# Patient Record
Sex: Female | Born: 1995
Health system: Southern US, Community
[De-identification: ages and names within clinical notes are randomized; demographics above are authoritative.]

## PROBLEM LIST (undated history)

## (undated) ENCOUNTER — Inpatient Hospital Stay (HOSPITAL_COMMUNITY): Payer: Self-pay

## (undated) DIAGNOSIS — O039 Complete or unspecified spontaneous abortion without complication: Secondary | ICD-10-CM

## (undated) DIAGNOSIS — S83106A Unspecified dislocation of unspecified knee, initial encounter: Secondary | ICD-10-CM

## (undated) DIAGNOSIS — F419 Anxiety disorder, unspecified: Secondary | ICD-10-CM

## (undated) HISTORY — PX: NO PAST SURGERIES: SHX2092

---
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
## 2016-05-03 ENCOUNTER — Encounter: Payer: Self-pay | Admitting: *Deleted

## 2016-05-03 ENCOUNTER — Ambulatory Visit (INDEPENDENT_AMBULATORY_CARE_PROVIDER_SITE_OTHER): Payer: Medicaid Other | Admitting: *Deleted

## 2016-05-03 DIAGNOSIS — Z3201 Encounter for pregnancy test, result positive: Secondary | ICD-10-CM | POA: Diagnosis not present

## 2016-05-03 DIAGNOSIS — Z3490 Encounter for supervision of normal pregnancy, unspecified, unspecified trimester: Secondary | ICD-10-CM

## 2016-05-03 LAB — POCT PREGNANCY, URINE: Preg Test, Ur: POSITIVE — AB

## 2016-05-03 NOTE — Progress Notes (Signed)
Patient presenting for pregnancy test which is positive. Reviewed allergies and medications, recommended starting prenatal vitamins. Proof of pregnancy letter given.

## 2016-05-10 ENCOUNTER — Inpatient Hospital Stay (HOSPITAL_COMMUNITY)
Admission: AD | Admit: 2016-05-10 | Discharge: 2016-05-10 | Disposition: A | Discharge status: Home / Self Care | Payer: Medicaid Other | Source: Ambulatory Visit | Attending: Family Medicine | Admitting: Family Medicine

## 2016-05-10 ENCOUNTER — Inpatient Hospital Stay (HOSPITAL_COMMUNITY): Payer: Medicaid Other

## 2016-05-10 ENCOUNTER — Encounter: Payer: Self-pay | Admitting: Student

## 2016-05-10 DIAGNOSIS — Z3A01 Less than 8 weeks gestation of pregnancy: Secondary | ICD-10-CM | POA: Insufficient documentation

## 2016-05-10 DIAGNOSIS — Z8349 Family history of other endocrine, nutritional and metabolic diseases: Secondary | ICD-10-CM | POA: Insufficient documentation

## 2016-05-10 DIAGNOSIS — Z833 Family history of diabetes mellitus: Secondary | ICD-10-CM | POA: Diagnosis not present

## 2016-05-10 DIAGNOSIS — O4691 Antepartum hemorrhage, unspecified, first trimester: Secondary | ICD-10-CM

## 2016-05-10 DIAGNOSIS — O3680X0 Pregnancy with inconclusive fetal viability, not applicable or unspecified: Secondary | ICD-10-CM

## 2016-05-10 DIAGNOSIS — N939 Abnormal uterine and vaginal bleeding, unspecified: Secondary | ICD-10-CM | POA: Diagnosis present

## 2016-05-10 DIAGNOSIS — O200 Threatened abortion: Secondary | ICD-10-CM | POA: Diagnosis not present

## 2016-05-10 DIAGNOSIS — O209 Hemorrhage in early pregnancy, unspecified: Secondary | ICD-10-CM

## 2016-05-10 HISTORY — DX: Anxiety disorder, unspecified: F41.9

## 2016-05-10 LAB — CBC
HCT: 33.8 % — ABNORMAL LOW (ref 36.0–46.0)
HEMOGLOBIN: 11.2 g/dL — AB (ref 12.0–15.0)
MCH: 26.9 pg (ref 26.0–34.0)
MCHC: 33.1 g/dL (ref 30.0–36.0)
MCV: 81.3 fL (ref 78.0–100.0)
PLATELETS: 286 10*3/uL (ref 150–400)
RBC: 4.16 MIL/uL (ref 3.87–5.11)
RDW: 15.1 % (ref 11.5–15.5)
WBC: 4.9 10*3/uL (ref 4.0–10.5)

## 2016-05-10 LAB — URINE MICROSCOPIC-ADD ON

## 2016-05-10 LAB — WET PREP, GENITAL
Sperm: NONE SEEN
Trich, Wet Prep: NONE SEEN
WBC, Wet Prep HPF POC: NONE SEEN
Yeast Wet Prep HPF POC: NONE SEEN

## 2016-05-10 LAB — URINALYSIS, ROUTINE W REFLEX MICROSCOPIC
Glucose, UA: NEGATIVE mg/dL
Ketones, ur: NEGATIVE mg/dL
Leukocytes, UA: NEGATIVE
NITRITE: NEGATIVE
PH: 7 (ref 5.0–8.0)
PROTEIN: 100 mg/dL — AB
SPECIFIC GRAVITY, URINE: 1.025 (ref 1.005–1.030)

## 2016-05-10 LAB — HCG, QUANTITATIVE, PREGNANCY: HCG, BETA CHAIN, QUANT, S: 3529 m[IU]/mL — AB (ref <5)

## 2016-05-10 LAB — ABO/RH: ABO/RH(D): B POS

## 2016-05-10 NOTE — MAU Note (Signed)
Pt states she used the restroom earlier today & noted pink/red bleeding on tissue.  Denies pain.

## 2016-05-10 NOTE — Progress Notes (Signed)
Erin Lawrence NP in earlier to discuss test results and d/c plan. Written and verbal d/c instructions given and understanding voiced 

## 2016-05-10 NOTE — Discharge Instructions (Signed)
Vaginal Bleeding During Pregnancy, First Trimester °A small amount of bleeding (spotting) from the vagina is relatively common in early pregnancy. It usually stops on its own. Various things may cause bleeding or spotting in early pregnancy. Some bleeding may be related to the pregnancy, and some may not. In most cases, the bleeding is normal and is not a problem. However, bleeding can also be a sign of something serious. Be sure to tell your health care provider about any vaginal bleeding right away. °Some possible causes of vaginal bleeding during the first trimester include: °· Infection or inflammation of the cervix. °· Growths (polyps) on the cervix. °· Miscarriage or threatened miscarriage. °· Pregnancy tissue has developed outside of the uterus and in a fallopian tube (tubal pregnancy). °· Tiny cysts have developed in the uterus instead of pregnancy tissue (molar pregnancy). °HOME CARE INSTRUCTIONS  °Watch your condition for any changes. The following actions may help to lessen any discomfort you are feeling: °· Follow your health care provider's instructions for limiting your activity. If your health care provider orders bed rest, you may need to stay in bed and only get up to use the bathroom. However, your health care provider may allow you to continue light activity. °· If needed, make plans for someone to help with your regular activities and responsibilities while you are on bed rest. °· Keep track of the number of pads you use each day, how often you change pads, and how soaked (saturated) they are. Write this down. °· Do not use tampons. Do not douche. °· Do not have sexual intercourse or orgasms until approved by your health care provider. °· If you pass any tissue from your vagina, save the tissue so you can show it to your health care provider. °· Only take over-the-counter or prescription medicines as directed by your health care provider. °· Do not take aspirin because it can make you  bleed. °· Keep all follow-up appointments as directed by your health care provider. °SEEK MEDICAL CARE IF: °· You have any vaginal bleeding during any part of your pregnancy. °· You have cramps or labor pains. °· You have a fever, not controlled by medicine. °SEEK IMMEDIATE MEDICAL CARE IF:  °· You have severe cramps in your back or belly (abdomen). °· You pass large clots or tissue from your vagina. °· Your bleeding increases. °· You feel light-headed or weak, or you have fainting episodes. °· You have chills. °· You are leaking fluid or have a gush of fluid from your vagina. °· You pass out while having a bowel movement. °MAKE SURE YOU: °· Understand these instructions. °· Will watch your condition. °· Will get help right away if you are not doing well or get worse. °  °This information is not intended to replace advice given to you by your health care provider. Make sure you discuss any questions you have with your health care provider. °  °Document Released: 06/01/2005 Document Revised: 08/27/2013 Document Reviewed: 04/29/2013 °Elsevier Interactive Patient Education ©2016 [employer] ° °Pelvic Rest °Pelvic rest is sometimes recommended for women when:  °· The placenta is partially or completely covering the opening of the cervix (placenta previa). °· There is bleeding between the uterine wall and the amniotic sac in the first trimester (subchorionic hemorrhage). °· The cervix begins to open without labor starting (incompetent cervix, cervical insufficiency). °· The labor is too early (preterm labor). °HOME CARE INSTRUCTIONS °· Do not have sexual intercourse, stimulation, or an orgasm. °· Do   not use tampons, douche, or put anything in the vagina. °· Do not lift anything over 10 pounds (4.5 kg). °· Avoid strenuous activity or straining your pelvic muscles. °SEEK MEDICAL CARE IF:  °· You have any vaginal bleeding during pregnancy. Treat this as a potential emergency. °· You have cramping pain felt low in the  stomach (stronger than menstrual cramps). °· You notice vaginal discharge (watery, mucus, or bloody). °· You have a low, dull backache. °· There are regular contractions or uterine tightening. °SEEK IMMEDIATE MEDICAL CARE IF: °You have vaginal bleeding and have placenta previa.  °  °This information is not intended to replace advice given to you by your health care provider. Make sure you discuss any questions you have with your health care provider. °  °Document Released: 12/17/2010 Document Revised: 11/14/2011 Document Reviewed: 02/23/2015 °Elsevier Interactive Patient Education ©2016 Elsevier Inc. ° °

## 2016-05-10 NOTE — MAU Provider Note (Signed)
History     CSN: [id number]  Arrival date and time: 05/10/16 1158   First Provider Initiated Contact with Patient 05/10/16 1358      Chief Complaint  Patient presents with  . Vaginal Bleeding   HPI  Phillipa Morden is a 20 y.o. G1P0 at [redacted]w[redacted]d by LMP who presents with vaginal bleeding & abdominal cramping. Symptoms began this morning. Vaginal bleeding was initially pink spotting on toilet paper but she is now having to wear a pad. Describes as bright red bleeding; no clots. Lower abdominal cramping this morning. Describes as period like cramps. Rates pain 3/10. Has not treated.  Some nausea, no vomiting.  Denies diarrhea, constipation, vaginal discharge, or dysuria. Last intercourse yesterday.    OB History    Gravida Para Term Preterm AB Living   1             SAB TAB Ectopic Multiple Live Births                  Past Medical History:  Diagnosis Date  . Anxiety   . Dislocated knee 2008   R knee    Past Surgical History:  Procedure Laterality Date  . NO PAST SURGERIES      Family History  Problem Relation Age of Onset  . Diabetes Mother   . Thyroid disease Mother     Social History  Substance Use Topics  . Smoking status: Never Smoker  . Smokeless tobacco: Never Used  . Alcohol use No    Allergies: No Known Allergies  Prescriptions Prior to Admission  Medication Sig Dispense Refill Last Dose  . Prenatal Vit-Fe Fumarate-FA (PRENATAL MULTIVITAMIN) TABS tablet Take 1 tablet by mouth daily at 12 noon.   05/09/2016 at Unknown time  . ciprofloxacin (CIPRO) 500 MG tablet Take 1 tablet (500 mg total) by mouth 2 (two) times daily. (Patient not taking: Reported on 05/03/2016) 6 tablet 0 Not Taking  . cyclobenzaprine (FLEXERIL) 5 MG tablet Take 1 tablet (5 mg total) by mouth 3 (three) times daily as needed for muscle spasms. (Patient not taking: Reported on 05/03/2016) 30 tablet 0 Not Taking  . ibuprofen (ADVIL,MOTRIN) 600 MG tablet Take 1 tablet (600 mg total) by mouth every 6  (six) hours as needed. (Patient not taking: Reported on 05/03/2016) 30 tablet 0 Not Taking  . phenazopyridine (PYRIDIUM) 100 MG tablet Take 1 tablet (100 mg total) by mouth 3 (three) times daily as needed for pain. (Patient not taking: Reported on 05/03/2016) 10 tablet 0 Not Taking    Review of Systems  Constitutional: Negative.   Gastrointestinal: Positive for abdominal pain and nausea. Negative for constipation, diarrhea and vomiting.  Genitourinary: Negative for dysuria.       + vaginal bleeding   Physical Exam   Blood pressure 116/77, pulse 84, temperature 98.1 F (36.7 C), temperature source Oral, resp. rate 16, height 5\' 2"  (1.575 m), weight 131 lb (59.4 kg), last menstrual period 04/04/2016.  Physical Exam  Nursing note and vitals reviewed. Constitutional: She is oriented to person, place, and time. She appears well-developed and well-nourished. No distress.  HENT:  Head: Normocephalic and atraumatic.  Eyes: Conjunctivae are normal. Right eye exhibits no discharge. Left eye exhibits no discharge. No scleral icterus.  Neck: Normal range of motion.  Cardiovascular: Normal rate.   Respiratory: Effort normal. No respiratory distress.  GI: Soft. She exhibits no distension. There is no tenderness. There is no rebound.  Genitourinary: Uterus normal. Cervix exhibits no motion tenderness.  Right adnexum displays no mass and no tenderness. Left adnexum displays no mass and no tenderness. There is bleeding (small amount of dark red blood) in the vagina. No vaginal discharge found.  Genitourinary Comments: Cervix closed  Neurological: She is alert and oriented to person, place, and time.  Skin: Skin is warm and dry. She is not diaphoretic.  Psychiatric: She has a normal mood and affect. Her behavior is normal. Judgment and thought content normal.    MAU Course  Procedures Results for orders placed or performed during the hospital encounter of 05/10/16 (from the past 24 hour(s))   Urinalysis, Routine w reflex microscopic (not at [facility])     Status: Abnormal   Collection Time: 05/10/16 12:11 PM  Result Value Ref Range   Color, Urine AMBER (A) YELLOW   APPearance HAZY (A) CLEAR   Specific Gravity, Urine 1.025 1.005 - 1.030   pH 7.0 5.0 - 8.0   Glucose, UA NEGATIVE NEGATIVE mg/dL   Hgb urine dipstick LARGE (A) NEGATIVE   Bilirubin Urine SMALL (A) NEGATIVE   Ketones, ur NEGATIVE NEGATIVE mg/dL   Protein, ur 161 (A) NEGATIVE mg/dL   Nitrite NEGATIVE NEGATIVE   Leukocytes, UA NEGATIVE NEGATIVE  Urine microscopic-add on     Status: Abnormal   Collection Time: 05/10/16 12:11 PM  Result Value Ref Range   Squamous Epithelial / LPF 6-30 (A) NONE SEEN   WBC, UA 0-5 0 - 5 WBC/hpf   RBC / HPF TOO NUMEROUS TO COUNT 0 - 5 RBC/hpf   Bacteria, UA MANY (A) NONE SEEN  CBC     Status: Abnormal   Collection Time: 05/10/16  1:00 PM  Result Value Ref Range   WBC 4.9 4.0 - 10.5 K/uL   RBC 4.16 3.87 - 5.11 MIL/uL   Hemoglobin 11.2 (L) 12.0 - 15.0 g/dL   HCT 09.6 (L) 04.5 - 40.9 %   MCV 81.3 78.0 - 100.0 fL   MCH 26.9 26.0 - 34.0 pg   MCHC 33.1 30.0 - 36.0 g/dL   RDW 81.1 91.4 - 78.2 %   Platelets 286 150 - 400 K/uL  ABO/Rh     Status: None   Collection Time: 05/10/16  1:00 PM  Result Value Ref Range   ABO/RH(D) B POS   hCG, quantitative, pregnancy     Status: Abnormal   Collection Time: 05/10/16  1:00 PM  Result Value Ref Range   hCG, Beta Chain, Quant, S 3,529 (H) <5 mIU/mL  Wet prep, genital     Status: Abnormal   Collection Time: 05/10/16  2:00 PM  Result Value Ref Range   Yeast Wet Prep HPF POC NONE SEEN NONE SEEN   Trich, Wet Prep NONE SEEN NONE SEEN   Clue Cells Wet Prep HPF POC PRESENT (A) NONE SEEN   WBC, Wet Prep HPF POC NONE SEEN NONE SEEN   Sperm NONE SEEN    US Ob Comp Less 14 Wks  Result Date: 05/10/2016 CLINICAL DATA:  Spotting for 1 day. EXAM: OBSTETRIC <14 WK ULTRASOUND TECHNIQUE: Transabdominal ultrasound was performed for evaluation of the gestation  as well as the maternal uterus and adnexal regions. COMPARISON:  None. FINDINGS: Intrauterine gestational sac: Single Yolk sac:  Not visualized Embryo:  Not visualized Cardiac Activity: Not visualized Heart Rate:  bpm MSD: 5.9  mm   5 w   2  d CRL:     mm    w  d  US EDC: Subchorionic hemorrhage:  None visualized. Maternal uterus/adnexae: Corpus luteal cyst on the right. No adnexal masses. Trace free fluid. IMPRESSION: Early intrauterine gestational sac. No yolk sac or fetal pole currently. This could be followed with repeat ultrasound in 10-14 days to ensure expected progression. Electronically Signed   By: Charlett NoseKevin  Dover M.D.   On: 05/10/2016 14:58   [place] Ob Transvaginal  Result Date: 05/10/2016 CLINICAL DATA:  Spotting for 1 day. EXAM: OBSTETRIC <14 WK ULTRASOUND TECHNIQUE: Transabdominal ultrasound was performed for evaluation of the gestation as well as the maternal uterus and adnexal regions. COMPARISON:  None. FINDINGS: Intrauterine gestational sac: Single Yolk sac:  Not visualized Embryo:  Not visualized Cardiac Activity: Not visualized Heart Rate:  bpm MSD: 5.9  mm   5 w   2  d CRL:     mm    w  d                  US EDC: Subchorionic hemorrhage:  None visualized. Maternal uterus/adnexae: Corpus luteal cyst on the right. No adnexal masses. Trace free fluid. IMPRESSION: Early intrauterine gestational sac. No yolk sac or fetal pole currently. This could be followed with repeat ultrasound in 10-14 days to ensure expected progression. Electronically Signed   By: Charlett NoseKevin  Dover M.D.   On: 05/10/2016 14:58    MDM +UPT UA, wet prep, GC/chlamydia, CBC, ABO/Rh, quant hCG, HIV, and US today to rule out ectopic pregnancy B positive Ultrasound shows IUGS, no yolk sac. Small SCH.   Assessment and Plan  A: 1. Pregnancy of unknown anatomic location   2. Vaginal bleeding in pregnancy, first trimester   3. Threatened miscarriage    P; [facility] home [facility]  BHCG Pelvic rest Discussed reasons to return to MAU GC/CT pending  Judeth Hornrin Brookelynne Dimperio 05/10/2016, 1:58 PM

## 2016-05-10 NOTE — Progress Notes (Signed)
Has been depressed recently but does not want to harm self or others.

## 2016-05-10 NOTE — Progress Notes (Signed)
Pt now states is not depressed but had anxiety attack this summer

## 2016-05-11 LAB — GC/CHLAMYDIA PROBE AMP (~~LOC~~) NOT AT ARMC
CHLAMYDIA, DNA PROBE: NEGATIVE
Neisseria Gonorrhea: NEGATIVE

## 2016-05-11 LAB — HIV ANTIBODY (ROUTINE TESTING W REFLEX): HIV Screen 4th Generation wRfx: NONREACTIVE

## 2016-05-12 ENCOUNTER — Other Ambulatory Visit: Payer: Medicaid Other

## 2016-05-12 DIAGNOSIS — O3680X0 Pregnancy with inconclusive fetal viability, not applicable or unspecified: Secondary | ICD-10-CM

## 2016-05-12 LAB — HCG, QUANTITATIVE, PREGNANCY: hCG, Beta Chain, Quant, S: 5630 m[IU]/mL — ABNORMAL HIGH (ref <5)

## 2016-05-12 NOTE — Progress Notes (Unsigned)
Pt had stat hcg level today. Reviewed with Dr. Adrian BlackwaterStinson who recommended that patient have a repeat ultrasound in 10-14 days. Ultrasound scheduled and patient informed of results. She had no further questions at this time.

## 2016-05-19 ENCOUNTER — Inpatient Hospital Stay (HOSPITAL_COMMUNITY): Payer: Medicaid Other

## 2016-05-19 ENCOUNTER — Inpatient Hospital Stay (HOSPITAL_COMMUNITY)
Admission: AD | Admit: 2016-05-19 | Discharge: 2016-05-19 | Disposition: A | Discharge status: Home / Self Care | Payer: Medicaid Other | Source: Ambulatory Visit | Attending: Obstetrics & Gynecology | Admitting: Obstetrics & Gynecology

## 2016-05-19 DIAGNOSIS — O209 Hemorrhage in early pregnancy, unspecified: Secondary | ICD-10-CM

## 2016-05-19 DIAGNOSIS — O200 Threatened abortion: Secondary | ICD-10-CM | POA: Diagnosis not present

## 2016-05-19 DIAGNOSIS — O26899 Other specified pregnancy related conditions, unspecified trimester: Secondary | ICD-10-CM

## 2016-05-19 DIAGNOSIS — R109 Unspecified abdominal pain: Secondary | ICD-10-CM

## 2016-05-19 DIAGNOSIS — Z3A01 Less than 8 weeks gestation of pregnancy: Secondary | ICD-10-CM | POA: Insufficient documentation

## 2016-05-19 LAB — URINE MICROSCOPIC-ADD ON: WBC UA: NONE SEEN WBC/hpf (ref 0–5)

## 2016-05-19 LAB — URINALYSIS, ROUTINE W REFLEX MICROSCOPIC
Bilirubin Urine: NEGATIVE
Glucose, UA: NEGATIVE mg/dL
Ketones, ur: NEGATIVE mg/dL
LEUKOCYTES UA: NEGATIVE
Nitrite: NEGATIVE
PROTEIN: NEGATIVE mg/dL
Specific Gravity, Urine: 1.02 (ref 1.005–1.030)
pH: 7.5 (ref 5.0–8.0)

## 2016-05-19 LAB — CBC
HEMATOCRIT: 33 % — AB (ref 36.0–46.0)
HEMOGLOBIN: 10.9 g/dL — AB (ref 12.0–15.0)
MCH: 26.8 pg (ref 26.0–34.0)
MCHC: 33 g/dL (ref 30.0–36.0)
MCV: 81.3 fL (ref 78.0–100.0)
Platelets: 290 10*3/uL (ref 150–400)
RBC: 4.06 MIL/uL (ref 3.87–5.11)
RDW: 15.5 % (ref 11.5–15.5)
WBC: 5.9 10*3/uL (ref 4.0–10.5)

## 2016-05-19 LAB — HCG, QUANTITATIVE, PREGNANCY: hCG, Beta Chain, Quant, S: 15326 m[IU]/mL — ABNORMAL HIGH (ref <5)

## 2016-05-19 NOTE — MAU Note (Signed)
Pt has been bleeding for a week and two days now, light bleeding now.  Has some abdominal cramping that started yesterday afternoon, rating it a 4/10 today.  She said she passed some tissue this morning when she went to the bathroom.

## 2016-05-19 NOTE — MAU Provider Note (Signed)
History     CSN: [id number]  Arrival date and time: 05/19/16 1119   First Provider Initiated Contact with Patient 05/19/16 1145      Chief Complaint  Patient presents with  . Vaginal Bleeding  . Abdominal Cramping   HPI Erin Ponce is a 20 y.o. G1P0 at [redacted]w[redacted]d by LMP who presents with vaginal bleeding & abdominal cramping. Patient has been followed for vaginal bleeding & pregnancy of unknown anatomy location since 9/5. Reports that the vaginal bleeding was heavier for a day and that she passed a few small clots that she thought may be tissue. VB returned to red spotting since then. Abdominal cramping has since resolved as well.   OB History    Gravida Para Term Preterm AB Living   1             SAB TAB Ectopic Multiple Live Births                  Past Medical History:  Diagnosis Date  . Anxiety   . Dislocated knee 2008   R knee    Past Surgical History:  Procedure Laterality Date  . NO PAST SURGERIES      Family History  Problem Relation Age of Onset  . Diabetes Mother   . Thyroid disease Mother     Social History  Substance Use Topics  . Smoking status: Never Smoker  . Smokeless tobacco: Never Used  . Alcohol use No    Allergies: No Known Allergies  Prescriptions Prior to Admission  Medication Sig Dispense Refill Last Dose  . Prenatal Vit-Fe Fumarate-FA (PRENATAL MULTIVITAMIN) TABS tablet Take 1 tablet by mouth daily at 12 noon.    05/09/2016 at Unknown time    Review of Systems  Constitutional: Negative.   Gastrointestinal: Positive for abdominal pain. Negative for constipation, diarrhea, nausea and vomiting.  Genitourinary: Negative for dysuria.       + vaginal bleeding   Physical Exam   Blood pressure 136/82, pulse 94, temperature 99.2 F (37.3 C), temperature source Oral, resp. rate 17, last menstrual period 04/04/2016.  Physical Exam  Nursing note and vitals reviewed. Constitutional: She is oriented to person, place, and time. She appears  well-developed and well-nourished. No distress.  HENT:  Head: Normocephalic and atraumatic.  Eyes: Conjunctivae are normal. Right eye exhibits no discharge. Left eye exhibits no discharge. No scleral icterus.  Neck: Normal range of motion.  Cardiovascular: Normal rate, regular rhythm and normal heart sounds.   No murmur heard. Respiratory: Effort normal and breath sounds normal. No respiratory distress. She has no wheezes.  GI: Soft. She exhibits no distension. There is no tenderness.  Genitourinary: Right adnexum displays no mass and no tenderness. Left adnexum displays no mass and no tenderness. There is bleeding (small amount of dark red blood, no active bleeding) in the vagina.  Genitourinary Comments: Cervix closed  Neurological: She is alert and oriented to person, place, and time.  Skin: Skin is warm and dry. She is not diaphoretic.  Psychiatric: She has a normal mood and affect. Her behavior is normal. Judgment and thought content normal.    MAU Course  Procedures Results for orders placed or performed during the hospital encounter of 05/19/16 (from the past 24 hour(s))  Urinalysis, Routine w reflex microscopic (not at [facility])     Status: Abnormal   Collection Time: 05/19/16 11:25 AM  Result Value Ref Range   Color, Urine YELLOW YELLOW   APPearance CLEAR CLEAR  Specific Gravity, Urine 1.020 1.005 - 1.030   pH 7.5 5.0 - 8.0   Glucose, UA NEGATIVE NEGATIVE mg/dL   Hgb urine dipstick LARGE (A) NEGATIVE   Bilirubin Urine NEGATIVE NEGATIVE   Ketones, ur NEGATIVE NEGATIVE mg/dL   Protein, ur NEGATIVE NEGATIVE mg/dL   Nitrite NEGATIVE NEGATIVE   Leukocytes, UA NEGATIVE NEGATIVE  Urine microscopic-add on     Status: Abnormal   Collection Time: 05/19/16 11:25 AM  Result Value Ref Range   Squamous Epithelial / LPF 6-30 (A) NONE SEEN   WBC, UA NONE SEEN 0 - 5 WBC/hpf   RBC / HPF 0-5 0 - 5 RBC/hpf   Bacteria, UA MANY (A) NONE SEEN   Urine-Other MUCOUS PRESENT   CBC     Status:  Abnormal   Collection Time: 05/19/16 12:04 PM  Result Value Ref Range   WBC 5.9 4.0 - 10.5 K/uL   RBC 4.06 3.87 - 5.11 MIL/uL   Hemoglobin 10.9 (L) 12.0 - 15.0 g/dL   HCT 30.833.0 (L) 65.736.0 - 84.646.0 %   MCV 81.3 78.0 - 100.0 fL   MCH 26.8 26.0 - 34.0 pg   MCHC 33.0 30.0 - 36.0 g/dL   RDW 96.215.5 95.211.5 - 84.115.5 %   Platelets 290 150 - 400 K/uL  hCG, quantitative, pregnancy     Status: Abnormal   Collection Time: 05/19/16 12:04 PM  Result Value Ref Range   hCG, Beta Chain, Quant, S 15,326 (H) <5 mIU/mL   [place] Ob Transvaginal  Result Date: 05/19/2016 CLINICAL DATA:  Vaginal bleeding for multiple weeks, getting worse. Six weeks and 3 days pregnant by last menstrual period. Quantitative beta HCG 15,326. EXAM: TRANSVAGINAL OB ULTRASOUND TECHNIQUE: Transvaginal ultrasound was performed for complete evaluation of the gestation as well as the maternal uterus, adnexal regions, and pelvic cul-de-sac. COMPARISON:  05/10/2016. FINDINGS: Intrauterine gestational sac: Visualized. Yolk sac:  Not visualized Embryo:  Not visualized MSD: 10.1  mm   5 w   5  d           US EDC: 01/08/2017 Subchorionic hemorrhage: Small amount of fluid containing internal membranes within the endometrium on the left extending adjacent to the gestational sac on the right. Maternal uterus/adnexae: Right ovarian corpus luteum. Normal appearing left ovary. Trace amount of free peritoneal fluid. IMPRESSION: 1. Intrauterine gestational sac on the right with no visible fetal pole. Based on sac measurements, the estimated gestational age is 5 weeks and 5 days. This could represent a normal, early intrauterine gestation. Recommend follow-up quantitative B-HCG levels and follow-up US in 14 days to confirm and assess viability. This recommendation follows SRU consensus guidelines: Diagnostic Criteria for Nonviable Pregnancy Early in the First Trimester. Malva Limes Engl J Med 2013; 324:4010-27; 369:1443-51. 2. Small complicated fluid collection within the endometrial cavity to the  left of the gestational sac. This could represent a laterally extending subchorionic hemorrhage. Electronically Signed   By: Beckie SaltsSteven  Reid M.D.   On: 05/19/2016 14:29    MDM CBC, BHCG, ultrasound VSS, NAD B positive Cervix closed Ultrasound shows IUGS that has grown in size since previous study; no yolk sac; possible SCH. BHCG 15,326 Reviewed ultrasound & labs with Dr. Macon LargeAnyanwu; ok to discharge home; will reschedule f/u ultrasound for 10 days  Assessment and Plan  A: 1. Threatened miscarriage   2. Vaginal bleeding in pregnancy, first trimester   3. Abdominal pain affecting pregnancy     P: Discharge home Pelvic rest Reschedule outpatient ultrasound for the week of 9/25 Discussed  reasons to return to MAU Msg sent to clinic for f/u appt to discuss u/s result  Judeth Horn 05/19/2016, 11:44 AM

## 2016-05-19 NOTE — Discharge Instructions (Signed)
Return to care   If you have heavier bleeding that soaks through more that 2 pads per hour for an hour or more  If you bleed so much that you feel like you might pass out or you do pass out  If you have significant abdominal pain that is not improved with Tylenol   If you develop a fever > 100.5    Threatened Miscarriage A threatened miscarriage occurs when you have vaginal bleeding during your first 20 weeks of pregnancy but the pregnancy has not ended. If you have vaginal bleeding during this time, your health care provider will do tests to make sure you are still pregnant. If the tests show you are still pregnant and the developing baby (fetus) inside your womb (uterus) is still growing, your condition is considered a threatened miscarriage. A threatened miscarriage does not mean your pregnancy will end, but it does increase the risk of losing your pregnancy (complete miscarriage). CAUSES  The cause of a threatened miscarriage is usually not known. If you go on to have a complete miscarriage, the most common cause is an abnormal number of chromosomes in the developing baby. Chromosomes are the structures inside cells that hold all your genetic material. Some causes of vaginal bleeding that do not result in miscarriage include:  Having sex.  Having an infection.  Normal hormone changes of pregnancy.  Bleeding that occurs when an egg implants in your uterus. RISK FACTORS Risk factors for bleeding in early pregnancy include:  Obesity.  Smoking.  Drinking excessive amounts of alcohol or caffeine.  Recreational drug use. SIGNS AND SYMPTOMS  Light vaginal bleeding.  Mild abdominal pain or cramps. DIAGNOSIS  If you have bleeding with or without abdominal pain before 20 weeks of pregnancy, your health care provider will do tests to check whether you are still pregnant. One important test involves using sound waves and a computer (ultrasound) to create images of the inside of your  uterus. Other tests include an internal exam of your vagina and uterus (pelvic exam) and measurement of your baby's heart rate.  You may be diagnosed with a threatened miscarriage if:  Ultrasound testing shows you are still pregnant.  Your baby's heart rate is strong.  A pelvic exam shows that the opening between your uterus and your vagina (cervix) is closed.  Your heart rate and blood pressure are stable.  Blood tests confirm you are still pregnant. TREATMENT  No treatments have been shown to prevent a threatened miscarriage from going on to a complete miscarriage. However, the right home care is important.  HOME CARE INSTRUCTIONS   Make sure you keep all your appointments for prenatal care. This is very important.  Get plenty of rest.  Do not have sex or use tampons if you have vaginal bleeding.  Do not douche.  Do not smoke or use recreational drugs.  Do not drink alcohol.  Avoid caffeine. SEEK MEDICAL CARE IF:  You have light vaginal bleeding or spotting while pregnant.  You have abdominal pain or cramping.  You have a fever. SEEK IMMEDIATE MEDICAL CARE IF:  You have heavy vaginal bleeding.  You have blood clots coming from your vagina.  You have severe low back pain or abdominal cramps.  You have fever, chills, and severe abdominal pain. MAKE SURE YOU:  Understand these instructions.  Will watch your condition.  Will get help right away if you are not doing well or get worse.   This information is not intended to replace  advice given to you by your health care provider. Make sure you discuss any questions you have with your health care provider.   Document Released: 08/22/2005 Document Revised: 08/27/2013 Document Reviewed: 06/18/2013 Elsevier Interactive Patient Education 2016 Elsevier Inc.    Pelvic Rest Pelvic rest is sometimes recommended for women when:   The placenta is partially or completely covering the opening of the cervix (placenta  previa).  There is bleeding between the uterine wall and the amniotic sac in the first trimester (subchorionic hemorrhage).  The cervix begins to open without labor starting (incompetent cervix, cervical insufficiency).  The labor is too early (preterm labor). HOME CARE INSTRUCTIONS  Do not have sexual intercourse, stimulation, or an orgasm.  Do not use tampons, douche, or put anything in the vagina.  Do not lift anything over 10 pounds (4.5 kg).  Avoid strenuous activity or straining your pelvic muscles. SEEK MEDICAL CARE IF:  You have any vaginal bleeding during pregnancy. Treat this as a potential emergency.  You have cramping pain felt low in the stomach (stronger than menstrual cramps).  You notice vaginal discharge (watery, mucus, or bloody).  You have a low, dull backache.  There are regular contractions or uterine tightening. SEEK IMMEDIATE MEDICAL CARE IF: You have vaginal bleeding and have placenta previa.    This information is not intended to replace advice given to you by your health care provider. Make sure you discuss any questions you have with your health care provider.   Document Released: 12/17/2010 Document Revised: 11/14/2011 Document Reviewed: 02/23/2015 Elsevier Interactive Patient Education [employer]

## 2016-05-23 ENCOUNTER — Ambulatory Visit (HOSPITAL_COMMUNITY): Payer: Medicaid Other

## 2016-05-24 ENCOUNTER — Ambulatory Visit (HOSPITAL_COMMUNITY): Admission: RE | Admit: 2016-05-24 | Payer: Medicaid Other | Source: Ambulatory Visit

## 2016-05-30 ENCOUNTER — Ambulatory Visit (HOSPITAL_COMMUNITY)
Admission: RE | Admit: 2016-05-30 | Discharge: 2016-05-30 | Disposition: A | Discharge status: Home / Self Care | Payer: Medicaid Other | Source: Ambulatory Visit | Attending: Student | Admitting: Student

## 2016-05-30 ENCOUNTER — Ambulatory Visit: Payer: Medicaid Other | Admitting: *Deleted

## 2016-05-30 DIAGNOSIS — O26899 Other specified pregnancy related conditions, unspecified trimester: Secondary | ICD-10-CM

## 2016-05-30 DIAGNOSIS — O200 Threatened abortion: Secondary | ICD-10-CM

## 2016-05-30 DIAGNOSIS — O209 Hemorrhage in early pregnancy, unspecified: Secondary | ICD-10-CM

## 2016-05-30 DIAGNOSIS — O283 Abnormal ultrasonic finding on antenatal screening of mother: Secondary | ICD-10-CM | POA: Diagnosis not present

## 2016-05-30 DIAGNOSIS — Z3A01 Less than 8 weeks gestation of pregnancy: Secondary | ICD-10-CM | POA: Insufficient documentation

## 2016-05-30 DIAGNOSIS — Z3200 Encounter for pregnancy test, result unknown: Secondary | ICD-10-CM

## 2016-05-30 DIAGNOSIS — R109 Unspecified abdominal pain: Secondary | ICD-10-CM

## 2016-05-30 NOTE — Progress Notes (Signed)
Patient presenting for f/u ultrasound. Transvaginal u/s complete, shows gestational sac but no yolk sac, fetal pole, or fht. Consulted with Dr Vergie LivingPickens who recommended f/u u/s in 14 days and see a provider afterwards. Discussed this with patient who voiced understanding. U/s scheduled for 10/9 @ 0900.

## 2016-06-13 ENCOUNTER — Emergency Department (HOSPITAL_COMMUNITY): Payer: Medicaid Other

## 2016-06-13 ENCOUNTER — Ambulatory Visit: Payer: Medicaid Other | Admitting: Family Medicine

## 2016-06-13 ENCOUNTER — Ambulatory Visit (HOSPITAL_COMMUNITY): Admission: RE | Admit: 2016-06-13 | Payer: Medicaid Other | Source: Ambulatory Visit

## 2016-06-13 ENCOUNTER — Encounter (HOSPITAL_COMMUNITY): Payer: Self-pay

## 2016-06-13 ENCOUNTER — Emergency Department (HOSPITAL_COMMUNITY)
Admission: EM | Admit: 2016-06-13 | Discharge: 2016-06-13 | Disposition: A | Discharge status: Home / Self Care | Payer: Medicaid Other | Attending: Emergency Medicine | Admitting: Emergency Medicine

## 2016-06-13 DIAGNOSIS — O039 Complete or unspecified spontaneous abortion without complication: Secondary | ICD-10-CM | POA: Diagnosis not present

## 2016-06-13 DIAGNOSIS — Z3A10 10 weeks gestation of pregnancy: Secondary | ICD-10-CM | POA: Diagnosis not present

## 2016-06-13 DIAGNOSIS — O26899 Other specified pregnancy related conditions, unspecified trimester: Secondary | ICD-10-CM

## 2016-06-13 DIAGNOSIS — O209 Hemorrhage in early pregnancy, unspecified: Secondary | ICD-10-CM

## 2016-06-13 DIAGNOSIS — R102 Pelvic and perineal pain: Secondary | ICD-10-CM

## 2016-06-13 MED ORDER — IBUPROFEN 200 MG PO TABS
600.0000 mg | ORAL_TABLET | Freq: Once | ORAL | Status: AC
  Administered 2016-06-13: 600 mg via ORAL
  Filled 2016-06-13: qty 1

## 2016-06-13 MED ORDER — NAPROXEN 500 MG PO TABS: 500.0000 mg | ORAL_TABLET | Freq: Two times a day (BID) | ORAL | 0 refills | Status: DC

## 2016-06-13 NOTE — Discharge Instructions (Signed)
Your ultrasound tonight shows that you have passed the pregnancy. You will need to follow up in the GYN office at [facility] in 2 weeks. They will call you to schedule the appointment. If they have not called you by the end of the week you should call them. If you have problems before then go to the Maternity Admissions area (their emergency room)

## 2016-06-13 NOTE — ED Provider Notes (Signed)
[facility]-EMERGENCY DEPT Provider Note   CSN: [id number] Arrival date & time: 06/13/16  0005     History   Chief Complaint Chief Complaint  Patient presents with  . Abdominal Pain    HPI Erin Ponce is a 20 y.o. G1P0 @ [redacted]w[redacted]d gestation who presents to the ED with abdominal pain and vaginal bleeding. She was evaluated at @ [facility] on 2 occasions for pain in early pregnancy. The first visit 05/19/16 she had an ultrasound that showed a 5 week 5 days IUGS. She returned on 9/25 and the ultrasound showed a 6 week 2 day IUGS but no YS. She is scheduled for f/u ultrasound today at [facility] but due to heavy bleeding and cramping she came to the ED tonight. [name] bleeding heavier than and period and passing clots.   Vaginal Bleeding  Primary symptoms include vaginal bleeding.  Primary symptoms include no dysuria. There has been no fever. This is a new problem. The current episode started 1 to 2 hours ago. The problem occurs constantly. The symptoms occur spontaneously. Her LMP was months ago. Associated symptoms include abdominal pain. Pertinent negatives include no nausea, no vomiting and no light-headedness. She has tried nothing for the symptoms.    Past Medical History:  Diagnosis Date  . Anxiety   . Dislocated knee 2008   R knee    There are no active problems to display for this patient.   Past Surgical History:  Procedure Laterality Date  . NO PAST SURGERIES      OB History    Gravida Para Term Preterm AB Living   1             SAB TAB Ectopic Multiple Live Births                   Home Medications    Prior to Admission medications   Medication Sig Start Date End Date Taking? Authorizing Provider  naproxen (NAPROSYN) 500 MG tablet Take 1 tablet (500 mg total) by mouth 2 (two) times daily. 06/13/16   Billye Pickerel Orlene Och, NP  Prenatal Vit-Fe Fumarate-FA (PRENATAL MULTIVITAMIN) TABS tablet Take 1 tablet by mouth daily at 12 noon.     Historical Provider, MD     Family History Family History  Problem Relation Age of Onset  . Diabetes Mother   . Thyroid disease Mother     Social History Social History  Substance Use Topics  . Smoking status: Never Smoker  . Smokeless tobacco: Never Used  . Alcohol use No     Allergies   Review of patient's allergies indicates no known allergies.   Review of Systems Review of Systems  Constitutional: Negative for chills and fever.  HENT: Negative.   Gastrointestinal: Positive for abdominal pain. Negative for nausea and vomiting.  Genitourinary: Positive for vaginal bleeding. Negative for dysuria.  Musculoskeletal: Negative for back pain.  Skin: Negative for rash.  Neurological: Negative for syncope and light-headedness.  Psychiatric/Behavioral: Negative for confusion.     Physical Exam Updated Vital Signs BP 121/62   Pulse 68   Temp 98.7 F (37.1 C) (Oral)   Resp 16   Ht 5\' 2"  (1.575 m)   Wt 60.3 kg   LMP 04/04/2016 (Exact Date)   SpO2 100%   BMI 24.33 kg/m   Physical Exam  Constitutional: She is oriented to person, place, and time. She appears well-developed and well-nourished. No distress.  Eyes: EOM are normal.  Neck: Neck supple.  Cardiovascular: Normal  rate.   Pulmonary/Chest: Effort normal.  Abdominal: Soft. There is tenderness.  Tender with palpation in the lower abdomen.   Genitourinary:  Genitourinary Comments: External genitalia without lesions, moderate blood vaginal vault, tissue removed from the vault, mild CMT, bilateral adnexal tenderness, uterus without palpable enlargement.   Musculoskeletal: Normal range of motion.  Neurological: She is alert and oriented to person, place, and time. No cranial nerve deficit.  Skin: Skin is warm and dry.  Psychiatric: She has a normal mood and affect. Her behavior is normal.  Nursing note and vitals reviewed.  On arrival to the exam room patient pass what appeared as tissue and blood clots.   ED Treatments / Results   Labs (all labs ordered are listed, but only abnormal results are displayed) Labs Reviewed - No data to display  Radiology US Ob Comp Less 14 Wks  Result Date: 06/13/2016 CLINICAL DATA:  Pregnant patient in first-trimester pregnancy with pelvic pain and vaginal bleeding. EXAM: OBSTETRIC <14 WK [place] AND TRANSVAGINAL OB US TECHNIQUE: Both transabdominal and transvaginal ultrasound examinations were performed for complete evaluation of the gestation as well as the maternal uterus, adnexal regions, and pelvic cul-de-sac. Transvaginal technique was performed to assess early pregnancy. COMPARISON:  Obstetric ultrasound 05/30/2016 as well as prior exams FINDINGS: Intrauterine gestational sac: No longer visualized. Yolk sac:  Not visualized. Embryo:  Not visualized. Maternal uterus/adnexae: Previous intrauterine gestational sac is no longer seen. Endometrium measures 4.7 mm greatest dimension. No significant fluid in the endometrial canal. No endometrial blood flow. Complex cyst in the right ovary measures 1.2 cm, similar in size and appearance from prior exam. The left ovary is normal. No significant pelvic free fluid. IMPRESSION: Findings consistent with failed pregnancy. Previous intrauterine gestational sac is not seen. No abnormal endometrial thickening or hyperemia to suggest retained products. Electronically Signed   By: Rubye Oaks M.D.   On: 06/13/2016 02:24   US Ob Transvaginal  Result Date: 06/13/2016 CLINICAL DATA:  Pregnant patient in first-trimester pregnancy with pelvic pain and vaginal bleeding. EXAM: OBSTETRIC <14 WK [place] AND TRANSVAGINAL OB US TECHNIQUE: Both transabdominal and transvaginal ultrasound examinations were performed for complete evaluation of the gestation as well as the maternal uterus, adnexal regions, and pelvic cul-de-sac. Transvaginal technique was performed to assess early pregnancy. COMPARISON:  Obstetric ultrasound 05/30/2016 as well as prior exams FINDINGS: Intrauterine  gestational sac: No longer visualized. Yolk sac:  Not visualized. Embryo:  Not visualized. Maternal uterus/adnexae: Previous intrauterine gestational sac is no longer seen. Endometrium measures 4.7 mm greatest dimension. No significant fluid in the endometrial canal. No endometrial blood flow. Complex cyst in the right ovary measures 1.2 cm, similar in size and appearance from prior exam. The left ovary is normal. No significant pelvic free fluid. IMPRESSION: Findings consistent with failed pregnancy. Previous intrauterine gestational sac is not seen. No abnormal endometrial thickening or hyperemia to suggest retained products. Electronically Signed   By: Rubye Oaks M.D.   On: 06/13/2016 02:24    Procedures Procedures (including critical care time)  Medications Ordered in ED Medications  ibuprofen (ADVIL,MOTRIN) tablet 600 mg (600 mg Oral Given 06/13/16 0231)   [name] Rh positive Discussed with Thressa Sheller, CNM @ [facility] hospital and patient will f/u there in 2 weeks. She will go there sooner for any problems.   Initial Impression / Assessment and Plan / ED Course  I have reviewed the triage vital signs and the nursing notes.  Pertinent labs & imaging results that were available during  my care of the patient were reviewed by me and considered in my medical decision making (see chart for details).  Clinical Course    Final Clinical Impressions(s) / ED Diagnoses  20 y.o. female with vaginal bleeding in early pregnancy stable for d/c without dizziness and complete SAB on U/S. Discussed with the patient clinical and ultrasound findings and plan of care and all questions fully answered. She agrees with plan.  Final diagnoses:  Complete miscarriage    New Prescriptions New Prescriptions   NAPROXEN (NAPROSYN) 500 MG TABLET    Take 1 tablet (500 mg total) by mouth 2 (two) times daily.     FaisonHope M Xiana Carns, NP 06/13/16 0255    Nira ConnPedro Eduardo Cardama, MD 06/13/16 [id number]

## 2016-06-13 NOTE — ED Triage Notes (Signed)
Pt states that she is [redacted]wk pregnant and stated having bleeding yesterday, bleeding got worse today and abd cramping started suddenly around 1130, some nausea, no vomiting.

## 2016-06-22 ENCOUNTER — Encounter: Payer: Medicaid Other | Admitting: Family

## 2016-06-24 ENCOUNTER — Telehealth: Payer: Self-pay | Admitting: *Deleted

## 2016-06-24 NOTE — Telephone Encounter (Signed)
Returned patient's call. Patient stated she is still having severe cramping, intermittent vaginal bleeding with "gushes" of blood. I told her that at this point the best thing to do is go to the MAU and be evaluated. Patient voiced understanding, stated she is waiting for someone to come home and then she would leave immediately.

## 2016-06-24 NOTE — Telephone Encounter (Signed)
Patient called, left message. Stated had [facility] a week ago, she feels like they ought to have done a D&C. She is still cramping and wants to know if this is normal. [facility] app[facility] on Tuesday [facility]. Please return her call.

## 2016-06-27 ENCOUNTER — Encounter (HOSPITAL_COMMUNITY): Payer: Self-pay | Admitting: Emergency Medicine

## 2016-06-27 ENCOUNTER — Emergency Department (HOSPITAL_COMMUNITY)
Admission: EM | Admit: 2016-06-27 | Discharge: 2016-06-27 | Disposition: A | Discharge status: Home / Self Care | Payer: Medicaid Other | Attending: Emergency Medicine | Admitting: Emergency Medicine

## 2016-06-27 DIAGNOSIS — M542 Cervicalgia: Secondary | ICD-10-CM | POA: Diagnosis present

## 2016-06-27 DIAGNOSIS — Y939 Activity, unspecified: Secondary | ICD-10-CM | POA: Insufficient documentation

## 2016-06-27 DIAGNOSIS — Z79899 Other long term (current) drug therapy: Secondary | ICD-10-CM | POA: Diagnosis not present

## 2016-06-27 DIAGNOSIS — Y999 Unspecified external cause status: Secondary | ICD-10-CM | POA: Diagnosis not present

## 2016-06-27 DIAGNOSIS — R51 Headache: Secondary | ICD-10-CM | POA: Diagnosis not present

## 2016-06-27 DIAGNOSIS — Y92410 Unspecified street and highway as the place of occurrence of the external cause: Secondary | ICD-10-CM | POA: Insufficient documentation

## 2016-06-27 DIAGNOSIS — R1084 Generalized abdominal pain: Secondary | ICD-10-CM | POA: Diagnosis not present

## 2016-06-27 DIAGNOSIS — M549 Dorsalgia, unspecified: Secondary | ICD-10-CM | POA: Diagnosis not present

## 2016-06-27 HISTORY — DX: Complete or unspecified spontaneous abortion without complication: O03.9

## 2016-06-27 LAB — I-STAT BETA HCG BLOOD, ED (MC, WL, AP ONLY): HCG, QUANTITATIVE: 21.2 m[IU]/mL — AB (ref <5)

## 2016-06-27 MED ORDER — ACETAMINOPHEN 500 MG PO TABS
1000.0000 mg | ORAL_TABLET | Freq: Once | ORAL | Status: AC
  Administered 2016-06-27: 1000 mg via ORAL
  Filled 2016-06-27: qty 2
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
## 2016-06-27 NOTE — ED Notes (Signed)
PA at bedside.

## 2016-06-27 NOTE — ED Notes (Signed)
Bed: WA05 Expected date:  Expected time:  Means of arrival:  Comments: Triage 7 
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
## 2016-06-27 NOTE — Discharge Instructions (Signed)
Read the information below.  You may return to the Emergency Department at any time for worsening condition or any new symptoms that concern you.  Take tylenol as needed for pain.  If you develop high fevers, worsening abdominal pain, uncontrolled vomiting, or are unable to tolerate fluids by mouth, return to the ER for a recheck.

## 2016-06-27 NOTE — ED Notes (Signed)
Bed: WTR7 Expected date:  Expected time:  Means of arrival:  Comments: 

## 2016-06-28 ENCOUNTER — Encounter: Payer: Self-pay | Admitting: Obstetrics and Gynecology

## 2016-06-28 ENCOUNTER — Ambulatory Visit (INDEPENDENT_AMBULATORY_CARE_PROVIDER_SITE_OTHER): Payer: Medicaid Other | Admitting: Obstetrics and Gynecology

## 2016-06-28 VITALS — BP 122/85 | HR 82 | Wt 135.6 lb

## 2016-06-28 DIAGNOSIS — O039 Complete or unspecified spontaneous abortion without complication: Secondary | ICD-10-CM

## 2016-06-28 NOTE — Patient Instructions (Signed)
Miscarriage  A miscarriage is the sudden loss of an unborn baby (fetus) before the 20th week of pregnancy. Most miscarriages happen in the first 3 months of pregnancy. Sometimes, it happens before a woman even knows she is pregnant. A miscarriage is also called a "spontaneous miscarriage" or "early pregnancy loss." Having a miscarriage can be an emotional experience. Talk with your caregiver about any questions you may have about miscarrying, the grieving process, and your future pregnancy plans.  CAUSES    Problems with the fetal chromosomes that make it impossible for the baby to develop normally. Problems with the baby's genes or chromosomes are most often the result of errors that occur, by chance, as the embryo divides and grows. The problems are not inherited from the parents.   Infection of the cervix or uterus.    Hormone problems.    Problems with the cervix, such as having an incompetent cervix. This is when the tissue in the cervix is not strong enough to hold the pregnancy.    Problems with the uterus, such as an abnormally shaped uterus, uterine fibroids, or congenital abnormalities.    Certain medical conditions.    Smoking, drinking alcohol, or taking illegal drugs.    Trauma.   Often, the cause of a miscarriage is unknown.   SYMPTOMS    Vaginal bleeding or spotting, with or without cramps or pain.   Pain or cramping in the abdomen or lower back.   Passing fluid, tissue, or blood clots from the vagina.  DIAGNOSIS   Your caregiver will perform a physical exam. You may also have an ultrasound to confirm the miscarriage. Blood or urine tests may also be ordered.  TREATMENT    Sometimes, treatment is not necessary if you naturally pass all the fetal tissue that was in the uterus. If some of the fetus or placenta remains in the body (incomplete miscarriage), tissue left behind may become infected and must be removed. Usually, a dilation and curettage (D and C) procedure is performed.  During a D and C procedure, the cervix is widened (dilated) and any remaining fetal or placental tissue is gently removed from the uterus.   Antibiotic medicines are prescribed if there is an infection. Other medicines may be given to reduce the size of the uterus (contract) if there is a lot of bleeding.   If you have Rh negative blood and your baby was Rh positive, you will need a Rh immunoglobulin shot. This shot will protect any future baby from having Rh blood problems in future pregnancies.  HOME CARE INSTRUCTIONS    Your caregiver may order bed rest or may allow you to continue light activity. Resume activity as directed by your caregiver.   Have someone help with home and family responsibilities during this time.    Keep track of the number of sanitary pads you use each day and how soaked (saturated) they are. Write down this information.    Do not use tampons. Do not douche or have sexual intercourse until approved by your caregiver.    Only take over-the-counter or prescription medicines for pain or discomfort as directed by your caregiver.    Do not take aspirin. Aspirin can cause bleeding.    Keep all follow-up appointments with your caregiver.    If you or your partner have problems with grieving, talk to your caregiver or seek counseling to help cope with the pregnancy loss. Allow enough time to grieve before trying to get pregnant again.     SEEK IMMEDIATE MEDICAL CARE IF:    You have severe cramps or pain in your back or abdomen.   You have a fever.   You pass large blood clots (walnut-sized or larger) ortissue from your vagina. Save any tissue for your caregiver to inspect.    Your bleeding increases.    You have a thick, bad-smelling vaginal discharge.   You become lightheaded, weak, or you faint.    You have chills.   MAKE SURE YOU:   Understand these instructions.   Will watch your condition.   Will get help right away if you are not doing well or get worse.     This  information is not intended to replace advice given to you by your health care provider. Make sure you discuss any questions you have with your health care provider.     Document Released: 02/15/2001 Document Revised: 12/17/2012 Document Reviewed: 10/11/2011  Elsevier Interactive Patient Education 2016 [employer]
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
## 2016-07-01 ENCOUNTER — Other Ambulatory Visit: Payer: Medicaid Other

## 2016-07-05 ENCOUNTER — Telehealth: Payer: Self-pay

## 2016-07-05 NOTE — Telephone Encounter (Signed)
I received a [facility] on the T[facility] Adderall for the pt. She says she is the pt's mother. There is no DPR. I tried to call the pt but the VM is full. She is not a patient here at our office.

## 2016-07-10 ENCOUNTER — Emergency Department (HOSPITAL_COMMUNITY): Payer: Medicaid Other

## 2016-07-10 ENCOUNTER — Encounter (HOSPITAL_COMMUNITY): Payer: Self-pay | Admitting: Emergency Medicine

## 2016-07-10 ENCOUNTER — Emergency Department (HOSPITAL_COMMUNITY)
Admission: EM | Admit: 2016-07-10 | Discharge: 2016-07-11 | Disposition: A | Discharge status: Home / Self Care | Payer: Medicaid Other | Attending: Emergency Medicine | Admitting: Emergency Medicine

## 2016-07-10 DIAGNOSIS — Y929 Unspecified place or not applicable: Secondary | ICD-10-CM | POA: Insufficient documentation

## 2016-07-10 DIAGNOSIS — Y999 Unspecified external cause status: Secondary | ICD-10-CM | POA: Diagnosis not present

## 2016-07-10 DIAGNOSIS — S4991XA Unspecified injury of right shoulder and upper arm, initial encounter: Secondary | ICD-10-CM | POA: Insufficient documentation

## 2016-07-10 DIAGNOSIS — M25511 Pain in right shoulder: Secondary | ICD-10-CM

## 2016-07-10 DIAGNOSIS — Y939 Activity, unspecified: Secondary | ICD-10-CM | POA: Diagnosis not present

## 2016-07-10 LAB — I-STAT BETA HCG BLOOD, ED (MC, WL, AP ONLY): I-stat hCG, quantitative: <5 m[IU]/mL (ref <5)
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
## 2016-07-11 MED ORDER — NAPROXEN 500 MG PO TABS: 500.0000 mg | ORAL_TABLET | Freq: Two times a day (BID) | ORAL | 0 refills | Status: DC

## 2016-07-11 MED ORDER — CYCLOBENZAPRINE HCL 5 MG PO TABS: 5.0000 mg | ORAL_TABLET | Freq: Three times a day (TID) | ORAL | 0 refills | Status: DC | PRN

## 2016-07-11 MED ORDER — CYCLOBENZAPRINE HCL 10 MG PO TABS
5.0000 mg | ORAL_TABLET | Freq: Once | ORAL | Status: AC
  Administered 2016-07-11: 5 mg via ORAL
  Filled 2016-07-11: qty 1

## 2016-07-11 MED ORDER — NAPROXEN 500 MG PO TABS
500.0000 mg | ORAL_TABLET | Freq: Once | ORAL | Status: AC
  Administered 2016-07-11: 500 mg via ORAL
  Filled 2016-07-11: qty 1

## 2016-07-11 NOTE — Discharge Instructions (Signed)
Read the information below.  Your x-rays did not show any obvious fracture or dislocation. You may have a shoulder sprain or strain. You are being placed in a sling. Please wear for the next 2-3 days. Please ice for 20 minute increments for the next 2-3 days. I have prescribed naprosyn and flexeril for pain relief. While taking naprosyn do not take other NSAIDs (motrin, ibuprofen, or aleve). Flexeril can make you drowsy, do not drive after taking. Perform gentle range of motion exercises as tolerated. Please follow up with orthopedics if symptoms persist, I have provided the contact information. You can also follow up with your primary doctor instead if you prefer.   You may return to the Emergency Department at any time for worsening condition or any new symptoms that concern you. Return if you develop fever, loss of sensation, weakness, or any new/concerning symptoms.

## 2016-07-14 ENCOUNTER — Encounter: Payer: Medicaid Other | Admitting: Obstetrics and Gynecology

## 2017-12-04 ENCOUNTER — Other Ambulatory Visit: Payer: Self-pay

## 2017-12-04 ENCOUNTER — Emergency Department (HOSPITAL_COMMUNITY)
Admission: EM | Admit: 2017-12-04 | Discharge: 2017-12-04 | Disposition: A | Discharge status: Home / Self Care | Payer: Medicaid Other | Attending: Emergency Medicine | Admitting: Emergency Medicine

## 2017-12-04 ENCOUNTER — Emergency Department (HOSPITAL_COMMUNITY): Payer: Medicaid Other

## 2017-12-04 ENCOUNTER — Encounter (HOSPITAL_COMMUNITY): Payer: Self-pay

## 2017-12-04 DIAGNOSIS — R55 Syncope and collapse: Secondary | ICD-10-CM

## 2017-12-04 DIAGNOSIS — E876 Hypokalemia: Secondary | ICD-10-CM

## 2017-12-04 DIAGNOSIS — Z79899 Other long term (current) drug therapy: Secondary | ICD-10-CM | POA: Insufficient documentation

## 2017-12-04 DIAGNOSIS — M25511 Pain in right shoulder: Secondary | ICD-10-CM

## 2017-12-04 LAB — I-STAT CHEM 8, ED
BUN: 9 mg/dL (ref 6–20)
CALCIUM ION: 1.12 mmol/L — AB (ref 1.15–1.40)
CHLORIDE: 108 mmol/L (ref 101–111)
Creatinine, Ser: 0.6 mg/dL (ref 0.44–1.00)
Glucose, Bld: 94 mg/dL (ref 65–99)
HEMATOCRIT: 38 % (ref 36.0–46.0)
Hemoglobin: 12.9 g/dL (ref 12.0–15.0)
Potassium: 3.1 mmol/L — ABNORMAL LOW (ref 3.5–5.1)
SODIUM: 143 mmol/L (ref 135–145)
TCO2: 19 mmol/L — AB (ref 22–32)

## 2017-12-04 LAB — I-STAT BETA HCG BLOOD, ED (MC, WL, AP ONLY)

## 2017-12-04 MED ORDER — POTASSIUM CHLORIDE CRYS ER 20 MEQ PO TBCR
40.0000 meq | EXTENDED_RELEASE_TABLET | Freq: Once | ORAL | Status: AC
  Administered 2017-12-04: 40 meq via ORAL
  Filled 2017-12-04: qty 2

## 2017-12-04 NOTE — ED Provider Notes (Signed)
[facility]-EMERGENCY DEPT Provider Note   CSN: [id number] Arrival date & time: 12/04/17  1435     History   Chief Complaint Chief Complaint  Patient presents with  . Shoulder Injury    HPI Erin Ponce is a 22 y.o. right-handed female with a past medical history of anxiety who presents emergency department today for possible shoulder dislocation.  Patient states that since early March when she had a injury to the right shoulder she has been having daily episodes of dislocations.  She states this usually happens with pulling motions and he is able to relax she is able to pop the joint back in within a few minutes.  She states that today she was opening a door when the right shoulder popped out of place.  She became very anxious when he did not pop back and as it usually does.  She was transferred by EMS to the emergency department where she was reported to have a "anxiety attack" per the patient.  EMS reports that she became very anxious and had episode of hyperventilation with brief episodes of syncope x3 that lasted less than 1 minute.  After the patient was given 250 mcg of intranasal fentanyl she states that her pain improved and her anxiety lessened.  No syncope since the event.  Her pain is currently controlled.  She has never seen an orthopedist or doctor for her shoulder.  The patient denies any numbness/tingling distal to the extremity.  The patient denies any associated chest pain, palpitations, shortness breath, abdominal pain, headache, focal neurologic deficits, visual changes, facial droop, vertigo associated with syncope.  Denies fever or URI symptoms.  Denies risk factors for DVT/PE including exogenous estrogen use, recent surgery or travel, trauma, immobilization, smoking, previous blood clot, cough, hemoptysis, cancer, lower extremity pain or swelling, or family history of bleeding/clotting disorder.   HPI  Past Medical History:  Diagnosis Date  .  Anxiety   . Dislocated knee 2008   R knee  . Miscarriage     There are no active problems to display for this patient.   Past Surgical History:  Procedure Laterality Date  . NO PAST SURGERIES       OB History    Gravida  1   Para      Term      Preterm      AB      Living        SAB      TAB      Ectopic      Multiple      Live Births               Home Medications    Prior to Admission medications   Medication Sig Start Date End Date Taking? Authorizing Provider  acetaminophen (TYLENOL) 500 MG tablet Take 500 mg by mouth every 6 (six) hours as needed for mild pain.    [provider]  cyclobenzaprine (FLEXERIL) 5 MG tablet Take 1 tablet (5 mg total) by mouth 3 (three) times daily as needed for muscle spasms. 07/11/16   Deborha Payment, PA-C  naproxen (NAPROSYN) 500 MG tablet Take 1 tablet (500 mg total) by mouth 2 (two) times daily. 07/11/16   Deborha Payment, PA-C  Prenatal Vit-Fe Fumarate-FA (PRENATAL MULTIVITAMIN) TABS tablet Take 1 tablet by mouth daily at 12 noon.     [provider]    Family History Family History  Problem Relation Age of Onset  .  Diabetes Mother   . Thyroid disease Mother     Social History Social History   Tobacco Use  . Smoking status: Never Smoker  . Smokeless tobacco: Never Used  Substance Use Topics  . Alcohol use: No  . Drug use: No     Allergies   Patient has no known allergies.   Review of Systems Review of Systems  All other systems reviewed and are negative.    Physical Exam Updated Vital Signs BP 125/86 (BP Location: Right Arm)   Pulse 98   Temp 98.9 F (37.2 C) (Oral)   Resp 18   Wt 60.3 kg (133 lb)   LMP 09/11/2017   SpO2 99%   Breastfeeding? Yes   BMI 24.33 kg/m   Physical Exam  Constitutional: She appears well-developed and well-nourished.  HENT:  Head: Normocephalic and atraumatic.  Right Ear: External ear normal.  Left Ear: External ear normal.  Nose: Nose  normal.  Mouth/Throat: Uvula is midline, oropharynx is clear and moist and mucous membranes are normal. No tonsillar exudate.  Eyes: Pupils are equal, round, and reactive to light. Right eye exhibits no discharge. Left eye exhibits no discharge. No scleral icterus.  Neck: Trachea normal. Neck supple. No spinous process tenderness present. No neck rigidity. Normal range of motion present.  Cardiovascular: Normal rate, regular rhythm and intact distal pulses.  No murmur heard. Pulses:      Radial pulses are 2+ on the right side, and 2+ on the left side.       Dorsalis pedis pulses are 2+ on the right side, and 2+ on the left side.       Posterior tibial pulses are 2+ on the right side, and 2+ on the left side.  No lower extremity swelling or edema. Calves symmetric in size bilaterally.  Pulmonary/Chest: Effort normal and breath sounds normal. She exhibits no tenderness.  Abdominal: Soft. Bowel sounds are normal. There is no tenderness. There is no rigidity, no rebound, no guarding and no CVA tenderness.  Musculoskeletal: She exhibits no edema.  No C-spine tenderness palpation or step-offs.  Normal range of motion of the cervical spine. Diffuse tenderness over the right shoulder.  No point tenderness.  [name] able to touch the left shoulder with right hand.  Normal strength with abduction.  Limited range of motion secondary to pain.  No overlying erythema, heat or joint swelling Compartments are soft of the right arm. Radial pulse 2+.  Cap refill less than 2 seconds. Intact sensation to light touch of the axillary, medial, ulnar and radial nerves.  Lymphadenopathy:    She has no cervical adenopathy.  Neurological: She is alert.  Skin: Skin is warm and dry. No rash noted. She is not diaphoretic.  Psychiatric: She has a normal mood and affect.  Nursing note and vitals reviewed.    ED Treatments / Results  Labs (all labs ordered are listed, but only abnormal results are displayed) Labs  Reviewed  I-STAT CHEM 8, ED - Abnormal; Notable for the following components:      Result Value   Potassium 3.1 (*)    Calcium, Ion 1.12 (*)    TCO2 19 (*)    All other components within normal limits  I-STAT BETA HCG BLOOD, ED ([facility], [facility], [facility] ONLY)    EKG EKG Interpretation  Date/Time:  Monday December 04 2017 15:36:27 EDT Ventricular Rate:  86 PR Interval:    QRS Duration: 78 QT Interval:  348 QTC Calculation: 417 R Axis:  75 Text Interpretation:  Sinus rhythm Confirmed by Tilden Fossaees, Elizabeth [phone]([id number]) on 12/04/2017 5:42:09 PM   Radiology Dg Shoulder Right  Result Date: 12/04/2017 CLINICAL DATA:  Possible dislocation while reaching for door handle, initial encounter EXAM: RIGHT SHOULDER - 2+ VIEW COMPARISON:  01/09/2010 FINDINGS: There is no evidence of fracture or dislocation. There is no evidence of arthropathy or other focal bone abnormality. Soft tissues are unremarkable. IMPRESSION: No acute abnormality noted. Electronically Signed   By: Alcide CleverMark  Lukens M.D.   On: 12/04/2017 16:40    Procedures Procedures (including critical care time) SPLINT APPLICATION Date/Time: 6:02 PM Authorized by: Jacinto HalimMichael M Kielee Care Consent: Verbal consent obtained. Risks and benefits: risks, benefits and alternatives were discussed Consent given by: patient Splint applied by: orthopedic technician Location details: right shoulder Splint type: right shoulder sling Supplies used: right shoulder sling Post-procedure: The splinted body part was neurovascularly unchanged following the procedure. Patient tolerance: Patient tolerated the procedure well with no immediate complications.   Medications Ordered in ED Medications  potassium chloride SA (K-DUR,KLOR-CON) CR tablet 40 mEq (40 mEq Oral Given 12/04/17 1716)     Initial Impression / Assessment and Plan / ED Course  I have reviewed the triage vital signs and the nursing notes.  Pertinent labs & imaging results that were available during my care of the  patient were reviewed by me and considered in my medical decision making (see chart for details).     22 y.o. female who presents the emergency department today for possible right shoulder dislocation.  She reports that since March injury she has been having daily dislocations but has been able to self reduce these.  She states that she became very anxious after not being able to reduce this today.  On presentation the patient is neurovascularly intact.  There is no obvious deformity and patient is able to touch the right hand to the left shoulder.  No history of trauma.  X-ray negative for dislocation or fracture.  Suspect this was reduced prior to my evaluation.  Do not suspect septic joint.  No further management required at this time.  Placed in a shoulder sling and will be given referral to orthopedics. Specific return precautions discussed. Time was given for all questions to be answered. The patient verbalized understanding and agreement with plan. The patient appears safe for discharge home.  Patient also with 3 <60 second episodes of syncope per EMS which she says is secondary to an anxiety attack.   The patient denies any associated chest pain, palpitations, shortness breath, abdominal pain, headache, focal neurologic deficits, visual changes, facial droop, vertigo associated with syncope.  Denies fever or URI symptoms.  No PE risk factors.  EKG sinus rhythm.  No prolonged QT.  No delta wave.  No ST changes. Patient without arrhythmia or tachycardia while here in the department.  Patient without history of congestive heart failure, normal hematocrit, normal ECG, no shortness of breath and systolic blood pressure greater than 90; patient is low risk. Will plan for discharge home with close PCP follow-up.  Patient does have mildly low potassium.  Replaced orally. Possibility of recurrent syncope has been discussed. I discussed reasons to avoid driving until cardiology/PCP followup and other safety  prevention including use of ladders and working at heights.   Pt has remained hemodynamically stable throughout their time in the ED  BP 131/84 (BP Location: Left Arm)   Pulse 91   Temp 98.1 F (36.7 C) (Oral)   Resp 14   Wt  60.3 kg (133 lb)   LMP 09/11/2017   SpO2 99%   Breastfeeding? Yes   BMI 24.33 kg/m   The patient was discussed with and seen by Dr. Madilyn Hook who agrees with the treatment plan.  Final Clinical Impressions(s) / ED Diagnoses   Final diagnoses:  Acute pain of right shoulder  Syncope, unspecified syncope type  Hypokalemia    ED Discharge Orders    None       Princella Pellegrini 12/04/17 1857    Tilden Fossa, MD 12/05/17 1450

## 2017-12-04 NOTE — ED Notes (Signed)
Bed: [facility]04 Expected date:  Expected time:  Means of arrival:  Comments: EMS dislocation

## 2017-12-04 NOTE — ED Triage Notes (Signed)
Patient BIB EMS from POV- patient was on her way to hospital, but called EMS due to pain. Patient reports she has history of spontaneous R shoulder popping out of place. Patient reports today she was opening a door and her R shoulder popped out of place, but didn't pop itself back in place as it usually does.  250mcg IN Fentanyl administered by EMS. Patient lost consciousness for EMS 3x due to hyperventilation. Patient reports pain 7/10.

## 2017-12-04 NOTE — Discharge Instructions (Addendum)
Your workup was reassuring. There was no evidence of dislocation or fracture. Please follow up with ortho. See attached handout. Please take shoulder out of sling and perform shoulder range of motion exercises. You do not need to wear the sling daily.  You had a syncopal episode. Your workup was reassuring. See attached handout. Follow up with your primary care doctor.  If you develop worsening or new concerning symptoms you can return to the emergency department for re-evaluation.

## 2018-01-07 ENCOUNTER — Emergency Department (HOSPITAL_COMMUNITY)
Admission: EM | Admit: 2018-01-07 | Discharge: 2018-01-07 | Disposition: A | Discharge status: Home / Self Care | Payer: Medicaid Other | Attending: Emergency Medicine | Admitting: Emergency Medicine

## 2018-01-07 ENCOUNTER — Emergency Department (HOSPITAL_COMMUNITY): Payer: Medicaid Other

## 2018-01-07 DIAGNOSIS — Z79899 Other long term (current) drug therapy: Secondary | ICD-10-CM | POA: Insufficient documentation

## 2018-01-07 DIAGNOSIS — M25511 Pain in right shoulder: Secondary | ICD-10-CM | POA: Insufficient documentation

## 2018-01-07 MED ORDER — TRAMADOL HCL 50 MG PO TABS: 50.0000 mg | ORAL_TABLET | Freq: Four times a day (QID) | ORAL | 0 refills | Status: DC | PRN

## 2018-01-07 NOTE — ED Provider Notes (Signed)
[facility] EMERGENCY DEPARTMENT Provider Note   CSN: [id number] Arrival date & time: 01/07/18  0159     History   Chief Complaint Chief Complaint  Patient presents with  . Shoulder Pain    HPI Erin Ponce is a 22 y.o. female.  Plan shoulder injury.  HPI: She has history of previous right shoulder dislocation.  She was doing simple activities at home and felt a pop and pain in her shoulder.  Was unable to move it it was severely painful.  Seen and evaluated by paramedics.  Given intranasal fentanyl.  While being transferred from one stretcher to the other she felt like it relocated.  Her pain is markedly improved.  Past Medical History:  Diagnosis Date  . Anxiety   . Dislocated knee 2008   R knee  . Miscarriage     There are no active problems to display for this patient.   Past Surgical History:  Procedure Laterality Date  . NO PAST SURGERIES       OB History    Gravida  1   Para      Term      Preterm      AB      Living        SAB      TAB      Ectopic      Multiple      Live Births               Home Medications    Prior to Admission medications   Medication Sig Start Date End Date Taking? Authorizing Provider  Prenatal Vit-Fe Fumarate-FA (PRENATAL MULTIVITAMIN) TABS tablet Take 1 tablet by mouth daily at 12 noon.    Yes [provider]  cyclobenzaprine (FLEXERIL) 5 MG tablet Take 1 tablet (5 mg total) by mouth 3 (three) times daily as needed for muscle spasms. Patient not taking: Reported on 12/04/2017 07/11/16   Deborha Payment, PA-C  naproxen (NAPROSYN) 500 MG tablet Take 1 tablet (500 mg total) by mouth 2 (two) times daily. Patient not taking: Reported on 12/04/2017 07/11/16   Deborha Payment, PA-C  traMADol (ULTRAM) 50 MG tablet Take 1 tablet (50 mg total) by mouth every 6 (six) hours as needed. 01/07/18   Rolland Porter, MD    Family History Family History  Problem Relation Age of Onset  . Diabetes Mother     . Thyroid disease Mother     Social History Social History   Tobacco Use  . Smoking status: Never Smoker  . Smokeless tobacco: Never Used  Substance Use Topics  . Alcohol use: No  . Drug use: No     Allergies   Patient has no known allergies.   Review of Systems Review of Systems  Constitutional: Negative for appetite change, chills, diaphoresis, fatigue and fever.  HENT: Negative for mouth sores, sore throat and trouble swallowing.   Eyes: Negative for visual disturbance.  Respiratory: Negative for cough, chest tightness, shortness of breath and wheezing.   Cardiovascular: Negative for chest pain.  Gastrointestinal: Negative for abdominal distention, abdominal pain, diarrhea, nausea and vomiting.  Endocrine: Negative for polydipsia, polyphagia and polyuria.  Genitourinary: Negative for dysuria, frequency and hematuria.  Musculoskeletal: Negative for gait problem.       Right shoulder pain.  Skin: Negative for color change, pallor and rash.  Neurological: Negative for dizziness, syncope, light-headedness and headaches.  Hematological: Does not bruise/bleed easily.  Psychiatric/Behavioral: Negative for behavioral  problems and confusion.     Physical Exam Updated Vital Signs BP 115/82   Pulse 90   Temp 98.4 F (36.9 C) (Oral)   LMP 09/05/2017   SpO2 100%   Physical Exam  Constitutional: She is oriented to person, place, and time. She appears well-developed and well-nourished. No distress.  HENT:  Head: Normocephalic.  Eyes: Pupils are equal, round, and reactive to light. Conjunctivae are normal. No scleral icterus.  Neck: Normal range of motion. Neck supple. No thyromegaly present.  Cardiovascular: Normal rate and regular rhythm. Exam reveals no gallop and no friction rub.  No murmur heard. Pulmonary/Chest: Effort normal and breath sounds normal. No respiratory distress. She has no wheezes. She has no rales.  Abdominal: Soft. Bowel sounds are normal. She  exhibits no distension. There is no tenderness. There is no rebound.  Musculoskeletal: Normal range of motion.  Shoulder palpates to be relocated well within the glenohumeral joint.  Normal axillary nerve sensation.  Neurological: She is alert and oriented to person, place, and time.  Skin: Skin is warm and dry. No rash noted.  Psychiatric: She has a normal mood and affect. Her behavior is normal.     ED Treatments / Results  Labs (all labs ordered are listed, but only abnormal results are displayed) Labs Reviewed - No data to display  EKG None  Radiology Dg Shoulder Right  Result Date: 01/07/2018 CLINICAL DATA:  22 year old female with possible right shoulder dislocation. EXAM: RIGHT SHOULDER - 2+ VIEW COMPARISON:  Right shoulder radiograph dated 12/04/2017 FINDINGS: There is no evidence of fracture or dislocation. There is no evidence of arthropathy or other focal bone abnormality. Soft tissues are unremarkable. IMPRESSION: Negative. Electronically Signed   By: Elgie Collard M.D.   On: 01/07/2018 02:41    Procedures Procedures (including critical care time)  Medications Ordered in ED Medications - No data to display   Initial Impression / Assessment and Plan / ED Course  I have reviewed the triage vital signs and the nursing notes.  Pertinent labs & imaging results that were available during my care of the patient were reviewed by me and considered in my medical decision making (see chart for details).    They show no fracture or other acute abnormality.  Plan will be discharged home.  Limited by pain medication.  Splint.  Increased range of motion.  Final Clinical Impressions(s) / ED Diagnoses   Final diagnoses:  Acute pain of right shoulder    ED Discharge Orders        Ordered    traMADol (ULTRAM) 50 MG tablet  Every 6 hours PRN     01/07/18 0256       Rolland Porter, MD 01/07/18 [id number]

## 2018-01-07 NOTE — Discharge Instructions (Addendum)
Remove sling daily and gently move shoulder through range of motion. Call Dr. Liliane Channel if pain not improving within 7 days.
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
## 2018-01-07 NOTE — ED Notes (Signed)
Patient transported to X-ray 

## 2018-01-12 ENCOUNTER — Encounter (INDEPENDENT_AMBULATORY_CARE_PROVIDER_SITE_OTHER): Payer: Self-pay | Admitting: Orthopaedic Surgery

## 2018-01-12 ENCOUNTER — Ambulatory Visit (INDEPENDENT_AMBULATORY_CARE_PROVIDER_SITE_OTHER): Payer: Medicaid Other | Admitting: Orthopaedic Surgery

## 2018-01-12 ENCOUNTER — Telehealth (INDEPENDENT_AMBULATORY_CARE_PROVIDER_SITE_OTHER): Payer: Self-pay | Admitting: Orthopaedic Surgery

## 2018-01-12 DIAGNOSIS — M25511 Pain in right shoulder: Secondary | ICD-10-CM | POA: Diagnosis not present

## 2018-01-12 DIAGNOSIS — G8929 Other chronic pain: Secondary | ICD-10-CM | POA: Diagnosis not present
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
## 2018-02-02 ENCOUNTER — Other Ambulatory Visit: Payer: Medicaid Other

## 2018-02-02 ENCOUNTER — Inpatient Hospital Stay
Admission: RE | Admit: 2018-02-02 | Discharge: 2018-02-02 | Disposition: A | Discharge status: Home / Self Care | Payer: Medicaid Other | Source: Ambulatory Visit | Attending: Orthopaedic Surgery | Admitting: Orthopaedic Surgery

## 2018-03-26 ENCOUNTER — Emergency Department (HOSPITAL_COMMUNITY)
Admission: EM | Admit: 2018-03-26 | Discharge: 2018-03-26 | Discharge status: Against Medical Advice | Payer: Medicaid Other | Attending: Emergency Medicine | Admitting: Emergency Medicine

## 2018-03-26 DIAGNOSIS — Z1339 Encounter for screening examination for other mental health and behavioral disorders: Secondary | ICD-10-CM | POA: Diagnosis present

## 2018-03-26 DIAGNOSIS — Z5321 Procedure and treatment not carried out due to patient leaving prior to being seen by health care provider: Secondary | ICD-10-CM | POA: Insufficient documentation

## 2018-03-26 NOTE — ED Notes (Signed)
Bed: WA29 Expected date:  Expected time:  Means of arrival:  Comments: EMS-depression

## 2018-03-26 NOTE — ED Notes (Signed)
Helmut MusterAlicia, NT asked patient if she could change into paper scrubs and patient refused. Patient stated that she was just hungry, wanted a sandwich and no longer needed to be here. Charge nurse aware. Patient left prior to signing AMA paperwork. Patient discharged with self, and displayed no signs of instability walking out of emergency department.

## 2018-03-26 NOTE — ED Triage Notes (Signed)
Transported by [facility] from home--mother called EMS and stated that patient has presented "differently" for the last 2 months. 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 Recently moved back in with mom, and told EMS that she feels overwhelmed. Denies SI/HI. Patient also experienced a panic attack PTA with EMS.

## 2018-03-29 ENCOUNTER — Other Ambulatory Visit: Payer: Self-pay

## 2018-03-29 ENCOUNTER — Encounter (HOSPITAL_COMMUNITY): Payer: Self-pay | Admitting: Emergency Medicine

## 2018-03-29 ENCOUNTER — Emergency Department (HOSPITAL_COMMUNITY)
Admission: EM | Admit: 2018-03-29 | Discharge: 2018-03-30 | Disposition: A | Discharge status: Other Acute Inpatient Hospital | Payer: Medicaid Other | Attending: Emergency Medicine | Admitting: Emergency Medicine

## 2018-03-29 DIAGNOSIS — F329 Major depressive disorder, single episode, unspecified: Secondary | ICD-10-CM | POA: Insufficient documentation

## 2018-03-29 DIAGNOSIS — Z79899 Other long term (current) drug therapy: Secondary | ICD-10-CM | POA: Insufficient documentation

## 2018-03-29 DIAGNOSIS — F22 Delusional disorders: Secondary | ICD-10-CM

## 2018-03-29 DIAGNOSIS — F29 Unspecified psychosis not due to a substance or known physiological condition: Secondary | ICD-10-CM | POA: Diagnosis present

## 2018-03-29 LAB — COMPREHENSIVE METABOLIC PANEL
ALBUMIN: 4.2 g/dL (ref 3.5–5.0)
ALK PHOS: 65 U/L (ref 38–126)
ALT: 17 U/L (ref 0–44)
AST: 27 U/L (ref 15–41)
Anion gap: 9 (ref 5–15)
BUN: 9 mg/dL (ref 6–20)
CALCIUM: 9.4 mg/dL (ref 8.9–10.3)
CO2: 27 mmol/L (ref 22–32)
CREATININE: 0.78 mg/dL (ref 0.44–1.00)
Chloride: 103 mmol/L (ref 98–111)
GFR calc Af Amer: >60 mL/min (ref >60)
GFR calc non Af Amer: >60 mL/min (ref >60)
GLUCOSE: 99 mg/dL (ref 70–99)
Potassium: 4.1 mmol/L (ref 3.5–5.1)
SODIUM: 139 mmol/L (ref 135–145)
Total Bilirubin: 0.5 mg/dL (ref 0.3–1.2)
Total Protein: 7 g/dL (ref 6.5–8.1)

## 2018-03-29 LAB — CBC
HCT: 39.5 % (ref 36.0–46.0)
Hemoglobin: 12.5 g/dL (ref 12.0–15.0)
MCH: 27.6 pg (ref 26.0–34.0)
MCHC: 31.6 g/dL (ref 30.0–36.0)
MCV: 87.2 fL (ref 78.0–100.0)
PLATELETS: 315 10*3/uL (ref 150–400)
RBC: 4.53 MIL/uL (ref 3.87–5.11)
RDW: 14.5 % (ref 11.5–15.5)
WBC: 6.6 10*3/uL (ref 4.0–10.5)

## 2018-03-29 LAB — RAPID URINE DRUG SCREEN, HOSP PERFORMED
Amphetamines: NOT DETECTED
BENZODIAZEPINES: NOT DETECTED
Barbiturates: NOT DETECTED
Cocaine: NOT DETECTED
Opiates: NOT DETECTED
Tetrahydrocannabinol: POSITIVE — AB

## 2018-03-29 LAB — ACETAMINOPHEN LEVEL: Acetaminophen (Tylenol), Serum: <10 ug/mL — ABNORMAL LOW (ref 10–30)

## 2018-03-29 LAB — SALICYLATE LEVEL: Salicylate Lvl: <7 mg/dL (ref 2.8–30.0)

## 2018-03-29 LAB — HCG, QUANTITATIVE, PREGNANCY

## 2018-03-29 LAB — ETHANOL: Alcohol, Ethyl (B): <10 mg/dL (ref <10)

## 2018-03-29 MED ORDER — LORAZEPAM 2 MG/ML IJ SOLN
1.0000 mg | Freq: Once | INTRAMUSCULAR | Status: AC | PRN
  Administered 2018-03-29: 1 mg via INTRAMUSCULAR
  Filled 2018-03-29: qty 1

## 2018-03-29 MED ORDER — TRAZODONE HCL 100 MG PO TABS
100.0000 mg | ORAL_TABLET | Freq: Every day | ORAL | Status: DC
  Administered 2018-03-29: 100 mg via ORAL
  Filled 2018-03-29: qty 1

## 2018-03-29 MED ORDER — HYDROXYZINE HCL 25 MG PO TABS
25.0000 mg | ORAL_TABLET | Freq: Three times a day (TID) | ORAL | Status: DC
  Administered 2018-03-29 – 2018-03-30 (×2): 25 mg via ORAL
  Filled 2018-03-29 (×4): qty 1

## 2018-03-29 NOTE — ED Notes (Signed)
Pt oriented to room and unit.  Pt is very bizarre and paranoid.  She believes she is here due to "forgetful episodes"  She is very withdrawn and tearful at times.  15 minute checks and video monitoring in place.

## 2018-03-29 NOTE — ED Notes (Signed)
SBAR Report received from previous nurse. Pt received calm and visible on unit. Pt denies current SI/ HI, A/V H, depression, anxiety, or pain at this time, and appears otherwise stable and free of distress. Pt reminded of camera surveillance, q 15 min rounds, and rules of the milieu. Will continue to assess. 

## 2018-03-29 NOTE — ED Notes (Signed)
Bed: WA31 Expected date:  Expected time:  Means of arrival:  Comments: 

## 2018-03-29 NOTE — ED Provider Notes (Signed)
4:55 PM-I was asked by the psychiatric nurse practitioner to see this patient to initiate involuntary commitment because she is "psychotic and wants to leave."  Approach the patient and she was talking on the phone to someone.  I listened to her talk for about 5 minutes, and she was discussing her concerns about living with her mother and not getting along with her mother.  She was talking about not being able to be a good parent to her infant child, and not knowing what to do with "Marcello Mooressaac."  Patient was having a good conversation with the person on the phone and seemed goal oriented.  One point she talked about not being able to communicate on her mobile phone because it was "hacked."  Patient does not appear to meet criteria for involuntary commitment.  10:30 PM-nursing staff states that the patient has taking her nighttime sedating medications and is currently sleeping.  She has been cooperative on the unit and has no further complaints.  We will continue evaluation and treatment by TTS.   Mancel BaleWentz, Arian Murley, MD 03/30/18 [phone]

## 2018-03-29 NOTE — ED Provider Notes (Signed)
[facility]-EMERGENCY DEPT Provider Note   CSN: [id number] Arrival date & time: 03/29/18  1048     History   Chief Complaint Chief Complaint  Patient presents with  . Depression  . Paranoid    HPI Erin Ponce is a 22 y.o. female.  22 year old female with a past medical history of anxiety presents to the ED today with a complaint of depression, and paranoia.  States she has been feeling like she is not around the same timeline as she was before.  Patient reports she feels this way because she has not seen her daughter and her daughter's father in the same room.  Patient states this started on Saturday after she had gone to the club, she denies drinking but states she feels like she needs a break from her daughter.  Patient states "I love my daughter very much, but I feel like he needed a break sometimes" ,patient states she feels like she should not be talking to people.  Patient denies any suicidal ideations, homicidal ideations, and denies having a plan or a past history of.      Past Medical History:  Diagnosis Date  . Anxiety   . Dislocated knee 2008   R knee  . Miscarriage     There are no active problems to display for this patient.   Past Surgical History:  Procedure Laterality Date  . NO PAST SURGERIES       OB History    Gravida  1   Para      Term      Preterm      AB      Living        SAB      TAB      Ectopic      Multiple      Live Births               Home Medications    Prior to Admission medications   Medication Sig Start Date End Date Taking? Authorizing Provider  IRON, FERROUS SULFATE, PO Take 1 tablet by mouth daily.   Yes [provider]  Omega-3 Fatty Acids (OMEGA-3 FISH OIL PO) Take 1 capsule by mouth daily.   Yes [provider]  Prenatal Vit-Fe Fumarate-FA (PRENATAL MULTIVITAMIN) TABS tablet Take 1 tablet by mouth daily at 12 noon.    Yes [provider]    cyclobenzaprine (FLEXERIL) 5 MG tablet Take 1 tablet (5 mg total) by mouth 3 (three) times daily as needed for muscle spasms. Patient not taking: Reported on 12/04/2017 07/11/16   Deborha Payment, PA-C  naproxen (NAPROSYN) 500 MG tablet Take 1 tablet (500 mg total) by mouth 2 (two) times daily. Patient not taking: Reported on 12/04/2017 07/11/16   Deborha Payment, PA-C  traMADol (ULTRAM) 50 MG tablet Take 1 tablet (50 mg total) by mouth every 6 (six) hours as needed. Patient not taking: Reported on 03/26/2018 01/07/18   Rolland Porter, MD    Family History Family History  Problem Relation Age of Onset  . Diabetes Mother   . Thyroid disease Mother     Social History Social History   Tobacco Use  . Smoking status: Never Smoker  . Smokeless tobacco: Never Used  Substance Use Topics  . Alcohol use: No  . Drug use: Yes    Types: Marijuana    Comment: recently stopped     Allergies   Latex   Review of Systems Review  of Systems  Constitutional: Negative for chills and fever.  HENT: Negative for ear pain and sore throat.   Eyes: Negative for pain and visual disturbance.  Respiratory: Negative for cough and shortness of breath.   Cardiovascular: Negative for chest pain and palpitations.  Gastrointestinal: Negative for abdominal pain and vomiting.  Genitourinary: Negative for dysuria and hematuria.  Musculoskeletal: Negative for arthralgias and back pain.  Skin: Negative for color change and rash.  Neurological: Negative for seizures and syncope.  Psychiatric/Behavioral: Positive for sleep disturbance. Negative for confusion, decreased concentration, hallucinations, self-injury and suicidal ideas. The patient is nervous/anxious.   All other systems reviewed and are negative.    Physical Exam Updated Vital Signs BP (!) 150/98 (BP Location: Left Arm)   Pulse (!) 104   Temp 98.2 F (36.8 C) (Oral)   Resp 19   Ht 5\' 2"  (1.575 m)   Wt 54.4 kg (120 lb)   SpO2 100%   BMI 21.95 kg/m    Physical Exam  Constitutional: She is oriented to person, place, and time. She appears well-developed and well-nourished. No distress.  HENT:  Head: Normocephalic and atraumatic.  Mouth/Throat: Oropharynx is clear and moist. No oropharyngeal exudate.  Eyes: Pupils are equal, round, and reactive to light.  Neck: Normal range of motion.  Cardiovascular: Regular rhythm and normal heart sounds.  Pulmonary/Chest: Effort normal and breath sounds normal. No respiratory distress.  Abdominal: Soft. Bowel sounds are normal. She exhibits no distension. There is no tenderness.  Musculoskeletal: She exhibits no tenderness or deformity.       Right lower leg: She exhibits no edema.       Left lower leg: She exhibits no edema.  Neurological: She is alert and oriented to person, place, and time.  Skin: Skin is warm and dry.  Psychiatric: Judgment normal. Her speech is delayed. She is slowed. She is not actively hallucinating. Thought content is paranoid. Cognition and memory are normal. She does not express impulsivity or inappropriate judgment. She exhibits a depressed mood.  Nursing note and vitals reviewed.    ED Treatments / Results  Labs (all labs ordered are listed, but only abnormal results are displayed) Labs Reviewed  ACETAMINOPHEN LEVEL - Abnormal; Notable for the following components:      Result Value   Acetaminophen (Tylenol), Serum <10 (*)    All other components within normal limits  RAPID URINE DRUG SCREEN, HOSP PERFORMED - Abnormal; Notable for the following components:   Tetrahydrocannabinol POSITIVE (*)    All other components within normal limits  COMPREHENSIVE METABOLIC PANEL  ETHANOL  SALICYLATE LEVEL  CBC  I-STAT BETA HCG BLOOD, ED ([facility], [facility], [facility] ONLY)    EKG None  Radiology No results found.  Procedures Procedures (including critical care time)  Medications Ordered in ED Medications  hydrOXYzine (ATARAX/VISTARIL) tablet 25 mg (25 mg Oral Refused 03/29/18 1225)   traZODone (DESYREL) tablet 100 mg (has no administration in time range)  LORazepam (ATIVAN) injection 1 mg (1 mg Intramuscular Given 03/29/18 1228)     Initial Impression / Assessment and Plan / ED Course  I have reviewed the triage vital signs and the nursing notes.  Pertinent labs & imaging results that were available during my care of the patient were reviewed by me and considered in my medical decision making (see chart for details).     Patient states she feels like she is in another timeline, the only way for her to feel like the time is current is to  see her boyfriend and daughter in a room.Patient states she loves her daughter but also needs a break.Patient denies any SI, or HI at this time. She is having no current medical complaints. I believe it sounds like patient feels stressed, tired and sleep deprived. [name] medically cleared for psychiatry evaluation.   Patient was going to be evaluated by the Psychiatric team but she was given ativan prior to the interview. They will try her at a later time.Pending TTS consult.   Final Clinical Impressions(s) / ED Diagnoses   Final diagnoses:  Paranoia [facility](HCC)    ED Discharge Orders    None       Claude MangesSoto, Starlina Lapre, PA-C 03/29/18 1525    Terrilee FilesButler, Michael C, MD 03/29/18 1800

## 2018-03-29 NOTE — BH Assessment (Signed)
Tele Assessment Note   Patient Name: Erin Ponce MRN: [id number] Referring Physician: Claude MangesSoto, Johana, PA-C Location of Patient: [facility] Location of Provider: Behavioral Health TTS Department  Erin Ponce is an 22 y.o. female present to the [facility] with complaint of depression and paranoia. Patient a poor historian with tangential language and word salad. Patient presented with paranoid thinking and beliefs. Per EDP note 'Patient states she has been feeling like she is not around the same timeline as she was before.  Patient reports she feels this way because she has not seen her daughter and her daughter's father in the same room.  Patient states this started on Saturday after she had gone to the club, she denies drinking but states she feels like she needs a break from her daughter.  Patient states "I love my daughter very much, but I feel like he needed a break sometimes" ,patient states she feels like she should not be talking to people.  Patient denies any suicidal ideations, homicidal ideations, and denies having a plan or a past history of.'    Diagnosis:  F23   Brief psychotic disorder  Past Medical History:  Past Medical History:  Diagnosis Date  . Anxiety   . Dislocated knee 2008   R knee  . Miscarriage     Past Surgical History:  Procedure Laterality Date  . NO PAST SURGERIES      Family History:  Family History  Problem Relation Age of Onset  . Diabetes Mother   . Thyroid disease Mother     Social History:  reports that she has never smoked. She has never used smokeless tobacco. She reports that she has current or past drug history. Drug: Marijuana. She reports that she does not drink alcohol.  Additional Social History:  Alcohol / Drug Use Pain Medications: see MAR Prescriptions: see MAR Over the Counter: see MAR History of alcohol / drug use?: Yes Substance #1 Name of Substance 1: THC 1 - Age of First Use: UTA 1 - Amount (size/oz): unknown 1 - Frequency:  unknown 1 - Duration: unknown  1 - Last Use / Amount: unknown (patient UDS's positive)   CIWA: CIWA-Ar BP: (!) 150/98 Pulse Rate: (!) 104 COWS:    Allergies:  Allergies  Allergen Reactions  . Latex Itching    Home Medications:  (Not in a hospital admission)  OB/GYN Status:  No LMP recorded.  General Assessment Data Assessment unable to be completed: Yes Reason for not completing assessment: Patient given Ativan, unable to awaken.  [facility] of Assessment: [facility] Assessment: In system Is this a Tele or Face-to-Face Assessment?: Face-to-Face Is this an Initial Assessment or a Re-assessment for this encounter?: Initial Assessment Marital status: Single Is patient pregnant?: No Pregnancy Status: No Living Arrangements: Other (Comment)(unknown) Can pt return to current living arrangement?: (UTA) Admission Status: Voluntary Is patient capable of signing voluntary admission?: Yes Referral Source: Self/Family/Friend Insurance type: Medicaid      Crisis Care Plan Living Arrangements: Other (Comment)(unknown) Legal Guardian: Other:(self) Name of Psychiatrist: unknown Name of Therapist: unknown  Education Status Is patient currently in school?: No  Risk to self with the past 6 months Suicidal Ideation: No Has patient been a risk to self within the past 6 months prior to admission? : No Suicidal Intent: No Has patient had any suicidal intent within the past 6 months prior to admission? : No Is patient at risk for suicide?: No Suicidal Plan?: No Has patient had any suicidal plan within the  past 6 months prior to admission? : No Access to Means: No What has been your use of drugs/alcohol within the last 12 months?: THC Previous Attempts/Gestures: No How many times?: 0 Other Self Harm Risks: UTA Triggers for Past Attempts: Unknown Intentional Self Injurious Behavior: None Family Suicide History: Unable to assess Recent stressful life event(s): (UTA) Persecutory  voices/beliefs?: (patient denie) Depression: (patient denies ) Depression Symptoms: (patient denies') Substance abuse history and/or treatment for substance abuse?: No Suicide prevention information given to non-admitted patients: Not applicable  Risk to Others within the past 6 months Homicidal Ideation: No Does patient have any lifetime risk of violence toward others beyond the six months prior to admission? : No Thoughts of Harm to Others: No Current Homicidal Intent: No Current Homicidal Plan: No Access to Homicidal Means: No Identified Victim: n/a History of harm to others?: No Assessment of Violence: None Noted Violent Behavior Description: none noted Does patient have access to weapons?: No Criminal Charges Pending?: No Does patient have a court date: No Is patient on probation?: No  Psychosis Hallucinations: None noted Delusions: None noted  Mental Status Report Appearance/Hygiene: In scrubs Eye Contact: Fair Motor Activity: Freedom of movement Speech: Tangential Level of Consciousness: Alert Mood: Other (Comment)(Bizarre) Affect: Unable to Assess Anxiety Level: (anxious ) Thought Processes: Tangential Judgement: Impaired Orientation: Place Obsessive Compulsive Thoughts/Behaviors: Unable to Assess  Cognitive Functioning Concentration: Decreased Memory: Unable to Assess Is patient IDD: No Is patient DD?: No Insight: Unable to Assess Impulse Control: Unable to Assess Appetite: Fair Have you had any weight changes? : No Change Sleep: Decreased(Patient report she has not been sleeping) Vegetative Symptoms: None  ADLScreening [facility] Assessment Services) Patient's cognitive ability adequate to safely complete daily activities?: Yes Patient able to express need for assistance with ADLs?: Yes Independently performs ADLs?: Yes (appropriate for developmental age)  Prior Inpatient Therapy Prior Inpatient Therapy: (UTA)  Prior Outpatient Therapy Prior Outpatient  Therapy: (UTA)  ADL Screening (condition at time of admission) Patient's cognitive ability adequate to safely complete daily activities?: Yes Is the patient deaf or have difficulty hearing?: No Does the patient have difficulty seeing, even when wearing glasses/contacts?: No Does the patient have difficulty concentrating, remembering, or making decisions?: No Patient able to express need for assistance with ADLs?: Yes Does the patient have difficulty dressing or bathing?: No Independently performs ADLs?: Yes (appropriate for developmental age) Does the patient have difficulty walking or climbing stairs?: No       Abuse/Neglect Assessment (Assessment to be complete while patient is alone) Abuse/Neglect Assessment Can Be Completed: Unable to assess, patient is non-responsive or altered mental status     Advance Directives (For Healthcare) Does Patient Have a Medical Advance Directive?: No Would patient like information on creating a medical advance directive?: No - Patient declined          Disposition:  Disposition Initial Assessment Completed for this Encounter: Kandis Nab, NP, overnight observation )   Ocie Cornfield [facility] 03/29/2018 6:07 PM

## 2018-03-29 NOTE — [facility]HH Counselor (Signed)
Patient given Ativan per Kendal HymenEdie, RN, about 15 minutes ago (12:40pm). Patient unable to be awaken after calling her name multiple times. TTS will attempt to re-assess patient after she awakes.

## 2018-03-29 NOTE — ED Notes (Signed)
Bed: [facility]43 Expected date:  Expected time:  Means of arrival:  Comments: 

## 2018-03-29 NOTE — ED Triage Notes (Signed)
Patient reports feeling like someone is drugging her tea. Pt reports PTSD and states she is stuck in a 2 year time line since she lost her daughter. Pt states she feels like she is burning up from the inside " I think I have lupus" . Pt states she feels like someone is after her family. Pt resistant to answering questions. States she isn't safe and feels she should leave

## 2018-03-30 ENCOUNTER — Other Ambulatory Visit: Payer: Self-pay

## 2018-03-30 ENCOUNTER — Encounter (HOSPITAL_COMMUNITY): Payer: Self-pay | Admitting: *Deleted

## 2018-03-30 ENCOUNTER — Inpatient Hospital Stay (HOSPITAL_COMMUNITY)
Admission: AD | Admit: 2018-03-30 | Discharge: 2018-04-05 | DRG: 885 | Disposition: A | Discharge status: Home / Self Care | Payer: Medicaid Other | Attending: Psychiatry | Admitting: Psychiatry

## 2018-03-30 DIAGNOSIS — F23 Brief psychotic disorder: Secondary | ICD-10-CM | POA: Diagnosis present

## 2018-03-30 DIAGNOSIS — Z62810 Personal history of physical and sexual abuse in childhood: Secondary | ICD-10-CM

## 2018-03-30 DIAGNOSIS — F333 Major depressive disorder, recurrent, severe with psychotic symptoms: Secondary | ICD-10-CM | POA: Diagnosis present

## 2018-03-30 DIAGNOSIS — Z91040 Latex allergy status: Secondary | ICD-10-CM

## 2018-03-30 DIAGNOSIS — F419 Anxiety disorder, unspecified: Secondary | ICD-10-CM | POA: Diagnosis present

## 2018-03-30 DIAGNOSIS — R450 Nervousness: Secondary | ICD-10-CM | POA: Diagnosis not present

## 2018-03-30 DIAGNOSIS — F22 Delusional disorders: Secondary | ICD-10-CM | POA: Diagnosis not present

## 2018-03-30 DIAGNOSIS — F4310 Post-traumatic stress disorder, unspecified: Secondary | ICD-10-CM | POA: Diagnosis present

## 2018-03-30 DIAGNOSIS — G4700 Insomnia, unspecified: Secondary | ICD-10-CM | POA: Diagnosis present

## 2018-03-30 DIAGNOSIS — F29 Unspecified psychosis not due to a substance or known physiological condition: Secondary | ICD-10-CM | POA: Diagnosis present

## 2018-03-30 DIAGNOSIS — R45851 Suicidal ideations: Secondary | ICD-10-CM | POA: Diagnosis not present

## 2018-03-30 MED ORDER — TRAZODONE HCL 50 MG PO TABS
50.0000 mg | ORAL_TABLET | Freq: Every evening | ORAL | Status: DC | PRN
  Administered 2018-03-30: 50 mg via ORAL
  Filled 2018-03-30: qty 1

## 2018-03-30 MED ORDER — MAGNESIUM HYDROXIDE 400 MG/5ML PO SUSP: 30.0000 mL | Freq: Every day | ORAL | Status: DC | PRN

## 2018-03-30 MED ORDER — HYDROXYZINE HCL 25 MG PO TABS
25.0000 mg | ORAL_TABLET | Freq: Three times a day (TID) | ORAL | Status: DC | PRN
  Administered 2018-03-31 – 2018-04-04 (×6): 25 mg via ORAL
  Filled 2018-03-30 (×3): qty 1
  Filled 2018-03-30: qty 4

## 2018-03-30 MED ORDER — LORAZEPAM 2 MG/ML IJ SOLN
1.0000 mg | Freq: Four times a day (QID) | INTRAMUSCULAR | Status: DC | PRN
  Filled 2018-03-30: qty 1

## 2018-03-30 MED ORDER — DIPHENHYDRAMINE HCL 50 MG/ML IJ SOLN: 50.0000 mg | Freq: Two times a day (BID) | INTRAMUSCULAR | Status: DC | PRN

## 2018-03-30 MED ORDER — HALOPERIDOL 5 MG PO TABS
5.0000 mg | ORAL_TABLET | Freq: Four times a day (QID) | ORAL | Status: DC | PRN
  Administered 2018-04-02 – 2018-04-03 (×2): 5 mg via ORAL
  Filled 2018-03-30: qty 1

## 2018-03-30 MED ORDER — ZIPRASIDONE MESYLATE 20 MG IM SOLR: 20.0000 mg | Freq: Two times a day (BID) | INTRAMUSCULAR | Status: DC | PRN

## 2018-03-30 MED ORDER — DIPHENHYDRAMINE HCL 50 MG/ML IJ SOLN: 25.0000 mg | Freq: Four times a day (QID) | INTRAMUSCULAR | Status: DC | PRN

## 2018-03-30 MED ORDER — LORAZEPAM 1 MG PO TABS
1.0000 mg | ORAL_TABLET | Freq: Four times a day (QID) | ORAL | Status: DC | PRN
  Administered 2018-03-30 – 2018-04-02 (×2): 1 mg via ORAL
  Filled 2018-03-30: qty 4
  Filled 2018-03-30: qty 1

## 2018-03-30 MED ORDER — ACETAMINOPHEN 325 MG PO TABS
650.0000 mg | ORAL_TABLET | Freq: Four times a day (QID) | ORAL | Status: DC | PRN
  Administered 2018-04-02: 650 mg via ORAL
  Filled 2018-03-30: qty 2

## 2018-03-30 MED ORDER — HALOPERIDOL LACTATE 5 MG/ML IJ SOLN: 5.0000 mg | Freq: Four times a day (QID) | INTRAMUSCULAR | Status: DC | PRN

## 2018-03-30 MED ORDER — ALUM & MAG HYDROXIDE-SIMETH 200-200-20 MG/5ML PO SUSP: 30.0000 mL | ORAL | Status: DC | PRN

## 2018-03-30 MED ORDER — DIPHENHYDRAMINE HCL 25 MG PO CAPS
25.0000 mg | ORAL_CAPSULE | Freq: Four times a day (QID) | ORAL | Status: DC | PRN
  Administered 2018-04-05: 25 mg via ORAL
  Filled 2018-03-30: qty 1

## 2018-03-30 MED ORDER — LORAZEPAM 2 MG/ML IJ SOLN: 2.0000 mg | Freq: Two times a day (BID) | INTRAMUSCULAR | Status: DC | PRN

## 2018-03-30 NOTE — Progress Notes (Signed)
Did not  Attend group 

## 2018-03-30 NOTE — ED Notes (Signed)
Pt resting in bed with even and unlabored respirations. No distress noted. Will continue to monitor

## 2018-03-30 NOTE — Tx Team (Signed)
Initial Treatment Plan 03/30/2018 7:26 PM Erin Ponce Eye [id number]    PATIENT STRESSORS: Marital or family conflict Substance abuse   PATIENT STRENGTHS: Wellsite geologistCommunication skills General fund of knowledge Motivation for treatment/growth Physical Health Supportive family/friends   PATIENT IDENTIFIED PROBLEMS: "I don't need any help"                     DISCHARGE CRITERIA:  Ability to meet basic life and health needs Adequate post-discharge living arrangements Improved stabilization in mood, thinking, and/or behavior Medical problems require only outpatient monitoring Motivation to continue treatment in a less acute level of care Need for constant or close observation no longer present Safe-care adequate arrangements made  PRELIMINARY DISCHARGE PLAN: Outpatient therapy Placement in alternative living arrangements Return to previous living arrangement Return to previous work or school arrangements  PATIENT/FAMILY INVOLVEMENT: This treatment plan has been presented to and reviewed with the patient, Erin Ponce,   The patient  Has been given the opportunity to ask questions and make suggestions.  Carlisle CaterErika C Brayan Votaw, RN 03/30/2018, 7:26 PM

## 2018-03-30 NOTE — ED Notes (Signed)
Pt is talking on phone

## 2018-03-30 NOTE — Consult Note (Addendum)
[place] Psychiatry Consult   Reason for Consult:  Psychosis Referring Physician:  EDP Patient Identification: Erin Ponce MRN:  [id number] Principal Diagnosis: Psychosis [facility]) Diagnosis:   Patient Active Problem List   Diagnosis Date Noted  . Psychosis ([place]) [F29] 03/30/2018    Total Time spent with patient: 45 minutes  Subjective:   Erin Ponce is a 22 y.o. female patient admitted with acute psychosis.  HPI:  Pt was seen and chart reviewed with treatment team and Dr Mariea Clonts. Pt denied suicidal and homicidal ideation and denied auditory/visual hallucinations but Pt does appear to be responding to internal stimuli. Pt stated she needs to see her daughter. Pt's daughter is 65 months old and currently with the baby's father who lives one and one half hour away. Pt stated she needs a break form her daughter and that is why she goes out to the club. Pt's UDS positive for THC, BAL negative. Pt denies any psychiatric medications but endorses PTSD diagnosis. Pt is paranoid and anxious. Pt stated she feels like her cell phone is hacked and was seen talking in the air but she stated she was talking on her cell phone. Pt would benefit from an inpatient psychiatric admission for crisis stabilization and medication management.   Past Psychiatric History: As above  Risk to Self: Suicidal Ideation: No Suicidal Intent: No Is patient at risk for suicide?: No Suicidal Plan?: No Access to Means: No What has been your use of drugs/alcohol within the last 12 months?: THC How many times?: 0 Other Self Harm Risks: UTA Triggers for Past Attempts: Unknown Intentional Self Injurious Behavior: None Risk to Others: Homicidal Ideation: No Thoughts of Harm to Others: No Current Homicidal Intent: No Current Homicidal Plan: No Access to Homicidal Means: No Identified Victim: n/a History of harm to others?: No Assessment of Violence: None Noted Violent Behavior Description: none noted Does  patient have access to weapons?: No Criminal Charges Pending?: No Does patient have a court date: No Prior Inpatient Therapy: Prior Inpatient Therapy: (UTA) Prior Outpatient Therapy: Prior Outpatient Therapy: (UTA)  Past Medical History:  Past Medical History:  Diagnosis Date  . Anxiety   . Dislocated knee 2008   R knee  . Miscarriage     Past Surgical History:  Procedure Laterality Date  . NO PAST SURGERIES     Family History:  Family History  Problem Relation Age of Onset  . Diabetes Mother   . Thyroid disease Mother    Family Psychiatric  History: Reports mental illness on maternal side.  Social History:  Social History   Substance and Sexual Activity  Alcohol Use No     Social History   Substance and Sexual Activity  Drug Use Yes  . Types: Marijuana   Comment: recently stopped    Social History   Socioeconomic History  . Marital status: Single    Spouse name: Not on file  . Number of children: Not on file  . Years of education: Not on file  . Highest education level: Not on file  Occupational History  . Not on file  Social Needs  . Financial resource strain: Not on file  . Food insecurity:    Worry: Not on file    Inability: Not on file  . Transportation needs:    Medical: Not on file    Non-medical: Not on file  Tobacco Use  . Smoking status: Never Smoker  . Smokeless tobacco: Never Used  Substance and Sexual Activity  . Alcohol  use: No  . Drug use: Yes    Types: Marijuana    Comment: recently stopped  . Sexual activity: Yes    Birth control/protection: None  Lifestyle  . Physical activity:    Days per week: Not on file    Minutes per session: Not on file  . Stress: Not on file  Relationships  . Social connections:    Talks on phone: Not on file    Gets together: Not on file    Attends religious service: Not on file    Active member of club or organization: Not on file    Attends meetings of clubs or organizations: Not on file     Relationship status: Not on file  Other Topics Concern  . Not on file  Social History Narrative  . Not on file   Additional Social History: She lives at home with her mom. Her daughter is currently with her daughter's father although she reports having custody of her daughter. She dances at a strip club. She denies alcohol use. She reports last using marijuana on Saturday.     Allergies:   Allergies  Allergen Reactions  . Latex Itching    Labs:  Results for orders placed or performed during the hospital encounter of 03/29/18 (from the past 48 hour(s))  Rapid urine drug screen (hospital performed)     Status: Abnormal   Collection Time: 03/29/18 12:50 PM  Result Value Ref Range   Opiates NONE DETECTED NONE DETECTED   Cocaine NONE DETECTED NONE DETECTED   Benzodiazepines NONE DETECTED NONE DETECTED   Amphetamines NONE DETECTED NONE DETECTED   Tetrahydrocannabinol POSITIVE (A) NONE DETECTED   Barbiturates NONE DETECTED NONE DETECTED    Comment: (NOTE) DRUG SCREEN FOR MEDICAL PURPOSES ONLY.  IF CONFIRMATION IS NEEDED FOR ANY PURPOSE, NOTIFY LAB WITHIN 5 DAYS. LOWEST DETECTABLE LIMITS FOR URINE DRUG SCREEN Drug Class                     Cutoff (ng/mL) Amphetamine and metabolites    1000 Barbiturate and metabolites    200 Benzodiazepine                 497 Tricyclics and metabolites     300 Opiates and metabolites        300 Cocaine and metabolites        300 THC                            50 Performed at [facility], [place] [address] [place], [place] [address]   Comprehensive metabolic panel     Status: None   Collection Time: 03/29/18  1:05 PM  Result Value Ref Range   Sodium 139 135 - 145 mmol/L   Potassium 4.1 3.5 - 5.1 mmol/L   Chloride 103 98 - 111 mmol/L   CO2 27 22 - 32 mmol/L   Glucose, Bld 99 70 - 99 mg/dL   BUN 9 6 - 20 mg/dL   Creatinine, Ser 0.78 0.44 - 1.00 mg/dL   Calcium 9.4 8.9 - 10.3 mg/dL   Total Protein 7.0 6.5 - 8.1 g/dL    Albumin 4.2 3.5 - 5.0 g/dL   AST 27 15 - 41 U/L   ALT 17 0 - 44 U/L   Alkaline Phosphatase 65 38 - 126 U/L   Total Bilirubin 0.5 0.3 - 1.2 mg/dL   GFR calc non Af Amer >60 >  60 mL/min   GFR calc Af Amer >60 >60 mL/min    Comment: (NOTE) The eGFR has been calculated using the CKD EPI equation. This calculation has not been validated in all clinical situations. eGFR's persistently <60 mL/min signify possible Chronic Kidney Disease.    Anion gap 9 5 - 15    Comment: Performed at [facility], [place] [address] [place], [place] [address]  Ethanol     Status: None   Collection Time: 03/29/18  1:05 PM  Result Value Ref Range   Alcohol, Ethyl (B) <10 <10 mg/dL    Comment: (NOTE) Lowest detectable limit for serum alcohol is 10 mg/dL. For medical purposes only. Performed at [facility], [place] [address] [place], [place] [address]   Salicylate level     Status: None   Collection Time: 03/29/18  1:05 PM  Result Value Ref Range   Salicylate Lvl <6.0 2.8 - 30.0 mg/dL    Comment: Performed at [facility], [place] [address] [place], [place] [address]  Acetaminophen level     Status: Abnormal   Collection Time: 03/29/18  1:05 PM  Result Value Ref Range   Acetaminophen (Tylenol), Serum <10 (L) 10 - 30 ug/mL    Comment: (NOTE) Therapeutic concentrations vary significantly. A range of 10-30 ug/mL  may be an effective concentration for many patients. However, some  are best treated at concentrations outside of this range. Acetaminophen concentrations >150 ug/mL at 4 hours after ingestion  and >50 ug/mL at 12 hours after ingestion are often associated with  toxic reactions. Performed at [facility], [place] [address] [place], [place] [address]   cbc     Status: None   Collection Time: 03/29/18  1:05 PM  Result Value Ref Range   WBC 6.6 4.0 - 10.5 K/uL   RBC 4.53 3.87 - 5.11 MIL/uL   Hemoglobin 12.5 12.0 - 15.0 g/dL    HCT 39.5 36.0 - 46.0 %   MCV 87.2 78.0 - 100.0 fL   MCH 27.6 26.0 - 34.0 pg   MCHC 31.6 30.0 - 36.0 g/dL   RDW 14.5 11.5 - 15.5 %   Platelets 315 150 - 400 K/uL    Comment: Performed at [facility], [place] [address], [place], [place] [address]  hCG, quantitative, pregnancy     Status: None   Collection Time: 03/29/18  1:05 PM  Result Value Ref Range   hCG, Beta Chain, Quant, S <1 <5 mIU/mL    Comment:          GEST. AGE      CONC.  (mIU/mL)   <=1 WEEK        5 - 50     2 WEEKS       50 - 500     3 WEEKS       100 - 10,000     4 WEEKS     1,000 - 30,000     5 WEEKS     3,500 - 115,000   6-8 WEEKS     12,000 - 270,000    12 WEEKS     15,000 - 220,000        FEMALE AND NON-PREGNANT FEMALE:     LESS THAN 5 mIU/mL Performed at [facility], [place] [address] [place], [place] [address]     Current Facility-Administered Medications  Medication Dose Route Frequency Provider Last Rate Last Dose  . ziprasidone (GEODON)  injection 20 mg  20 mg Intramuscular BID PRN Faythe Dingwall, DO       And  . LORazepam (ATIVAN) injection 2 mg  2 mg Intramuscular BID PRN Faythe Dingwall, DO       And  . diphenhydrAMINE (BENADRYL) injection 50 mg  50 mg Intramuscular BID PRN Faythe Dingwall, DO      . hydrOXYzine (ATARAX/VISTARIL) tablet 25 mg  25 mg Oral TID [name] Hal, NP   25 mg at 03/30/18 0951  . traZODone (DESYREL) tablet 100 mg  100 mg Oral QHS [name] Hal, NP   100 mg at 03/29/18 2150   Current Outpatient Medications  Medication Sig Dispense Refill  . IRON, FERROUS SULFATE, PO Take 1 tablet by mouth daily.    . Omega-3 Fatty Acids (OMEGA-3 FISH OIL PO) Take 1 capsule by mouth daily.    . Prenatal Vit-Fe Fumarate-FA (PRENATAL MULTIVITAMIN) TABS tablet Take 1 tablet by mouth daily at 12 noon.     . cyclobenzaprine (FLEXERIL) 5 MG tablet Take 1 tablet (5 mg total) by mouth 3 (three) times daily as needed for muscle spasms.  (Patient not taking: Reported on 12/04/2017) 15 tablet 0  . naproxen (NAPROSYN) 500 MG tablet Take 1 tablet (500 mg total) by mouth 2 (two) times daily. (Patient not taking: Reported on 12/04/2017) 20 tablet 0  . traMADol (ULTRAM) 50 MG tablet Take 1 tablet (50 mg total) by mouth every 6 (six) hours as needed. (Patient not taking: Reported on 03/26/2018) 8 tablet 0    Musculoskeletal: Strength & Muscle Tone: within normal limits Gait & Station: normal Patient leans: N/A  Psychiatric Specialty Exam: Physical Exam  Nursing note and vitals reviewed. Constitutional: She is oriented to person, place, and time. She appears well-developed and well-nourished.  HENT:  Head: Normocephalic and atraumatic.  Respiratory: Effort normal.  Musculoskeletal: Normal range of motion.  Neurological: She is alert and oriented to person, place, and time.  Psychiatric: Her speech is normal and behavior is normal. Her mood appears anxious. Thought content is paranoid. Cognition and memory are normal. She expresses impulsivity. She exhibits a depressed mood.    Review of Systems  Psychiatric/Behavioral: Positive for depression, hallucinations and substance abuse. Negative for memory loss and suicidal ideas. The patient is nervous/anxious. The patient does not have insomnia.   All other systems reviewed and are negative.   Blood pressure (!) 144/108, pulse 100, temperature 98.1 F (36.7 C), temperature source Oral, resp. rate 18, height _0  (1.575 m), weight 120 lb (54.4 kg), SpO2 100 %, currently breastfeeding.Body mass index is 21.95 kg/m.  General Appearance: Casual  Eye Contact:  Good  Speech:  Clear and Coherent and Normal Rate  Volume:  Decreased  Mood:  Depressed and Dysphoric  Affect:  Congruent, Depressed and Flat  Thought Process:  Disorganized and Descriptions of Associations: Loose  Orientation:  Full (Time, Place, and Person)  Thought Content:  Illogical  Suicidal Thoughts:  No  Homicidal  Thoughts:  No  Memory:  Immediate;   Fair Recent;   Fair Remote;   Fair  Judgement:  Poor  Insight:  Lacking  Psychomotor Activity:  Decreased  Concentration:  Concentration: Fair and Attention Span: Fair  Recall:  Good  Fund of Knowledge:  Good  Language:  Good  Akathisia:  No  Handed:  Right  AIMS (if indicated):   N/A  Assets:  Agricultural consultant Housing Social Support  ADL's:  Intact  Cognition:  WNL  Sleep:   Poor     Treatment Plan Summary: Daily contact with patient to assess and evaluate symptoms and progress in treatment and Medication management (see MAR)  Disposition: Recommend psychiatric Inpatient admission when medically cleared. Pt accepted to [facility] bed 505-1  [name] Hal, NP 03/30/2018 2:12 PM   Patient seen face-to-face for psychiatric evaluation, chart reviewed and case discussed with the physician extender and developed treatment plan. Reviewed the information documented and agree with the treatment plan.  Buford Dresser, DO 03/30/18 6:47 PM

## 2018-03-30 NOTE — ED Notes (Signed)
Pt is in  hallway and continues to ask questions, that have previously been answered for her.

## 2018-03-30 NOTE — ED Notes (Signed)
Pt behavior bizarre, delusional , paranoid, requiring constant redirection. Speech disorganized, rapid. Dr.Norman aware of pt behavior. Special checks q 15 mins in place for safety, video monitoring in place. Will continue to monitor.

## 2018-03-30 NOTE — ED Notes (Signed)
[employer] on unit to transfer pt to [facility] per MD order. Personal property given to [employer] for transfer. Ambulatory off unit in police custody.  

## 2018-03-30 NOTE — BH Assessment (Signed)
[facility]  Per Juanetta BeetsJacqueline Norman, DO, this pt requires psychiatric hospitalization.  Dr Sharma CovertNorman also finds that pt meets criteria for IVC, which shehas initiated.  IVC documents have been faxed to [facility] Magistrate, and at [employer]13:18 Magistrate Hay[place] confirms receipt.  She has since faxed Findings and Custody Order to this Clinical research associatewriter.  At 13:35I called [employer] and spoke to Amerisource[employer]ergen Corporationperator [id number], who took demographic information, agreeing to dispatch law enforcement to fill out Return of Service.  [facility].  Malva LimesLinsey Strader, RN, [facility]; [facility] will be ready to receive at 15:30.  [facility].  Pt's nurse, Morrie Sheldonshley, has been notified, and agrees to call report to [phone].  Pt is to be transported via Patent examinerlaw enforcement.   Doylene Canninghomas Depaul Arizpe, [place] Behavioral Health Coordinator [phone]

## 2018-03-30 NOTE — Progress Notes (Signed)
Pt. Is a 22 year old female IVC admission for increasing depression and paranoia.  Pt. Is pleasant on approach but becomes more paranoid during assessment. She is tangential and speaks of dealing with with "situations now from another timeline".  [facility] but denies [facility] or tobacco use.  Pt. Became tearful when speaking of her daughter, stating that she had not seen her "since Saturday" (6 days ago) and all she wants to do is take care of her.  Pt. Denies history of abuse. States she does have difficulty sleeping, "especially without my daughter".  Pt. Reports a decreased appetite but no significant weight loss, she reports that her weight "fluctuates".  Pt. Oriented to the unit without incident and with safety maintained.

## 2018-03-30 NOTE — ED Notes (Signed)
Pt taking a shower 

## 2018-03-30 NOTE — ED Notes (Signed)
Snack was given to the pt.

## 2018-03-31 DIAGNOSIS — R450 Nervousness: Secondary | ICD-10-CM

## 2018-03-31 DIAGNOSIS — R45851 Suicidal ideations: Secondary | ICD-10-CM

## 2018-03-31 DIAGNOSIS — F333 Major depressive disorder, recurrent, severe with psychotic symptoms: Secondary | ICD-10-CM | POA: Diagnosis present

## 2018-03-31 DIAGNOSIS — F23 Brief psychotic disorder: Secondary | ICD-10-CM

## 2018-03-31 LAB — TSH: TSH: 1.228 u[IU]/mL (ref 0.350–4.500)

## 2018-03-31 MED ORDER — TRAZODONE HCL 100 MG PO TABS
100.0000 mg | ORAL_TABLET | Freq: Every day | ORAL | Status: DC
  Filled 2018-03-31: qty 1

## 2018-03-31 MED ORDER — RISPERIDONE 1 MG PO TABS
1.0000 mg | ORAL_TABLET | Freq: Every day | ORAL | Status: DC
  Administered 2018-03-31 – 2018-04-01 (×2): 1 mg via ORAL
  Filled 2018-03-31 (×3): qty 1

## 2018-03-31 MED ORDER — MAGNESIUM HYDROXIDE 400 MG/5ML PO SUSP: 30.0000 mL | Freq: Every day | ORAL | Status: DC | PRN

## 2018-03-31 MED ORDER — LORAZEPAM 2 MG/ML IJ SOLN
1.0000 mg | Freq: Once | INTRAMUSCULAR | Status: AC
  Administered 2018-03-31: 1 mg via INTRAMUSCULAR

## 2018-03-31 MED ORDER — ALUM & MAG HYDROXIDE-SIMETH 200-200-20 MG/5ML PO SUSP: 30.0000 mL | ORAL | Status: DC | PRN

## 2018-03-31 MED ORDER — ACETAMINOPHEN 325 MG PO TABS: 650.0000 mg | ORAL_TABLET | Freq: Four times a day (QID) | ORAL | Status: DC | PRN

## 2018-03-31 MED ORDER — TRAZODONE HCL 50 MG PO TABS
50.0000 mg | ORAL_TABLET | Freq: Every evening | ORAL | Status: DC | PRN
  Administered 2018-04-01 – 2018-04-02 (×3): 50 mg via ORAL
  Filled 2018-03-31 (×2): qty 1
  Filled 2018-03-31: qty 5

## 2018-03-31 MED ORDER — ZIPRASIDONE MESYLATE 20 MG IM SOLR: 20.0000 mg | Freq: Two times a day (BID) | INTRAMUSCULAR | Status: DC | PRN

## 2018-03-31 MED ORDER — LORAZEPAM 2 MG/ML IJ SOLN: 2.0000 mg | Freq: Two times a day (BID) | INTRAMUSCULAR | Status: DC | PRN

## 2018-03-31 MED ORDER — HYDROXYZINE HCL 25 MG PO TABS
25.0000 mg | ORAL_TABLET | Freq: Three times a day (TID) | ORAL | Status: DC
  Administered 2018-03-31 – 2018-04-02 (×6): 25 mg via ORAL
  Filled 2018-03-31 (×14): qty 1

## 2018-03-31 MED ORDER — DIPHENHYDRAMINE HCL 50 MG/ML IJ SOLN: 50.0000 mg | Freq: Two times a day (BID) | INTRAMUSCULAR | Status: DC | PRN

## 2018-03-31 MED ORDER — RISPERIDONE 0.5 MG PO TABS
0.5000 mg | ORAL_TABLET | Freq: Every day | ORAL | Status: DC
  Administered 2018-03-31: 0.5 mg via ORAL
  Filled 2018-03-31 (×3): qty 1

## 2018-03-31 MED ORDER — SERTRALINE HCL 50 MG PO TABS
50.0000 mg | ORAL_TABLET | Freq: Every day | ORAL | Status: DC
  Administered 2018-03-31 – 2018-04-02 (×3): 50 mg via ORAL
  Filled 2018-03-31 (×6): qty 1
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
## 2018-03-31 NOTE — Progress Notes (Signed)
D: When asked about his day pt stated, "I had a good day". Pt was quiet and spoke in a low soft voice. Pt informed the writer that he wants to get something for sleep. Writer discussed giving trazodone.  A: Writer was given trazodone 50 mg. Support and encouragement was offered. 15 min checks continued for safety.  R: Pt remains safe.

## 2018-03-31 NOTE — Progress Notes (Signed)
Adult Psychoeducational Group Note  Date:  03/31/2018 Time:  8:52 PM  Group Topic/Focus:  Wrap-Up Group:   The focus of this group is to help patients review their daily goal of treatment and discuss progress on daily workbooks.  Participation Level:  Active  Participation Quality:  Appropriate  Affect:  Appropriate  Cognitive:  Appropriate  Insight: Appropriate  Engagement in Group:  Engaged  Modes of Intervention:  Discussion  Additional Comments:  The patient expressed that she rates today a 8.The patient also said that she attend groups and is working on her behavior.  Octavio Mannshigpen, Shanon Seawright Lee 03/31/2018, 8:52 PM
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
## 2018-03-31 NOTE — Plan of Care (Signed)
D: Patient presents calm and cooperative "doing well" in the morning, but had a panic attack around 10:45 this morning. Administered IM Ativan 1mg  for acute panic episode after attempting to de-escalate. This morning, patient's mood was "very good" and she was bright, positive, with organized speech. She spoke with her boyfriend on the phone, and after hanging up, yelled "don't make me have sex." Patient admitted to having hallucinations prior to admission and "I was talking in riddles and not eating or drinking." Patient denies SI/HI/AVH. A: Patient checked q15 min, and checks reviewed. Reviewed medication changes with patient and educated on side effects. Educated patient on importance of attending group therapy sessions and educated on several coping skills. Encouarged participation in milieu through recreation therapy and attending meals with peers. Support and encouragement provided. Fluids offered. R: Patient receptive to education on medications, and is medication compliant. Patient contracts for safety on the unit. [name] resting in bed after administration of Ativan and Risperdal.

## 2018-03-31 NOTE — [facility] Group Notes (Addendum)
LCSW Group Therapy Note  03/31/2018   11:00  Type of Therapy and Topic:  Group Therapy: Anger Cues and Responses  Participation Level:  Did Not Attend   Description of Group:   In this group, patients learned how to recognize the physical, cognitive, emotional, and behavioral responses they have to anger-provoking situations.  They identified a recent time they became angry and how they reacted.  They analyzed how their reaction was possibly beneficial and how it was possibly unhelpful.  The group discussed a variety of healthier coping skills that could help with such a situation in the future.  Deep breathing was practiced briefly.  Therapeutic Goals: 1. Patients will remember their last incident of anger and how they felt emotionally and physically, what their thoughts were at the time, and how they behaved. 2. Patients will identify how their behavior at that time worked for them, as well as how it worked against them. 3. Patients will explore possible new behaviors to use in future anger situations. 4. Patients will learn that anger itself is normal and cannot be eliminated, and that healthier reactions can assist with resolving conflict rather than worsening situations.  Summary of Patient Progress:   N/A  Therapeutic Modalities:   Cognitive Behavioral Therapy  Evorn Gongonnie D Llewellyn Schoenberger

## 2018-03-31 NOTE — [facility] Group Notes (Signed)
[facility] Group Notes:  (Nursing)  Date:  03/31/2018  Time:  430 PM Type of Therapy:  Nurse Education  Participation Level:  Did Not Attend  [name] 03/31/2018, 7:30 PM

## 2018-03-31 NOTE — Progress Notes (Signed)
D.  Pt pleasant on approach, denies complaints at this time.  Pt was positive for evening wrap up group, interacting appropriately with peers on the unit.  Pt denies SI/HI/AVH at this time.  A.  Support and encouragement offered, medication given as ordered  R.  Pt remains safe on the unit, will continue to monitor.   
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
## 2018-04-01 NOTE — Progress Notes (Signed)
D. Pt pleasant but guarded on approach, denies complaints at this time.  Pt was positive for evening wrap up group, observed engaged in appropriate interaction with peers on the unit.  Pt continues to have some bizarre statements, came up as peers were playing board game and asked staff if this was a sin.  Pt denies SI/HI/AVH at this time.  A.  Support and encouragement offered, medication given as ordered  R.  Pt remains safe on the unit, will continue to monitor.

## 2018-04-01 NOTE — [facility] Group Notes (Addendum)
[facility]  Date/Time:  04/01/2018 11:00AM-12:00  Type of Therapy and Topic:  Group Therapy:  Healthy and Unhealthy Supports  Participation Level:  Active   Description of Group:  Patients in this group were introduced to the idea of adding a variety of healthy supports to address the various needs in their lives.Patients discussed what additional healthy supports could be helpful in their recovery and wellness after discharge in order to prevent future hospitalizations.   An emphasis was placed on using counselor, doctor, therapy groups, 12-step groups, and problem-specific support groups to expand supports.  They also worked as a group on developing a specific plan for several patients to deal with unhealthy supports through boundary-setting, psychoeducation with loved ones, and even termination of relationships.   Therapeutic Goals:   1)  discuss importance of adding supports to stay well once out of the hospital  2)  compare healthy versus unhealthy supports and identify some examples of each  3)  generate ideas and descriptions of healthy supports that can be added  4)  offer mutual support about how to address unhealthy supports  5)  encourage active participation in and adherence to discharge plan    Summary of Patient Progress:  The patient stated that current healthy supports in her life are her family while current unhealthy supports include spending too much time watching television .  The patient expressed a willingness to add more routine and structure  as support(s) to help in her recovery journey. There one period where the patient appeared to freeze and asked to be excused. She did return and resumed participating in group.    Therapeutic Modalities:   Motivational Interviewing Brief Solution-Focused Therapy  Evorn Gongonnie D Ewen Varnell

## 2018-04-01 NOTE — Progress Notes (Signed)
D: Patient approached RN stating "I have cancer, I am going to die." When asked what she meant, she said, "You are giving me medication, and now I am going to go to sleep and not wake up, you know what I mean." RN asked if she had a plan to harm herself, which she denied, but she felt that getting the vistaril would kill her. Patient has bizarre and incongruent responses. She appears depressed and hopeless. A: Provided reassurance that patient would not die from taking the vistaril.  R: Patient agreed to lay down and take a nap.

## 2018-04-01 NOTE — Progress Notes (Signed)
Adult Psychoeducational Group Note  Date:  04/01/2018 Time:  10:28 PM  Group Topic/Focus:  Wrap-Up Group:   The focus of this group is to help patients review their daily goal of treatment and discuss progress on daily workbooks.  Participation Level:  Minimal  Participation Quality:  Appropriate  Affect:  Appropriate  Cognitive:  Lacking  Insight: Limited  Engagement in Group:  Engaged  Modes of Intervention:  Socialization and Support  Additional Comments:  Patient attended and participated in group tonight. She reports that today was a 7 for her. She spoke with the doctor, eat more than usual and went to her groups.  Lita MainsFrancis, Mccartney Brucks [facility] 04/01/2018, 10:28 PM
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
## 2018-04-01 NOTE — [facility] Group Notes (Signed)
[facility] Group Notes:  (Nursing/MHT/Case Management/Adjunct)  Date:  04/01/2018  Time:  6:38 PM  Type of Therapy:  Nurse Education  Participation Level:  Did Not Attend   Summary of Progress/Problems: Patient did not attend group on relaxation and meditation.  Erin Ponce 04/01/2018, 6:38 PM 

## 2018-04-01 NOTE — Plan of Care (Signed)
D: Patient presents distant, preoccupied, anxious. She set a goal for today "Staying focused, calm, feel protected without thinking my being here is a plot to get my daughter taken away (stop medications)." She appears to still have some paranoia, and feels her medications are contributing to poor focus. Her sleep is "fair." Appetite "fair," energy normal, concentration good. She rates her depression, anxiety, and hopelessness 0/10. She feels "sometimes worried about family, especially infant when unable to talk on phone." Patient denies SI/HI/AVH.  A: Patient checked q15 min, and checks reviewed. Reviewed medication changes with patient and educated on side effects. Educated patient on importance of attending group therapy sessions and educated on several coping skills. Encouarged participation in milieu through recreation therapy and attending meals with peers. Support and encouragement provided. Fluids offered. R: Patient receptive to education on medications, however, she stated "I only want to take one of the meds with the lowest dose because I feel mentally foggy." Patient encouraged to take her Zoloft, which she did agree to take. She refused Hydroxyzine. "I do not wish to take anymore medication unless to help ensure a better sleep and less panic attacks." Patient contracts for safety on the unit.
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
## 2018-04-02 MED ORDER — SERTRALINE HCL 100 MG PO TABS
100.0000 mg | ORAL_TABLET | Freq: Every day | ORAL | Status: DC
  Administered 2018-04-03: 100 mg via ORAL
  Filled 2018-04-02 (×3): qty 1

## 2018-04-02 MED ORDER — RISPERIDONE 2 MG PO TABS: 2.0000 mg | ORAL_TABLET | Freq: Two times a day (BID) | ORAL | Status: DC

## 2018-04-02 MED ORDER — SERTRALINE HCL 50 MG PO TABS
50.0000 mg | ORAL_TABLET | Freq: Once | ORAL | Status: AC
  Administered 2018-04-02: 50 mg via ORAL
  Filled 2018-04-02 (×2): qty 1

## 2018-04-02 MED ORDER — RISPERIDONE 2 MG PO TABS
2.0000 mg | ORAL_TABLET | ORAL | Status: DC
  Administered 2018-04-02 – 2018-04-04 (×5): 2 mg via ORAL
  Filled 2018-04-02 (×10): qty 1

## 2018-04-02 NOTE — Progress Notes (Signed)
Adult Psychoeducational Group Note  Date:  04/02/2018 Time:  10:31 PM  Group Topic/Focus:  Wrap-Up Group:   The focus of this group is to help patients review their daily goal of treatment and discuss progress on daily workbooks.  Participation Level:  Active  Participation Quality:  Appropriate  Affect:  Appropriate  Cognitive:  Appropriate  Insight: Appropriate  Engagement in Group:  Engaged  Modes of Intervention:  Socialization and Support  Additional Comments:  Patient attended and participated in group tonight. She reports having a good day. She took her medication, went outside with the group, went for her meals and group. She is working on getting back on schedule with her medication.  Lita MainsFrancis, Justyn Langham [facility] 04/02/2018, 10:31 PM

## 2018-04-02 NOTE — Tx Team (Signed)
Interdisciplinary Treatment and Diagnostic Plan Update  04/02/2018 Time of Session: 0830AM Erin Ponce MRN: [id number]  Principal Diagnosis: MDD (major depressive disorder), recurrent, severe, with psychosis ([place])  Secondary Diagnoses: Principal Problem:   MDD (major depressive disorder), recurrent, severe, with psychosis ([place]) Active Problems:   Brief psychotic disorder ([place])   Severe episode of recurrent major depressive disorder, with psychotic features ([place])   Current Medications:  Current Facility-Administered Medications  Medication Dose Route Frequency Provider Last Rate Last Dose  . acetaminophen (TYLENOL) tablet 650 mg  650 mg Oral Q6H PRN Money, Lowry Ram, FNP   650 mg at 04/02/18 0405  . alum & mag hydroxide-simeth (MAALOX/MYLANTA) 200-200-20 MG/5ML suspension 30 mL  30 mL Oral Q4H PRN [name] Hal, NP      . diphenhydrAMINE (BENADRYL) capsule 25 mg  25 mg Oral Q6H PRN Money, Darnelle Maffucci B, FNP      . haloperidol (HALDOL) tablet 5 mg  5 mg Oral Q6H PRN Money, Darnelle Maffucci B, FNP       Or  . haloperidol lactate (HALDOL) injection 5 mg  5 mg Intramuscular Q6H PRN Money, Lowry Ram, FNP      . hydrOXYzine (ATARAX/VISTARIL) tablet 25 mg  25 mg Oral TID PRN Money, Lowry Ram, FNP   25 mg at 04/02/18 0940  . hydrOXYzine (ATARAX/VISTARIL) tablet 25 mg  25 mg Oral TID [name] Hal, NP   25 mg at 04/02/18 0802  . LORazepam (ATIVAN) tablet 1 mg  1 mg Oral Q6H PRN Money, Lowry Ram, FNP   1 mg at 03/30/18 2206  . magnesium hydroxide (MILK OF MAGNESIA) suspension 30 mL  30 mL Oral Daily PRN Money, Lowry Ram, FNP      . risperiDONE (RISPERDAL) tablet 1 mg  1 mg Oral QHS Derrill Center, NP   1 mg at 04/01/18 2200  . sertraline (ZOLOFT) tablet 50 mg  50 mg Oral Daily Cobos, Myer Peer, MD   50 mg at 04/02/18 0802  . traZODone (DESYREL) tablet 50 mg  50 mg Oral QHS PRN Cobos, Myer Peer, MD   50 mg at 04/01/18 2200   PTA Medications: Medications Prior to Admission  Medication Sig  Dispense Refill Last Dose  . cyclobenzaprine (FLEXERIL) 5 MG tablet Take 1 tablet (5 mg total) by mouth 3 (three) times daily as needed for muscle spasms. (Patient not taking: Reported on 12/04/2017) 15 tablet 0 Not Taking at Unknown time  . IRON, FERROUS SULFATE, PO Take 1 tablet by mouth daily.   03/28/2018 at Unknown time  . naproxen (NAPROSYN) 500 MG tablet Take 1 tablet (500 mg total) by mouth 2 (two) times daily. (Patient not taking: Reported on 12/04/2017) 20 tablet 0 Not Taking at Unknown time  . Omega-3 Fatty Acids (OMEGA-3 FISH OIL PO) Take 1 capsule by mouth daily.   03/29/2018 at Unknown time  . Prenatal Vit-Fe Fumarate-FA (PRENATAL MULTIVITAMIN) TABS tablet Take 1 tablet by mouth daily at 12 noon.    03/29/2018 at Unknown time  . traMADol (ULTRAM) 50 MG tablet Take 1 tablet (50 mg total) by mouth every 6 (six) hours as needed. (Patient not taking: Reported on 03/26/2018) 8 tablet 0 Not Taking at Unknown time    Patient Stressors: Marital or family conflict Substance abuse  Patient Strengths: Curator fund of knowledge Motivation for treatment/growth Physical Health Supportive family/friends  Treatment Modalities: Medication Management, Group therapy, Case management,  1 to 1 session with clinician, Psychoeducation, Recreational therapy.   Physician  Treatment Plan for Primary Diagnosis: MDD (major depressive disorder), recurrent, severe, with psychosis ([place]) Long Term Goal(s): Improvement in symptoms so as ready for discharge Improvement in symptoms so as ready for discharge   Short Term Goals: Ability to identify changes in lifestyle to reduce recurrence of condition will improve Ability to verbalize feelings will improve Ability to demonstrate self-control will improve Compliance with prescribed medications will improve Ability to identify changes in lifestyle to reduce recurrence of condition will improve Ability to verbalize feelings will improve Ability to  demonstrate self-control will improve Ability to identify and develop effective coping behaviors will improve Ability to maintain clinical measurements within normal limits will improve Compliance with prescribed medications will improve  Medication Management: Evaluate patient's response, side effects, and tolerance of medication regimen.  Therapeutic Interventions: 1 to 1 sessions, Unit Group sessions and Medication administration.  Evaluation of Outcomes: Not Met  Physician Treatment Plan for Secondary Diagnosis: Principal Problem:   MDD (major depressive disorder), recurrent, severe, with psychosis ([place]) Active Problems:   Brief psychotic disorder ([place])   Severe episode of recurrent major depressive disorder, with psychotic features ([place])  Long Term Goal(s): Improvement in symptoms so as ready for discharge Improvement in symptoms so as ready for discharge   Short Term Goals: Ability to identify changes in lifestyle to reduce recurrence of condition will improve Ability to verbalize feelings will improve Ability to demonstrate self-control will improve Compliance with prescribed medications will improve Ability to identify changes in lifestyle to reduce recurrence of condition will improve Ability to verbalize feelings will improve Ability to demonstrate self-control will improve Ability to identify and develop effective coping behaviors will improve Ability to maintain clinical measurements within normal limits will improve Compliance with prescribed medications will improve     Medication Management: Evaluate patient's response, side effects, and tolerance of medication regimen.  Therapeutic Interventions: 1 to 1 sessions, Unit Group sessions and Medication administration.  Evaluation of Outcomes: Not Met   RN Treatment Plan for Primary Diagnosis: MDD (major depressive disorder), recurrent, severe, with psychosis ([place]) Long Term Goal(s): Knowledge of disease and  therapeutic regimen to maintain health will improve  Short Term Goals: Ability to verbalize frustration and anger appropriately will improve, Ability to demonstrate self-control and Ability to disclose and discuss suicidal ideas  Medication Management: RN will administer medications as ordered by provider, will assess and evaluate patient's response and provide education to patient for prescribed medication. RN will report any adverse and/or side effects to prescribing provider.  Therapeutic Interventions: 1 on 1 counseling sessions, Psychoeducation, Medication administration, Evaluate responses to treatment, Monitor vital signs and CBGs as ordered, Perform/monitor CIWA, COWS, AIMS and Fall Risk screenings as ordered, Perform wound care treatments as ordered.  Evaluation of Outcomes: Not Met   LCSW Treatment Plan for Primary Diagnosis: MDD (major depressive disorder), recurrent, severe, with psychosis ([place]) Long Term Goal(s): Safe transition to appropriate next level of care at discharge, Engage patient in therapeutic group addressing interpersonal concerns.  Short Term Goals: Engage patient in aftercare planning with referrals and resources, Increase ability to appropriately verbalize feelings and Facilitate acceptance of mental health diagnosis and concerns  Therapeutic Interventions: Assess for all discharge needs, 1 to 1 time with Social worker, Explore available resources and support systems, Assess for adequacy in community support network, Educate family and significant other(s) on suicide prevention, Complete Psychosocial Assessment, Interpersonal group therapy.  Evaluation of Outcomes: Not Met  Progress in Treatment: Attending groups: No. Participating in groups: No. Taking medication  as prescribed: Yes. Toleration medication: Yes. Family/Significant other contact made: No, will contact:  family member if pt consents to collateral contact.  Patient understands diagnosis:  Yes. Discussing patient identified problems/goals with staff: Yes. Medical problems stabilized or resolved: Yes. Denies suicidal/homicidal ideation: Yes. Issues/concerns per patient self-inventory: No. Other: n/a   New problem(s) identified: No, Describe:  n/a  New Short Term/Long Term Goal(s):  medication management for mood stabilization; elimination of SI thoughts; development of comprehensive mental wellness/sobriety plan.   Patient Goals:  "I don't need any help."   Discharge Plan or Barriers:   Reason for Continuation of Hospitalization: Anxiety Depression Medication stabilization  Estimated Length of Stay: Wed, 04/04/18  Attendees: Patient: 04/02/2018 11:14 AM  Physician: Dr. Leverne Humbles MD 04/02/2018 11:14 AM  Nursing: Nicoletta Dress RN; Elizabeth RN 04/02/2018 11:14 AM  RN Care Manager:x 04/02/2018 11:14 AM  Social Worker: Janice Norrie LCSW 04/02/2018 11:14 AM  Recreational Therapist: x 04/02/2018 11:14 AM  Other: Lindell Spar NP; Benjamine Mola NP 04/02/2018 11:14 AM  Other:  04/02/2018 11:14 AM  Other: 04/02/2018 11:14 AM    Scribe for Treatment Team: Avelina Laine, LCSW 04/02/2018 11:14 AM

## 2018-04-02 NOTE — Progress Notes (Signed)
Recreation Therapy Notes  Date: 7.29.19 Time: 1000 Location: 500 Hall Dayroom  Group Topic: Wellness  Goal Area(s) Addresses:  Patient will define components of whole wellness. Patient will verbalize benefit of whole wellness.  Intervention:  Music  Activity: Exercise.  LRT lead patients in a series of stretches.  LRT then gave each patient the opportunity to lead the group in an appropriate exercise of their choice.  The group completed four rounds of exercises before concluding with a cool down.    Education: Wellness, Building control surveyorDischarge Planning.   Education Outcome: Acknowledges education/In group clarification offered/Needs additional education.   Clinical Observations/Feedback:  Pt did not attend group.    Caroll RancherMarjette Jayani Rozman, LRT/CTRS         Lillia AbedLindsay, Burton Gahan A 04/02/2018 11:27 AM
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
## 2018-04-02 NOTE — Progress Notes (Signed)
Nursing Progress Note: 7p-7a D: Pt currently presents with a anxious/euthymic/tangential affect and behavior. Pt states "I have some questions for the doctor about my medicines. It's unclear about what I need to ask." Interacting appropriately with the milieu. Pt reports poor sleep during the previous night with current medication regimen. Pt did attend wrap-up group.  A: Pt provided with medications per providers orders. Pt's labs and vitals were monitored throughout the night. Pt supported emotionally and encouraged to express concerns and questions. Pt educated on medications.  R: Pt's safety ensured with 15 minute and environmental checks. Pt currently denies SI, HI, and AVH. Pt verbally contracts to seek staff if SI,HI, or AVH occurs and to consult with staff before acting on any harmful thoughts. Will continue to monitor.

## 2018-04-02 NOTE — Plan of Care (Signed)
Problem: Activity: Goal: Will verbalize the importance of balancing activity with adequate rest periods Outcome: Progressing   Problem: Coping: Goal: Coping ability will improve Outcome: Progressing   Problem: Nutritional: Goal: Ability to achieve adequate nutritional intake will improve Outcome: Progressing D: Pt A & O X3. Presents very intrusive, labile and suspicious /paranoid this shift. Tearful at intervals believed "my daughter has been taken away from me, I miss her so bad". Irritable when redirection is offered, then apologetic later. Took her medication with multiple prompts. Required redirections for intrusive behavior towards peers and staff. Reports fair sleep, good appetite, normal energy and good concentration level. Rates her depression 0/10, hopelessness 0/10 and anxiety 0/10 on self inventory sheet. A: Scheduled and PRN medications given per MD's order with verbal education and effects monitored. Continued support, availability and encouragement offered. Safety checks maintained without incident. R: Pt remains anxious / restless. Still paranoid. Tolerates all PO intake well. POC continues for safety and mood stability.

## 2018-04-03 LAB — LIPID PANEL
Cholesterol: 131 mg/dL (ref 0–200)
HDL: 46 mg/dL (ref >40)
LDL Cholesterol: 79 mg/dL (ref 0–99)
TRIGLYCERIDES: 30 mg/dL (ref <150)
Total CHOL/HDL Ratio: 2.8 RATIO
VLDL: 6 mg/dL (ref 0–40)

## 2018-04-03 LAB — HEMOGLOBIN A1C
Hgb A1c MFr Bld: 5.4 % (ref 4.8–5.6)
Mean Plasma Glucose: 108.28 mg/dL

## 2018-04-03 MED ORDER — PAROXETINE HCL 20 MG PO TABS
20.0000 mg | ORAL_TABLET | ORAL | Status: DC
  Administered 2018-04-03 – 2018-04-05 (×3): 20 mg via ORAL
  Filled 2018-04-03 (×5): qty 1

## 2018-04-03 MED ORDER — PALIPERIDONE PALMITATE ER 234 MG/1.5ML IM SUSY
234.0000 mg | PREFILLED_SYRINGE | Freq: Once | INTRAMUSCULAR | Status: DC
  Filled 2018-04-03 (×2): qty 1.5

## 2018-04-03 MED ORDER — TRAZODONE HCL 100 MG PO TABS
100.0000 mg | ORAL_TABLET | Freq: Every day | ORAL | Status: DC
  Administered 2018-04-03 – 2018-04-04 (×2): 100 mg via ORAL
  Filled 2018-04-03: qty 6
  Filled 2018-04-03 (×4): qty 1

## 2018-04-03 MED ORDER — PALIPERIDONE PALMITATE ER 156 MG/ML IM SUSY: 156.0000 mg | PREFILLED_SYRINGE | Freq: Once | INTRAMUSCULAR | Status: DC

## 2018-04-03 MED ORDER — PALIPERIDONE PALMITATE ER 234 MG/1.5ML IM SUSY
234.0000 mg | PREFILLED_SYRINGE | Freq: Once | INTRAMUSCULAR | Status: AC
  Administered 2018-04-04: 234 mg via INTRAMUSCULAR

## 2018-04-03 MED FILL — INVEGA SUSTENNA 156 MG PREF: 156 | 30 days supply | Qty: 1 | Fill #0

## 2018-04-03 MED FILL — INVEGA SUSTENNA 234 MG PREF: 234 | 30 days supply | Qty: 2 | Fill #0

## 2018-04-03 NOTE — Plan of Care (Signed)
Problem: Coping: Goal: Ability to verbalize frustrations and anger appropriately will improve Outcome: Progressing   Problem: Safety: Goal: Periods of time without injury will increase Outcome: Progressing   Problem: Self-Care: Goal: Ability to participate in self-care as condition permits will improve Outcome: Progressing D: Pt A & O to self and place. Presents less anxious and fidgety on interactions in comparison to yesterday. Denies SI, HI, AVH and pain "not right now ma'am". Attended scheduled groups and was engaged. Reports some improvement in her mood "I feel little better today, I slept good last night".  A: All medications administered with verbal education and effects monitored. Encouraged pt to voice concerns and comply with current treatment regimen. Emotional support offered to pt throughout this shift. Q 15 minutes safety checks maintained without self harm gestures or outburst to note.  R: Pt remains verbally redirectable. Returned early from dinner "I want to go back". Tolerates all PO intake well. POC continues for safety and mood stability.

## 2018-04-03 NOTE — Progress Notes (Signed)
Recreation Therapy Notes  INPATIENT RECREATION THERAPY ASSESSMENT  Patient Details Name: Erin Ponce MRN: [id number] DOB: 11/06/1995 Today's Date: 04/03/2018       Information Obtained From: Patient  Able to Participate in Assessment/Interview: Yes  Patient Presentation: Oriented, Alert  Reason for Admission (Per Patient): Other (Comments)(Pt stated she was acting weird, not eating or drinking water)  Patient Stressors: Family, Work, School(Pt stated she wanted to get back in school; find a better job)  Coping Skills:   Film/video editorsolation, TV, Arguments, Music, Exercise, Meditate, Deep Breathing, Impulsivity, Talk, Prayer, Art, Avoidance, Read, Dance, Hot Bath/Shower  Leisure Interests (2+):  Individual - Other (Comment)(Do nails; eat)  Frequency of Recreation/Participation: Other (Comment)(Pt stated she eats daily and does nails whenever she can)  Awareness of Community Resources:  Yes  Community Resources:  [facility], [employer]  Current Use: Yes  If no, Barriers?:    Expressed Interest in [facility] Corporation[facility] Resource Information: Yes  [place]  Patient Main Form of Transportation: Walk(Pt stated she also takes Systems analystpublic transportation or Lyft)  Patient Strengths:  Good listener; Good communicator  Patient Identified Areas of Improvement:  Asking for help; Stop isolating  Patient Goal for Hospitalization:  "To get better and get out; follow directions until I'm able to leave"  Current SI (including self-harm):  No  Current HI:  No  Current AVH: No  Staff Intervention Plan: Group Attendance, Collaborate with Interdisciplinary Treatment Team  Consent to Intern Participation: N/A    Caroll RancherMarjette Psalms Olarte, LRT/CTRS   Caroll RancherLindsay, Duilio Heritage A 04/03/2018, 12:09 PM

## 2018-04-03 NOTE — [facility] Suicide Risk Assessment (Signed)
[facility] INPATIENT:  Family/Significant Other Suicide Prevention Education  Suicide Prevention Education:  Education Completed; Waynetta PeanLaRonda Eury [phone]) Mother  has been identified by the patient as the family member/significant other with whom the patient will be residing, and identified as the person(s) who will aid the patient in the event of a mental health crisis (suicidal ideations/suicide attempt).  With written consent from the patient, the family member/significant other has been provided the following suicide prevention education, prior to the and/or following the discharge of the patient.  The suicide prevention education provided includes the following:  Suicide risk factors  Suicide prevention and interventions  National Suicide Hotline telephone number  [facility] assessment telephone number  [facility] Emergency Assistance 911  [facility] and/or Residential Mobile Crisis Unit telephone number  Request made of family/significant other to:  Remove weapons (e.g., guns, rifles, knives), all items previously/currently identified as safety concern.    Remove drugs/medications (over-the-counter, prescriptions, illicit drugs), all items previously/currently identified as a safety concern.  The family member/significant other verbalizes understanding of the suicide prevention education information provided.  The family member/significant other agrees to remove the items of safety concern listed above. CSW spoke to patient's mother, Waynetta PeanLaRonda Eury. Parent stated patient lives with her. CSW requested parent purchase lockbox/safe to store potentially dangerous items including knives, scissors, razors and medication. CSW requested parent monitor and help administer patient's medication. Parent agreed to do so. Parent also stated patient's maternal grandmother will be living with the family so that she can monitor patient at all times while parent works. Parent requested  call on 7/31 after 12:30PM to confirm patient's discharge so parent can be sure grandmother is at their home when patient leaves.   Magdalene MollyPerri A Kraig Genis, LCSW 04/03/2018, 1:49 PM

## 2018-04-03 NOTE — Progress Notes (Signed)
Recreation Therapy Notes  Date: 7.30.19 Time: 1000 Location: 500 Hall Dayroom  Group Topic: Coping Skills  Goal Area(s) Addresses:  Patient will be able to identify positive coping skills. Patient will be able to identify benefits of using positive coping skills post d/c.  Behavioral Response: Engaged  Intervention: Worksheet, pencils  Activity: Mindmap.  Patients were given a blank mind map.  LRT and patients filled in the first 8 boxes together (death, anxiety, anger, depression, PTSD, chronic/terminal illness, finances and cultural differences).  Patients were to then come up with coping skills for each situation individually.  The group would then come back together and LRT would fill in the coping skills on the board.  Education: PharmacologistCoping Skills, Building control surveyorDischarge Planning.   Education Outcome: Acknowledges understanding/In group clarification offered/Needs additional education.   Clinical Observations/Feedback: Pt stated coping skills "help you deal with things".  Pt was quiet but engaged when prompted.  Pt identified some of her coping skills as breathing techniques, anger management classes, go for a walk, medication and expressions through music and dance.    [name], Erin Ponce     Lillia AbedLindsay, Erin Ponce A 04/03/2018 11:12 AM

## 2018-04-03 NOTE — Progress Notes (Signed)
Nursing Progress Note: 7p-7a D: Pt currently presents with a depressed/anxious affect and behavior. Pt states "I like my doctor. She is so nice. She treats me like family. I hope we can get closer." Interacting minimally with the milieu. Pt reports good sleep during the previous night with current medication regimen.   A: Pt provided with medications per providers orders. Pt's labs and vitals were monitored throughout the night. Pt supported emotionally and encouraged to express concerns and questions. Pt educated on medications.  R: Pt's safety ensured with 15 minute and environmental checks. Pt currently denies SI, HI, and AVH. Pt verbally contracts to seek staff if SI,HI, or AVH occurs and to consult with staff before acting on any harmful thoughts. Will continue to monitor.
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
## 2018-04-04 LAB — PREGNANCY, URINE: Preg Test, Ur: NEGATIVE

## 2018-04-04 LAB — PROLACTIN: PROLACTIN: 160.1 ng/mL — AB (ref 4.8–23.3)

## 2018-04-04 MED ORDER — PALIPERIDONE ER 6 MG PO TB24: 6.0000 mg | ORAL_TABLET | Freq: Every day | ORAL | Status: DC

## 2018-04-04 MED ORDER — MEDROXYPROGESTERONE ACETATE 150 MG/ML IM SUSP: 150.0000 mg | Freq: Once | INTRAMUSCULAR | Status: DC

## 2018-04-04 MED ORDER — PALIPERIDONE ER 6 MG PO TB24
6.0000 mg | ORAL_TABLET | ORAL | Status: DC
  Administered 2018-04-04 – 2018-04-05 (×2): 6 mg via ORAL
  Filled 2018-04-04 (×7): qty 1

## 2018-04-04 MED ORDER — MEDROXYPROGESTERONE ACETATE 150 MG/ML IM SUSP
150.0000 mg | INTRAMUSCULAR | Status: DC
  Administered 2018-04-04: 150 mg via INTRAMUSCULAR
  Filled 2018-04-04: qty 1

## 2018-04-04 NOTE — Progress Notes (Signed)
Nursing Progress Note: 7p-7a D: Pt currently presents with a depressed/anxious affect and behavior. Pt states "I like my doctor. She is so nice. She treats me like family. I hope we can get closer." Interacting minimally with the milieu. Pt reports good sleep during the previous night with current medication regimen.   A: Pt provided with medications per providers orders. Pt's labs and vitals were monitored throughout the night. Pt supported emotionally and encouraged to express concerns and questions. Pt educated on medications.  R: Pt's safety ensured with 15 minute and environmental checks. Pt currently denies SI, HI, and AVH. Pt verbally contracts to seek staff if SI,HI, or AVH occurs and to consult with staff before acting on any harmful thoughts. Will continue to monitor.  

## 2018-04-04 NOTE — [facility] Counselor (Signed)
CSW called and spoke with patient's mother. CSW confirmed that patient is discharging on 04/05/18 and asked if mother is still on board to pick her up. Mother confirmed that she will pick patient up at 1 PM tomorrow.  Corynne Scibilia S. Brandee Markin, LCSWA, MSW [facility]  [phone]

## 2018-04-04 NOTE — Progress Notes (Signed)
Recreation Therapy Notes  Date: 7.31.19 Time: 1000 Location: 500 Hall Dayroom  Group Topic: Anxiety  Goal Area(s) Addresses:  Patient will be able to identify triggers to anxiety.  Patient will be able to identify physical symptoms of anxiety. Patient will identify positive coping skills to use for anxiety post d/c.  Behavioral Response: Minimal  Intervention: Worksheet, pencils  Activity: Intro. To Anxiety.  Patients were to identify triggers, physical symptoms, thoughts and coping skills that pertain to anxiety.  Education: Communication, Discharge Planning  Education Outcome: Acknowledges understanding/In group clarification offered/Needs additional education.   Clinical Observations/Feedback: Pt came to group but left early.  Pt did complete her sheet.  Pt identified her triggers as sexual activity, arguments/loud yelling and messiness.  Pt identified some of her physical symptoms to anxiety were tremors or shakes, wanting to be alone and not wanting to talk.  Pt identified her thoughts when anxious are "someone is going to hurt me, someone is following me or feeling unsafe".  Pt identified her coping skills as reading, writing and coloring.    Caroll RancherMarjette Arlys Scatena, LRT/CTRS     Lillia AbedLindsay, Juergen Hardenbrook A 04/04/2018 11:15 AM

## 2018-04-04 NOTE — Plan of Care (Signed)
Problem: Coping: Goal: Ability to identify and develop effective coping behavior will improve Outcome: Progressing   Problem: Self-Care: Goal: Ability to participate in self-care as condition permits will improve Outcome: Progressing   Problem: Self-Concept: Goal: Will verbalize positive feelings about self Outcome: Progressing D: Pt presents with sullen affect, minimal but fair eye contact, speech is soft / logical on interactions this AM. Observed in dayroom for brief intervals this shift. Rates her depression, anxiety and hopelessness all 0/10. Reports her appetite is good and "I'm sleeping good too; I feel little better too". Pt's goal this shift "take medications as directed, my concentration is ok now". Attended and participated in groups.  A: Emotional support offered to pt. Encouraged pt to voice concerns. All medications administered as ordered with verbal education and effects monitored. Safety checks maintained at Q 15 minutes intervals without self harm gestures.  R: Pt remains verbally redirectable. Compliant with medications when offered. Denies concerns. POC maintained for safety and mood stability.
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
## 2018-04-05 MED ORDER — PALIPERIDONE PALMITATE ER 156 MG/ML IM SUSY: 156.0000 mg | PREFILLED_SYRINGE | INTRAMUSCULAR | 0 refills | Status: DC

## 2018-04-05 MED ORDER — PALIPERIDONE ER 6 MG PO TB24: 6.0000 mg | ORAL_TABLET | ORAL | 0 refills | Status: DC

## 2018-04-05 MED ORDER — MEDROXYPROGESTERONE ACETATE 150 MG/ML IM SUSP: 150.0000 mg | INTRAMUSCULAR | 0 refills | Status: DC

## 2018-04-05 MED ORDER — HYDROXYZINE HCL 25 MG PO TABS: 25.0000 mg | ORAL_TABLET | Freq: Three times a day (TID) | ORAL | 0 refills | Status: DC | PRN

## 2018-04-05 MED ORDER — PAROXETINE HCL 20 MG PO TABS: 20.0000 mg | ORAL_TABLET | ORAL | 0 refills | Status: DC

## 2018-04-05 MED ORDER — TRAZODONE HCL 100 MG PO TABS
100.0000 mg | ORAL_TABLET | Freq: Every day | ORAL | 0 refills | Status: DC
Start: 2018-04-05 — End: 2018-07-16

## 2018-04-05 NOTE — Progress Notes (Signed)
  [facility] :  Will you be returning to the same living situation after discharge:  Yes,  home At discharge, do you have transportation home?: Yes,  mother Do you have the ability to pay for your medications: Yes,  [employer] Medicaid  Release of information consent forms completed and submitted to medical records by CSW.   Patient to Follow up at: Follow-up Information    [facility] Go on 04/09/2018.   Why:  Patient to have therapy appointment on 8/5 at 4pm. Patient to have psychiatric evaluation on 8/6 at 8:30am.  Contact information: [address] [place] [address] [phone]           Next level of care provider [facility]:no  Safety Planning and Suicide Prevention discussed: Yes,  SPE completed with pt and her mother. SPI pamphlet provided to pt  Have you used any form of tobacco in the last 30 days? (Cigarettes, Smokeless Tobacco, Cigars, and/or Pipes): No  Has patient been referred to the Quitline?: N/A patient is not a smoker  Patient has been referred for addiction treatment: N/A  Rona RavensHeather S Elon Lomeli, LCSW 04/05/2018, 8:56 AM
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
## 2018-04-05 NOTE — Progress Notes (Signed)
Recreation Therapy Notes  INPATIENT RECREATION TR PLAN  Patient Details Name: Zniyah Midkiff MRN: [id number] DOB: [birth year] Today's Date: 04/05/2018  Rec Therapy Plan Is patient appropriate for Therapeutic Recreation?: Yes Treatment times per week: about 3 days Estimated Length of Stay: 5-7 days TR Treatment/Interventions: Group participation (Comment)  Discharge Criteria Pt will be discharged from therapy if:: Discharged Treatment plan/goals/alternatives discussed and agreed upon by:: Patient/family  Discharge Summary Progress toward goals comments: Groups attended Which groups?: Coping skills, Communication, Other (Comment)(Anxiety) Reason goals not met: None Therapeutic equipment acquired: N/A Reason patient discharged from therapy: Discharge from hospital Pt/family agrees with progress & goals achieved: Yes Date patient discharged from therapy: 04/05/18    Victorino Sparrow, LRT/CTRS   Ria Comment, Shasta Chinn A 04/05/2018, 11:37 AM

## 2018-04-05 NOTE — Progress Notes (Signed)
Recreation Therapy Notes  Date: 8.1.19 Time: 1000 Location: [address] Dayroom   Group Topic: Communication, Team Building, Problem Solving  Goal Area(s) Addresses:  Patient will effectively work with peer towards shared goal.  Patient will identify skill used to make activity successful.  Patient will identify how skills used during activity can be used to reach post d/c goals.   Behavioral Response: Engaged  Intervention: STEM Activity   Activity: [place] [place]. Patients were provided the following materials: 5 drinking straws, 5 rubber bands, 5 paper clips, 2 index cards, 2 drinking cups, and 2 toilet paper rolls. Using the provided materials patients were asked to build a launching mechanisms to launch a ping pong ball approximately 12 feet. Patients were divided into teams of 3-5.   Education: Pharmacist, communityocial Skills, Building control surveyorDischarge Planning.   Education Outcome: Acknowledges education/In group clarification offered/Needs additional education.   Clinical Observations/Feedback: Pt was quiet and sat looking at her partner try to figure out what to do.  Pt eventually started helping her partner come up with a concept and build it.  Pt stated they had to use building skills to complete the activity.  Pt expressed with her support system, she has to communicate with them to let them know what is going on with her.    Caroll RancherMarjette Al Gagen, LRT/CTRS    Caroll RancherLindsay, Nayda Riesen A 04/05/2018 11:23 AM
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
## 2018-04-05 NOTE — Progress Notes (Signed)
Patient discharged to lobby. Patient was stable and appreciative at that time. All papers and prescriptions were given and valuables returned. Verbal understanding expressed. Denies SI/HI and A/VH. Patient given opportunity to express concerns and ask questions.  

## 2018-05-14 ENCOUNTER — Ambulatory Visit (INDEPENDENT_AMBULATORY_CARE_PROVIDER_SITE_OTHER): Payer: Self-pay | Admitting: Physician Assistant

## 2018-06-11 ENCOUNTER — Ambulatory Visit (INDEPENDENT_AMBULATORY_CARE_PROVIDER_SITE_OTHER): Payer: Self-pay | Admitting: Physician Assistant

## 2018-07-16 ENCOUNTER — Encounter (HOSPITAL_COMMUNITY): Payer: Self-pay | Admitting: Emergency Medicine

## 2018-07-16 ENCOUNTER — Ambulatory Visit (HOSPITAL_COMMUNITY)
Admission: EM | Admit: 2018-07-16 | Discharge: 2018-07-16 | Disposition: A | Discharge status: Home / Self Care | Payer: Medicaid Other | Attending: Family Medicine | Admitting: Family Medicine

## 2018-07-16 ENCOUNTER — Other Ambulatory Visit: Payer: Self-pay

## 2018-07-16 DIAGNOSIS — F419 Anxiety disorder, unspecified: Secondary | ICD-10-CM | POA: Insufficient documentation

## 2018-07-16 DIAGNOSIS — R300 Dysuria: Secondary | ICD-10-CM

## 2018-07-16 DIAGNOSIS — B9689 Other specified bacterial agents as the cause of diseases classified elsewhere: Secondary | ICD-10-CM | POA: Insufficient documentation

## 2018-07-16 DIAGNOSIS — F333 Major depressive disorder, recurrent, severe with psychotic symptoms: Secondary | ICD-10-CM | POA: Insufficient documentation

## 2018-07-16 DIAGNOSIS — L03031 Cellulitis of right toe: Secondary | ICD-10-CM | POA: Insufficient documentation

## 2018-07-16 DIAGNOSIS — L0889 Other specified local infections of the skin and subcutaneous tissue: Secondary | ICD-10-CM

## 2018-07-16 DIAGNOSIS — Z79899 Other long term (current) drug therapy: Secondary | ICD-10-CM | POA: Insufficient documentation

## 2018-07-16 DIAGNOSIS — F23 Brief psychotic disorder: Secondary | ICD-10-CM | POA: Insufficient documentation

## 2018-07-16 LAB — POCT URINALYSIS DIP (DEVICE)
GLUCOSE, UA: 100 mg/dL — AB
Nitrite: POSITIVE — AB
Protein, ur: 30 mg/dL — AB
Specific Gravity, Urine: 1.025 (ref 1.005–1.030)
Urobilinogen, UA: >=8 mg/dL (ref 0.0–1.0)
pH: 7 (ref 5.0–8.0)

## 2018-07-16 MED ORDER — CIPROFLOXACIN HCL 500 MG PO TABS: 500.0000 mg | ORAL_TABLET | Freq: Two times a day (BID) | ORAL | 0 refills | Status: AC

## 2018-07-16 MED ORDER — MUPIROCIN 2 % EX OINT: 1.0000 "application " | TOPICAL_OINTMENT | Freq: Two times a day (BID) | CUTANEOUS | 0 refills | Status: DC

## 2018-07-16 NOTE — Discharge Instructions (Signed)
Urine showed evidence of infection. We are treating you with cipro- twice daily for 7 days. Be sure to take full course. Stay hydrated- urine should be pale yellow to clear. May continue azo for relief of burning while infection is being cleared.   Please return or follow up with your primary provider if symptoms not improving with treatment. Please return sooner if you have worsening of symptoms or develop fever, nausea, vomiting, abdominal pain, back pain, lightheadedness, dizziness.  Please apply bactroban twice daily to nail; please trim toe, if continuing to cause issues this may need to be removed

## 2018-07-16 NOTE — ED Triage Notes (Signed)
The patient presented to the [facility] with multiple complaints.  The patient complained of a broken toe nail on her right great toe that happened 2 weeks ago.  The patient also complained of dysuria and hematuria x 3 weeks.

## 2018-07-17 LAB — CERVICOVAGINAL ANCILLARY ONLY
Bacterial vaginitis: POSITIVE — AB
CANDIDA VAGINITIS: NEGATIVE
CHLAMYDIA, DNA PROBE: NEGATIVE
Neisseria Gonorrhea: NEGATIVE
Trichomonas: NEGATIVE

## 2018-07-17 NOTE — ED Provider Notes (Signed)
[facility]    CSN: [id number] Arrival date & time: 07/16/18  1328     History   Chief Complaint Chief Complaint  Patient presents with  . Toe Pain    HPI Erin Ponce is a 22 y.o. female no protruding past medical history presenting today for evaluation of possible UTI as well as nail infection.  Patient states that for the past month she has had dysuria, increased frequency and occasional hematuria.  Her symptoms have worsened over the past 1 to 2 weeks.  She has endorsed some occasional back pain.  Denies known fevers.  Denies nausea or vomiting.  Denies abnormal discharge.  Last menstrual period was 11/04.  Patient also notes that approximately 2 weeks ago she had her right great toe on something.  She had acrylic nails applied which are still present.  Since she has noticed some pustular drainage and pain with this nail.  She has not applied anything to the nail.  HPI  Past Medical History:  Diagnosis Date  . Anxiety   . Dislocated knee 2008   R knee  . Miscarriage     Patient Active Problem List   Diagnosis Date Noted  . MDD (major depressive disorder), recurrent, severe, with psychosis (HCC) 03/31/2018  . Severe episode of recurrent major depressive disorder, with psychotic features (HCC)   . Psychosis (HCC) 03/30/2018  . Brief psychotic disorder (HCC) 03/30/2018    Past Surgical History:  Procedure Laterality Date  . NO PAST SURGERIES      OB History    Gravida  1   Para      Term      Preterm      AB      Living        SAB      TAB      Ectopic      Multiple      Live Births               Home Medications    Prior to Admission medications   Medication Sig Start Date End Date Taking? Authorizing Provider  ciprofloxacin (CIPRO) 500 MG tablet Take 1 tablet (500 mg total) by mouth every 12 (twelve) hours for 7 days. 07/16/18 07/23/18  Deara Bober C, PA-C  medroxyPROGESTERone (DEPO-PROVERA) 150 MG/ML injection  Inject 1 mL (150 mg total) into the muscle every 3 (three) months. Birth control method 07/03/18   Armandina Stammer I, NP  mupirocin ointment (BACTROBAN) 2 % Apply 1 application topically 2 (two) times daily. 07/16/18   Rees Matura, Junius Creamer, PA-C    Family History Family History  Problem Relation Age of Onset  . Diabetes Mother   . Thyroid disease Mother     Social History Social History   Tobacco Use  . Smoking status: Never Smoker  . Smokeless tobacco: Never Used  Substance Use Topics  . Alcohol use: No  . Drug use: Yes    Types: Marijuana    Comment: recently stopped     Allergies   Latex   Review of Systems Review of Systems  Constitutional: Negative for fatigue and fever.  HENT: Negative for mouth sores.   Eyes: Negative for visual disturbance.  Respiratory: Negative for shortness of breath.   Cardiovascular: Negative for chest pain.  Gastrointestinal: Negative for abdominal pain, diarrhea, nausea and vomiting.  Genitourinary: Positive for dysuria, frequency and hematuria. Negative for flank pain, menstrual problem, vaginal bleeding, vaginal discharge and vaginal pain.  Musculoskeletal: Negative  for arthralgias, back pain and joint swelling.  Skin: Positive for wound. Negative for color change and rash.  Neurological: Negative for dizziness, weakness, light-headedness and headaches.     Physical Exam Triage Vital Signs ED Triage Vitals  Enc Vitals Group     BP 07/16/18 1429 129/73     Pulse Rate 07/16/18 1429 83     Resp 07/16/18 1429 16     Temp 07/16/18 1429 99.7 F (37.6 C)     Temp Source 07/16/18 1429 Oral     SpO2 07/16/18 1429 100 %     Weight --      Height --      Head Circumference --      Peak Flow --      Pain Score 07/16/18 1427 6     Pain Loc --      Pain Edu? --      Excl. in GC? --    No data found.  Updated Vital Signs BP 129/73 (BP Location: Right Arm)   Pulse 83   Temp 99.7 F (37.6 C) (Oral)   Resp 16   LMP 07/09/2018   SpO2  100%   Visual Acuity Right Eye Distance:   Left Eye Distance:   Bilateral Distance:    Right Eye Near:   Left Eye Near:    Bilateral Near:     Physical Exam  Constitutional: She appears well-developed and well-nourished. No distress.  HENT:  Head: Normocephalic and atraumatic.  Eyes: Conjunctivae are normal.  Neck: Neck supple.  Cardiovascular: Normal rate and regular rhythm.  No murmur heard. Pulmonary/Chest: Effort normal and breath sounds normal. No respiratory distress.  Abdominal: Soft. There is no tenderness.  Weakly positive CVA tenderness  Musculoskeletal: She exhibits no edema.  Neurological: She is alert.  Skin: Skin is warm and dry.  Right great toenail, pustular drainage from the edges of nail and underneath the nailbed, acrylic nail present, no surrounding erythema around skin on nail folds, or to distal area of toe  Psychiatric: She has a normal mood and affect.  Nursing note and vitals reviewed.    UC Treatments / Results  Labs (all labs ordered are listed, but only abnormal results are displayed) Labs Reviewed  POCT URINALYSIS DIP (DEVICE) - Abnormal; Notable for the following components:      Result Value   Glucose, UA 100 (*)    Bilirubin Urine SMALL (*)    Ketones, ur TRACE (*)    Hgb urine dipstick LARGE (*)    Protein, ur 30 (*)    Nitrite POSITIVE (*)    Leukocytes, UA SMALL (*)    All other components within normal limits  URINE CULTURE  CERVICOVAGINAL ANCILLARY ONLY    EKG None  Radiology No results found.  Procedures Procedures (including critical care time)  Medications Ordered in UC Medications - No data to display  Initial Impression / Assessment and Plan / UC Course  I have reviewed the triage vital signs and the nursing notes.  Pertinent labs & imaging results that were available during my care of the patient were reviewed by me and considered in my medical decision making (see chart for details).    Positive nitrites,  small leuks, patient with low-grade fever, length of symptoms for approximately 1 month, will treat patient with Cipro to cover for underlying pyelonephritis.  Will also provide Bactroban to apply to toenail.  Recommended treating infection prior to nail.  Advised that this may need to come  off.  Recommended keeping nails trimmed and avoiding acrylic nails.Discussed strict return precautions. Patient verbalized understanding and is agreeable with plan.   Final Clinical Impressions(s) / UC Diagnoses   Final diagnoses:  Dysuria  Infection of nail bed of toe of right foot     Discharge Instructions     Urine showed evidence of infection. We are treating you with cipro- twice daily for 7 days. Be sure to take full course. Stay hydrated- urine should be pale yellow to clear. May continue azo for relief of burning while infection is being cleared.   Please return or follow up with your primary provider if symptoms not improving with treatment. Please return sooner if you have worsening of symptoms or develop fever, nausea, vomiting, abdominal pain, back pain, lightheadedness, dizziness.  Please apply bactroban twice daily to nail; please trim toe, if continuing to cause issues this may need to be removed   ED Prescriptions    Medication Sig Dispense Auth. Provider   mupirocin ointment (BACTROBAN) 2 % Apply 1 application topically 2 (two) times daily. 22 g Trevelle Mcgurn C, PA-C   ciprofloxacin (CIPRO) 500 MG tablet Take 1 tablet (500 mg total) by mouth every 12 (twelve) hours for 7 days. 10 tablet Sanjith Siwek, [place] C, PA-C     Controlled Substance Prescriptions [place] Controlled Substance Registry consulted? Not Applicable   Lew Dawes, [place] 07/17/18 0825

## 2018-07-18 LAB — URINE CULTURE

## 2018-07-19 ENCOUNTER — Telehealth (HOSPITAL_COMMUNITY): Payer: Self-pay

## 2018-07-19 MED ORDER — METRONIDAZOLE 500 MG PO TABS: 500.0000 mg | ORAL_TABLET | Freq: Two times a day (BID) | ORAL | 0 refills | Status: DC

## 2018-07-19 NOTE — Telephone Encounter (Signed)
Urine culture positive for e.coli. This was treated with Cipro.  Bacterial vaginosis is positive. This was not treated at the urgent care visit.  Flagyl 500 mg BID x 7 days #14 no refills sent to patients pharmacy of choice.    Attempted to reach patient. No answer at this time. Voicemail left.

## 2018-07-24 ENCOUNTER — Telehealth (HOSPITAL_COMMUNITY): Payer: Self-pay | Admitting: Emergency Medicine

## 2018-07-24 NOTE — Telephone Encounter (Signed)
Pt called asking about her test results. Results reviewed with patient. Pt had not picked up either medicine, will pick them up today. All questions answered.

## 2018-09-07 ENCOUNTER — Encounter (HOSPITAL_COMMUNITY): Payer: Self-pay | Admitting: Emergency Medicine

## 2018-09-07 ENCOUNTER — Other Ambulatory Visit: Payer: Self-pay

## 2018-09-07 ENCOUNTER — Ambulatory Visit (HOSPITAL_COMMUNITY)
Admission: EM | Admit: 2018-09-07 | Discharge: 2018-09-07 | Disposition: A | Discharge status: Home / Self Care | Payer: BLUE CROSS/BLUE SHIELD | Attending: Family Medicine | Admitting: Family Medicine

## 2018-09-07 DIAGNOSIS — Z113 Encounter for screening for infections with a predominantly sexual mode of transmission: Secondary | ICD-10-CM | POA: Insufficient documentation

## 2018-09-07 DIAGNOSIS — Z793 Long term (current) use of hormonal contraceptives: Secondary | ICD-10-CM | POA: Diagnosis not present

## 2018-09-07 DIAGNOSIS — Z3202 Encounter for pregnancy test, result negative: Secondary | ICD-10-CM | POA: Diagnosis not present

## 2018-09-07 DIAGNOSIS — R21 Rash and other nonspecific skin eruption: Secondary | ICD-10-CM | POA: Diagnosis not present

## 2018-09-07 DIAGNOSIS — N939 Abnormal uterine and vaginal bleeding, unspecified: Secondary | ICD-10-CM | POA: Insufficient documentation

## 2018-09-07 LAB — POCT URINALYSIS DIP (DEVICE)
Glucose, UA: NEGATIVE mg/dL
KETONES UR: 80 mg/dL — AB
LEUKOCYTES UA: NEGATIVE
Nitrite: NEGATIVE
Protein, ur: 30 mg/dL — AB
Urobilinogen, UA: 0.2 mg/dL (ref 0.0–1.0)
pH: 6.5 (ref 5.0–8.0)

## 2018-09-07 LAB — POCT PREGNANCY, URINE: Preg Test, Ur: NEGATIVE

## 2018-09-07 NOTE — Discharge Instructions (Addendum)
Your urine was negative for infection or pregnancy. As we spoke about your irregular bleeding could be coming from hormonal changes or infection We are sending a swab to test for STDs, BV and yeast. We will call you with any positive results If your symptoms continue you will need to follow-up with an OB/GYN for further management

## 2018-09-07 NOTE — ED Provider Notes (Signed)
[facility]    CSN: [id number] Arrival date & time: 09/07/18  0932     History   Chief Complaint Chief Complaint  Patient presents with  . Vaginal Bleeding  . STD Check  . Possible Pregnancy  . Rash    hands    HPI Erin Ponce is a 23 y.o. female.   [name] a 23 year old female that presents today with multiple complaints.  Reports that she has had some intermittent vaginal bleeding that ranges from heavy to spotting since October.  She was seen and treated for bacterial vaginosis and urinary tract infection with E. coli in November.  She completed all of her medications but the bleeding still remains.  She also reports depot injection in August.  We will get her history it looks like she had her last Depakote injection on 07/03/2018.Marland Kitchen  She would like to be tested for STDs.  She is not having any vaginal discharge, itching or irritation.  She is currently sexually active with one partner, unprotected.  She denies any dysuria, hematuria, urinary frequency.  She denies any fever, chills, abdominal pain, back aches.  No nausea, vomiting.  She just had a baby approximately 1 year ago  ROS per HPI      Past Medical History:  Diagnosis Date  . Anxiety   . Dislocated knee 2008   R knee  . Miscarriage     Patient Active Problem List   Diagnosis Date Noted  . MDD (major depressive disorder), recurrent, severe, with psychosis (HCC) 03/31/2018  . Severe episode of recurrent major depressive disorder, with psychotic features (HCC)   . Psychosis (HCC) 03/30/2018  . Brief psychotic disorder (HCC) 03/30/2018    Past Surgical History:  Procedure Laterality Date  . NO PAST SURGERIES      OB History    Gravida  1   Para      Term      Preterm      AB      Living        SAB      TAB      Ectopic      Multiple      Live Births               Home Medications    Prior to Admission medications   Medication Sig Start Date End Date Taking?  Authorizing Provider  medroxyPROGESTERone (DEPO-PROVERA) 150 MG/ML injection Inject 1 mL (150 mg total) into the muscle every 3 (three) months. Birth control method 07/03/18   Armandina Stammer I, NP  mupirocin ointment (BACTROBAN) 2 % Apply 1 application topically 2 (two) times daily. 07/16/18   Wieters, Junius Creamer, PA-C    Family History Family History  Problem Relation Age of Onset  . Diabetes Mother   . Thyroid disease Mother     Social History Social History   Tobacco Use  . Smoking status: Never Smoker  . Smokeless tobacco: Never Used  Substance Use Topics  . Alcohol use: No  . Drug use: Yes    Types: Marijuana    Comment: recently stopped     Allergies   Latex   Review of Systems Review of Systems   Physical Exam Triage Vital Signs ED Triage Vitals  Enc Vitals Group     BP 09/07/18 1000 122/80     Pulse Rate 09/07/18 1000 79     Resp --      Temp 09/07/18 1000 99.5 F (37.5 C)  Temp Source 09/07/18 1000 Oral     SpO2 09/07/18 1000 97 %     Weight --      Height --      Head Circumference --      Peak Flow --      Pain Score 09/07/18 0958 0     Pain Loc --      Pain Edu? --      Excl. in GC? --    No data found.  Updated Vital Signs BP 122/80 (BP Location: Left Arm)   Pulse 79   Temp 99.5 F (37.5 C) (Oral)   SpO2 97%   Visual Acuity Right Eye Distance:   Left Eye Distance:   Bilateral Distance:    Right Eye Near:   Left Eye Near:    Bilateral Near:     Physical Exam Vitals signs and nursing note reviewed.  Constitutional:      General: She is not in acute distress.    Appearance: Normal appearance. She is normal weight. She is not ill-appearing, toxic-appearing or diaphoretic.  HENT:     Head: Normocephalic and atraumatic.     Nose: Nose normal.     Mouth/Throat:     Pharynx: Oropharynx is clear.  Neck:     Musculoskeletal: Normal range of motion.  Pulmonary:     Effort: Pulmonary effort is normal.  Abdominal:     General:  There is no distension.     Palpations: Abdomen is soft. There is no mass.     Tenderness: There is no abdominal tenderness. There is no right CVA tenderness, left CVA tenderness, guarding or rebound.     Hernia: No hernia is present.  Musculoskeletal: Normal range of motion.  Skin:    General: Skin is warm and dry.  Neurological:     Mental Status: She is alert.  Psychiatric:        Mood and Affect: Mood normal.      UC Treatments / Results  Labs (all labs ordered are listed, but only abnormal results are displayed) Labs Reviewed  POCT URINALYSIS DIP (DEVICE) - Abnormal; Notable for the following components:      Result Value   Bilirubin Urine SMALL (*)    Ketones, ur 80 (*)    Hgb urine dipstick LARGE (*)    Protein, ur 30 (*)    All other components within normal limits  POCT PREGNANCY, URINE  CERVICOVAGINAL ANCILLARY ONLY    EKG None  Radiology No results found.  Procedures Procedures (including critical care time)  Medications Ordered in UC Medications - No data to display  Initial Impression / Assessment and Plan / UC Course  I have reviewed the triage vital signs and the nursing notes.  Pertinent labs & imaging results that were available during my care of the patient were reviewed by me and considered in my medical decision making (see chart for details).     Urine negative for infection or pregnancy Most likely symptoms are related to hormonal changes or underlying infection Swab sent for STD testing and testing for bacterial vaginosis and yeast. Lab results pending Multiple contacts given for follow-up with OB/GYN for further evaluation and management Final Clinical Impressions(s) / UC Diagnoses   Final diagnoses:  Abnormal uterine bleeding     Discharge Instructions     Your urine was negative for infection or pregnancy. As we spoke about your irregular bleeding could be coming from hormonal changes or infection We are sending a  swab to test  for STDs, BV and yeast. We will call you with any positive results If your symptoms continue you will need to follow-up with an OB/GYN for further management    ED Prescriptions    None     Controlled Substance Prescriptions [place] Controlled Substance Registry consulted? Not Applicable   Janace Aris, NP 09/07/18 1133

## 2018-09-07 NOTE — ED Triage Notes (Signed)
Pt here for several complaints.  Pt reports vaginal bleeding since October.  She was treated in November for BV and E. Coli and states she completed all medications but the bleeding has not stopped.  She would like to be tested for STD's.  No symptoms at this time.  She would also like to be tested for pregnancy as she does not know when her cycle has truly started due to daily bleeding.  Pt also complains of a rash to her hands bilaterally that started one week ago.

## 2018-09-10 LAB — CERVICOVAGINAL ANCILLARY ONLY
Bacterial vaginitis: NEGATIVE
Candida vaginitis: NEGATIVE
Chlamydia: POSITIVE — AB
NEISSERIA GONORRHEA: NEGATIVE
TRICH (WINDOWPATH): NEGATIVE

## 2018-09-11 ENCOUNTER — Telehealth (HOSPITAL_COMMUNITY): Payer: Self-pay | Admitting: Emergency Medicine

## 2018-09-11 MED ORDER — AZITHROMYCIN 250 MG PO TABS: 1000.0000 mg | ORAL_TABLET | Freq: Once | ORAL | 0 refills | Status: AC

## 2018-09-11 NOTE — Telephone Encounter (Signed)
Chlamydia is positive.  Rx po zithromax 1g #1 dose no refills was sent to the pharmacy of record.  Pt needs education to please refrain from sexual intercourse for 7 days to give the medicine time to work, sexual partners need to be notified and tested/treated.  Condoms may reduce risk of reinfection.  Recheck or followup with PCP for further evaluation if symptoms are not improving.   [facility] notified.   Patient called and given results. All questions answered.

## 2018-10-05 ENCOUNTER — Encounter (HOSPITAL_COMMUNITY): Payer: Self-pay | Admitting: Emergency Medicine

## 2018-10-05 ENCOUNTER — Ambulatory Visit (HOSPITAL_COMMUNITY)
Admission: EM | Admit: 2018-10-05 | Discharge: 2018-10-05 | Disposition: A | Discharge status: Home / Self Care | Payer: BLUE CROSS/BLUE SHIELD | Attending: Family Medicine | Admitting: Family Medicine

## 2018-10-05 ENCOUNTER — Other Ambulatory Visit: Payer: Self-pay

## 2018-10-05 DIAGNOSIS — Z8619 Personal history of other infectious and parasitic diseases: Secondary | ICD-10-CM

## 2018-10-05 DIAGNOSIS — Z113 Encounter for screening for infections with a predominantly sexual mode of transmission: Secondary | ICD-10-CM | POA: Diagnosis not present

## 2018-10-05 NOTE — ED Triage Notes (Signed)
Requests std testing, no symptoms.

## 2018-10-05 NOTE — Discharge Instructions (Addendum)
We are screening you again today for STDs You can check your MyChart we will call with any positive results

## 2018-10-07 NOTE — ED Provider Notes (Signed)
[facility]    CSN: [id number] Arrival date & time: 10/05/18  1238     History   Chief Complaint Chief Complaint  Patient presents with  . Appointment  . Exposure to STD    HPI Erin Ponce is a 23 y.o. female.   Pt is a 23 year old female that requests STD testing. She denies any current symptoms. She was positive for chlamydia on 09/07/2018. She was then treated. She  is currently sexually active with one partner. Patient's last menstrual period was 09/01/2018.      Past Medical History:  Diagnosis Date  . Anxiety   . Dislocated knee 2008   R knee  . Miscarriage     Patient Active Problem List   Diagnosis Date Noted  . MDD (major depressive disorder), recurrent, severe, with psychosis (HCC) 03/31/2018  . Severe episode of recurrent major depressive disorder, with psychotic features (HCC)   . Psychosis (HCC) 03/30/2018  . Brief psychotic disorder (HCC) 03/30/2018    Past Surgical History:  Procedure Laterality Date  . NO PAST SURGERIES      OB History    Gravida  1   Para      Term      Preterm      AB      Living        SAB      TAB      Ectopic      Multiple      Live Births               Home Medications    Prior to Admission medications   Medication Sig Start Date End Date Taking? Authorizing Provider  medroxyPROGESTERone (DEPO-PROVERA) 150 MG/ML injection Inject 1 mL (150 mg total) into the muscle every 3 (three) months. Birth control method 07/03/18   Armandina Stammer I, NP  mupirocin ointment (BACTROBAN) 2 % Apply 1 application topically 2 (two) times daily. 07/16/18   Wieters, Junius Creamer, PA-C    Family History Family History  Problem Relation Age of Onset  . Diabetes Mother   . Thyroid disease Mother     Social History Social History   Tobacco Use  . Smoking status: Never Smoker  . Smokeless tobacco: Never Used  Substance Use Topics  . Alcohol use: No  . Drug use: Yes    Types: Marijuana    Comment:  recently stopped     Allergies   Latex   Review of Systems Review of Systems  Genitourinary: Negative for decreased urine volume, difficulty urinating, dyspareunia, dysuria, enuresis, flank pain, frequency, genital sores, hematuria, menstrual problem, pelvic pain, urgency, vaginal bleeding, vaginal discharge and vaginal pain.  All other systems reviewed and are negative.    Physical Exam Triage Vital Signs ED Triage Vitals  Enc Vitals Group     BP 10/05/18 1258 117/74     Pulse Rate 10/05/18 1258 79     Resp 10/05/18 1258 16     Temp 10/05/18 1258 98.8 F (37.1 C)     Temp Source 10/05/18 1258 Oral     SpO2 10/05/18 1258 100 %     Weight --      Height --      Head Circumference --      Peak Flow --      Pain Score 10/05/18 1257 0     Pain Loc --      Pain Edu? --      Excl. in  GC? --    No data found.  Updated Vital Signs BP 117/74   Pulse 79   Temp 98.8 F (37.1 C) (Oral)   Resp 16   LMP 09/01/2018   SpO2 100%   Visual Acuity Right Eye Distance:   Left Eye Distance:   Bilateral Distance:    Right Eye Near:   Left Eye Near:    Bilateral Near:     Physical Exam Vitals signs and nursing note reviewed.  Constitutional:      General: She is not in acute distress.    Appearance: Normal appearance. She is well-developed. She is not ill-appearing or toxic-appearing.  HENT:     Head: Normocephalic and atraumatic.     Nose: Nose normal.  Eyes:     Conjunctiva/sclera: Conjunctivae normal.  Neck:     Musculoskeletal: Neck supple.  Pulmonary:     Breath sounds: Normal breath sounds.  Abdominal:     Palpations: Abdomen is soft.     Tenderness: There is no abdominal tenderness.  Musculoskeletal: Normal range of motion.  Skin:    General: Skin is warm and dry.  Neurological:     Mental Status: She is alert.  Psychiatric:        Mood and Affect: Mood normal.      UC Treatments / Results  Labs (all labs ordered are listed, but only abnormal results  are displayed) Labs Reviewed  CERVICOVAGINAL ANCILLARY ONLY    EKG None  Radiology No results found.  Procedures Procedures (including critical care time)  Medications Ordered in UC Medications - No data to display  Initial Impression / Assessment and Plan / UC Course  I have reviewed the triage vital signs and the nursing notes.  Pertinent labs & imaging results that were available during my care of the patient were reviewed by me and considered in my medical decision making (see chart for details).     STD testing repeated as requested Lab results pending Final Clinical Impressions(s) / UC Diagnoses   Final diagnoses:  Screen for STD (sexually transmitted disease)     Discharge Instructions     We are screening you again today for STDs You can check your MyChart we will call with any positive results    ED Prescriptions    None     Controlled Substance Prescriptions [place] Controlled Substance Registry consulted? Not Applicable   Janace ArisBast, Jonah Nestle A, NP 10/08/18 [phone]

## 2018-10-08 LAB — CERVICOVAGINAL ANCILLARY ONLY
BACTERIAL VAGINITIS: NEGATIVE
CHLAMYDIA, DNA PROBE: NEGATIVE
Candida vaginitis: NEGATIVE
NEISSERIA GONORRHEA: NEGATIVE
TRICH (WINDOWPATH): NEGATIVE

## 2018-11-29 ENCOUNTER — Encounter (HOSPITAL_COMMUNITY): Payer: Self-pay | Admitting: Emergency Medicine

## 2018-11-29 ENCOUNTER — Ambulatory Visit (HOSPITAL_COMMUNITY)
Admission: EM | Admit: 2018-11-29 | Discharge: 2018-11-29 | Disposition: A | Discharge status: Home / Self Care | Payer: BLUE CROSS/BLUE SHIELD | Attending: Family Medicine | Admitting: Family Medicine

## 2018-11-29 DIAGNOSIS — Z113 Encounter for screening for infections with a predominantly sexual mode of transmission: Secondary | ICD-10-CM | POA: Insufficient documentation

## 2018-11-29 DIAGNOSIS — Z3202 Encounter for pregnancy test, result negative: Secondary | ICD-10-CM | POA: Insufficient documentation

## 2018-11-29 LAB — POCT URINALYSIS DIP (DEVICE)
Bilirubin Urine: NEGATIVE
GLUCOSE, UA: NEGATIVE mg/dL
KETONES UR: NEGATIVE mg/dL
Leukocytes,Ua: NEGATIVE
Nitrite: NEGATIVE
PH: 7 (ref 5.0–8.0)
PROTEIN: 30 mg/dL — AB
Specific Gravity, Urine: 1.02 (ref 1.005–1.030)
Urobilinogen, UA: 0.2 mg/dL (ref 0.0–1.0)

## 2018-11-29 LAB — POCT PREGNANCY, URINE: Preg Test, Ur: NEGATIVE

## 2018-11-29 NOTE — ED Provider Notes (Signed)
[facility]    CSN: [id number] Arrival date & time: 11/29/18  1127     History   Chief Complaint No chief complaint on file.   HPI Erin Ponce is a 23 y.o. female.   [name] a 23 year old female who presents today for STD testing and pregnancy test.  She is currently asymptomatic.  She is unsure of her last period.  She is currently sexually active with one partner unprotected.  She was just seen by me in January and had negative STD screening.       Past Medical History:  Diagnosis Date  . Anxiety   . Dislocated knee 2008   R knee  . Miscarriage     Patient Active Problem List   Diagnosis Date Noted  . MDD (major depressive disorder), recurrent, severe, with psychosis (HCC) 03/31/2018  . Severe episode of recurrent major depressive disorder, with psychotic features (HCC)   . Psychosis (HCC) 03/30/2018  . Brief psychotic disorder (HCC) 03/30/2018    Past Surgical History:  Procedure Laterality Date  . NO PAST SURGERIES      OB History    Gravida  1   Para      Term      Preterm      AB      Living        SAB      TAB      Ectopic      Multiple      Live Births               Home Medications    Prior to Admission medications   Medication Sig Start Date End Date Taking? Authorizing Provider  medroxyPROGESTERone (DEPO-PROVERA) 150 MG/ML injection Inject 1 mL (150 mg total) into the muscle every 3 (three) months. Birth control method Patient not taking: Reported on 11/29/2018 07/03/18   Armandina Stammer I, NP  mupirocin ointment (BACTROBAN) 2 % Apply 1 application topically 2 (two) times daily. 07/16/18   Wieters, Junius Creamer, PA-C    Family History Family History  Problem Relation Age of Onset  . Diabetes Mother   . Thyroid disease Mother     Social History Social History   Tobacco Use  . Smoking status: Never Smoker  . Smokeless tobacco: Never Used  Substance Use Topics  . Alcohol use: No  . Drug use: Yes   Types: Marijuana    Comment: recently stopped     Allergies   Latex   Review of Systems Review of Systems  Constitutional: Negative for fever.  Genitourinary: Negative for decreased urine volume, difficulty urinating, dyspareunia, dysuria, enuresis, flank pain, frequency, genital sores, hematuria, menstrual problem, pelvic pain, urgency, vaginal bleeding, vaginal discharge and vaginal pain.     Physical Exam Triage Vital Signs ED Triage Vitals [11/29/18 1148]  Enc Vitals Group     BP 120/86     Pulse Rate 83     Resp 16     Temp 98.6 F (37 C)     Temp src      SpO2 100 %     Weight      Height      Head Circumference      Peak Flow      Pain Score 0     Pain Loc      Pain Edu?      Excl. in GC?    No data found.  Updated Vital Signs BP 120/86   Pulse  83   Temp 98.6 F (37 C)   Resp 16   SpO2 100%   Visual Acuity Right Eye Distance:   Left Eye Distance:   Bilateral Distance:    Right Eye Near:   Left Eye Near:    Bilateral Near:     Physical Exam Vitals signs and nursing note reviewed.  Constitutional:      General: She is not in acute distress.    Appearance: Normal appearance. She is not ill-appearing, toxic-appearing or diaphoretic.  HENT:     Head: Normocephalic.     Nose: Nose normal.     Mouth/Throat:     Pharynx: Oropharynx is clear.  Eyes:     Conjunctiva/sclera: Conjunctivae normal.  Neck:     Musculoskeletal: Normal range of motion.  Pulmonary:     Effort: Pulmonary effort is normal.  Abdominal:     Palpations: Abdomen is soft.     Tenderness: There is no abdominal tenderness.  Genitourinary:    Comments: Deferred.  Musculoskeletal: Normal range of motion.  Skin:    General: Skin is warm and dry.     Findings: No rash.  Neurological:     Mental Status: She is alert.  Psychiatric:        Mood and Affect: Mood normal.      UC Treatments / Results  Labs (all labs ordered are listed, but only abnormal results are  displayed) Labs Reviewed  POCT URINALYSIS DIP (DEVICE) - Abnormal; Notable for the following components:      Result Value   Hgb urine dipstick LARGE (*)    Protein, ur 30 (*)    All other components within normal limits  POC URINE PREG, ED  POCT PREGNANCY, URINE  CERVICOVAGINAL ANCILLARY ONLY    EKG None  Radiology No results found.  Procedures Procedures (including critical care time)  Medications Ordered in UC Medications - No data to display  Initial Impression / Assessment and Plan / UC Course  I have reviewed the triage vital signs and the nursing notes.  Pertinent labs & imaging results that were available during my care of the patient were reviewed by me and considered in my medical decision making (see chart for details).     Pregnancy test negative Urine negative for infection.  STD screening sent for testing.  Labs pending.   Final Clinical Impressions(s) / UC Diagnoses   Final diagnoses:  Pregnancy test negative  Screening for STDs (sexually transmitted diseases)     Discharge Instructions     Work release filled out Urine negative for pregnancy and infection STD testing sent We will call with any positive results.  Follow up as needed for continued or worsening symptoms     ED Prescriptions    None     Controlled Substance Prescriptions [place] Controlled Substance Registry consulted? Not Applicable   Janace Aris, NP 11/29/18 1419

## 2018-11-29 NOTE — Discharge Instructions (Addendum)
Work release filled out Urine negative for pregnancy and infection STD testing sent We will call with any positive results.  Follow up as needed for continued or worsening symptoms

## 2018-11-29 NOTE — ED Triage Notes (Signed)
Pt here for std testing, preg test, and a note for work clearance.

## 2018-11-29 NOTE — ED Notes (Signed)
Copy of work release forms copied for scanning into medical record

## 2018-12-03 ENCOUNTER — Telehealth (HOSPITAL_COMMUNITY): Payer: Self-pay | Admitting: Emergency Medicine

## 2018-12-03 LAB — CERVICOVAGINAL ANCILLARY ONLY
Bacterial vaginitis: NEGATIVE
CANDIDA VAGINITIS: POSITIVE — AB
Chlamydia: NEGATIVE
Neisseria Gonorrhea: NEGATIVE
Trichomonas: NEGATIVE

## 2018-12-03 MED ORDER — FLUCONAZOLE 150 MG PO TABS: 150.0000 mg | ORAL_TABLET | Freq: Once | ORAL | 0 refills | Status: AC

## 2018-12-03 NOTE — Telephone Encounter (Signed)
Test for candida (yeast) was positive.  Prescription for fluconazole 150mg po now, repeat dose in 3d if needed, #2 no refills, sent to the pharmacy of record.  Recheck or followup with PCP for further evaluation if symptoms are not improving.    Patient contacted and made aware of all results, all questions answered.   

## 2018-12-25 ENCOUNTER — Encounter (HOSPITAL_COMMUNITY): Payer: Self-pay | Admitting: Emergency Medicine

## 2018-12-25 ENCOUNTER — Other Ambulatory Visit: Payer: Self-pay

## 2018-12-25 ENCOUNTER — Ambulatory Visit (HOSPITAL_COMMUNITY)
Admission: EM | Admit: 2018-12-25 | Discharge: 2018-12-25 | Disposition: A | Discharge status: Home / Self Care | Payer: BLUE CROSS/BLUE SHIELD | Attending: Family Medicine | Admitting: Family Medicine

## 2018-12-25 DIAGNOSIS — M25511 Pain in right shoulder: Secondary | ICD-10-CM | POA: Diagnosis not present

## 2018-12-25 NOTE — ED Triage Notes (Signed)
Patient has a employment form that she needs completed.  Patient reports an old shoulder injury.  Patient recently had soreness that required her to be out of work.  Now requesting a note be completed in regards to limitation

## 2018-12-25 NOTE — ED Provider Notes (Signed)
[facility]   [id number] 12/25/18 Arrival Time: 1116  ASSESSMENT & PLAN:  1. Right shoulder pain, unspecified chronicity    H/O recurrent shoulder dislocation. No symptoms currently. Completed her work form that she brings with her today. May return to work; normal duties.   Follow-up Information    [facility]   Specialty:  Urgent Care Why:  As needed. Contact information: [address] [place] [id number] [phone]         Reviewed expectations re: course of current medical issues. Questions answered. Outlined signs and symptoms indicating need for more acute intervention. Patient verbalized understanding. After Visit Summary given.  SUBJECTIVE: History from: patient. Erin Ponce is a 23 y.o. female who reports a h/o recurrent right shoulder dislocations. Initial dislocation after trauma three years ago. "Just pops out and back in occasionally." Mentioned this at work and her work now requires clearance to return. These do not limit her activity. No current discomfort. No extremity sensation changes or weakness. No specific aggravating or alleviating factors reported with her normal work duties. Occasional ibuprofen if she needs; helps.  Past Surgical History:  Procedure Laterality Date  . NO PAST SURGERIES      ROS: As per HPI.   OBJECTIVE:  Vitals:   12/25/18 1132  BP: 113/74  Pulse: 82  Resp: 16  Temp: 98.7 F (37.1 C)  TempSrc: Oral  SpO2: 100%    General appearance: alert; no distress Extremities: . RUE: warm and well perfused; no localized tenderness over right shoulder; without gross deformities; with no swelling; with no bruising; ROM: normal without reported discomfort CV: brisk extremity capillary refill of RUE; 2+ radial pulse of RUE. Skin: warm and dry; no visible rashes Neurologic: gait normal; normal reflexes of RUE and LUE; normal sensation of RUE and LUE; normal strength  of RUE and LUE Psychological: alert and cooperative; normal mood and affect  Allergies  Allergen Reactions  . Latex Itching    Past Medical History:  Diagnosis Date  . Anxiety   . Dislocated knee 2008   R knee  . Miscarriage    Social History   Socioeconomic History  . Marital status: Single    Spouse name: Not on file  . Number of children: Not on file  . Years of education: Not on file  . Highest education level: Not on file  Occupational History  . Not on file  Social Needs  . Financial resource strain: Not on file  . Food insecurity:    Worry: Not on file    Inability: Not on file  . Transportation needs:    Medical: Not on file    Non-medical: Not on file  Tobacco Use  . Smoking status: Never Smoker  . Smokeless tobacco: Never Used  Substance and Sexual Activity  . Alcohol use: No  . Drug use: Yes    Types: Marijuana    Comment: recently stopped  . Sexual activity: Yes    Birth control/protection: None  Lifestyle  . Physical activity:    Days per week: Not on file    Minutes per session: Not on file  . Stress: Not on file  Relationships  . Social connections:    Talks on phone: Not on file    Gets together: Not on file    Attends religious service: Not on file    Active member of club or organization: Not on file    Attends meetings of clubs  or organizations: Not on file    Relationship status: Not on file  Other Topics Concern  . Not on file  Social History Narrative  . Not on file   Family History  Problem Relation Age of Onset  . Diabetes Mother   . Thyroid disease Mother    Past Surgical History:  Procedure Laterality Date  . NO PAST SURGERIES        Mardella Layman, MD 12/25/18 1145

## 2019-02-13 ENCOUNTER — Encounter (HOSPITAL_COMMUNITY): Payer: Self-pay | Admitting: Emergency Medicine

## 2019-02-13 ENCOUNTER — Other Ambulatory Visit: Payer: Self-pay

## 2019-02-13 ENCOUNTER — Ambulatory Visit (HOSPITAL_COMMUNITY)
Admission: EM | Admit: 2019-02-13 | Discharge: 2019-02-13 | Disposition: A | Discharge status: Home / Self Care | Payer: BLUE CROSS/BLUE SHIELD | Attending: Family Medicine | Admitting: Family Medicine

## 2019-02-13 DIAGNOSIS — N898 Other specified noninflammatory disorders of vagina: Secondary | ICD-10-CM | POA: Insufficient documentation

## 2019-02-13 LAB — POCT URINALYSIS DIP (DEVICE)
Bilirubin Urine: NEGATIVE
Glucose, UA: NEGATIVE mg/dL
Ketones, ur: NEGATIVE mg/dL
Nitrite: NEGATIVE
Protein, ur: NEGATIVE mg/dL
Specific Gravity, Urine: >=1.03 (ref 1.005–1.030)
Urobilinogen, UA: 0.2 mg/dL (ref 0.0–1.0)
pH: 7 (ref 5.0–8.0)

## 2019-02-13 LAB — POCT PREGNANCY, URINE: Preg Test, Ur: NEGATIVE

## 2019-02-13 MED ORDER — AZITHROMYCIN 250 MG PO TABS
1000.0000 mg | ORAL_TABLET | Freq: Once | ORAL | Status: AC
  Administered 2019-02-13: 1000 mg via ORAL

## 2019-02-13 MED ORDER — AZITHROMYCIN 250 MG PO TABS
ORAL_TABLET | ORAL | Status: AC
  Filled 2019-02-13: qty 1

## 2019-02-13 MED ORDER — CEFTRIAXONE SODIUM 250 MG IJ SOLR
INTRAMUSCULAR | Status: AC
  Filled 2019-02-13: qty 250

## 2019-02-13 MED ORDER — CEFTRIAXONE SODIUM 250 MG IJ SOLR
250.0000 mg | Freq: Once | INTRAMUSCULAR | Status: AC
  Administered 2019-02-13: 250 mg via INTRAMUSCULAR

## 2019-02-13 MED ORDER — LIDOCAINE HCL (PF) 1 % IJ SOLN
INTRAMUSCULAR | Status: AC
  Filled 2019-02-13: qty 2

## 2019-02-13 NOTE — Discharge Instructions (Signed)

## 2019-02-13 NOTE — ED Notes (Signed)
Patient able to ambulate independently  

## 2019-02-13 NOTE — ED Notes (Signed)
Noted pt's pregnancy test had not resulted when scanning meds, will consult with provider prior to giving medications.

## 2019-02-13 NOTE — ED Triage Notes (Signed)
Patient reports symptoms for a week.  Patient has vaginal discharge and feels tenderness with urinating.  Patient has used boric acid pills, but no change

## 2019-02-13 NOTE — ED Provider Notes (Signed)
[facility]   [id number] 02/13/19 Arrival Time: 3267  ASSESSMENT & PLAN:  1. Vaginal discharge    Recommend partner testing. Declines HIV/RPR testing.   Discharge Instructions     You have been given the following medications today for treatment of suspected gonorrhea and/or chlamydia:  cefTRIAXone (ROCEPHIN) injection 250 mg azithromycin (ZITHROMAX) tablet 1,000 mg  Even though we have treated you today, we have sent testing for sexually transmitted infections. We will notify you of any positive results once they are received. If required, we will prescribe any medications you might need.  Please refrain from all sexual activity for at least the next seven days.    Pending: Labs Reviewed  CERVICOVAGINAL ANCILLARY ONLY    Will notify of any positive results. Instructed to refrain from sexual activity for at least seven days.  Reviewed expectations re: course of current medical issues. Questions answered. Outlined signs and symptoms indicating need for more acute intervention. Patient verbalized understanding. After Visit Summary given.   SUBJECTIVE:  Erin Ponce is a 23 y.o. female who presents with complaint of vaginal discharge. Onset abrupt. First noticed several days ago. Describes discharge as thin and gray. Urinary symptoms: mild dysuria. Afebrile. No abdominal or pelvic pain. No n/v. No rashes or lesions. Sexually active with single female partner without regular condom use; new partner OTC treatment: Boric acid without . History of STI: none reported.  Patient's last menstrual period was 12/14/2018.  ROS: As per HPI. All other systems negative.   OBJECTIVE:  Vitals:   02/13/19 1015  BP: 113/73  Pulse: 77  Temp: 98.2 F (36.8 C)  TempSrc: Oral  SpO2: 100%    General appearance: alert, cooperative, appears stated age and no distress Throat: lips, mucosa, and tongue normal; teeth and gums normal CV: RRR Lungs: CTAB Back: no CVA  tenderness; FROM at waist Abdomen: soft, non-tender GU: deferred Skin: warm and dry Psychological: alert and cooperative; normal mood and affect.  Results for orders placed or performed during the hospital encounter of 02/13/19  POCT urinalysis dip (device)  Result Value Ref Range   Glucose, UA NEGATIVE NEGATIVE mg/dL   Bilirubin Urine NEGATIVE NEGATIVE   Ketones, ur NEGATIVE NEGATIVE mg/dL   Specific Gravity, Urine >=1.030 1.005 - 1.030   Hgb urine dipstick TRACE (A) NEGATIVE   pH 7.0 5.0 - 8.0   Protein, ur NEGATIVE NEGATIVE mg/dL   Urobilinogen, UA 0.2 0.0 - 1.0 mg/dL   Nitrite NEGATIVE NEGATIVE   Leukocytes,Ua TRACE (A) NEGATIVE  Pregnancy, urine POC  Result Value Ref Range   Preg Test, Ur NEGATIVE NEGATIVE    Labs Reviewed  CERVICOVAGINAL ANCILLARY ONLY    Allergies  Allergen Reactions  . Latex Itching    Past Medical History:  Diagnosis Date  . Anxiety   . Dislocated knee 2008   R knee  . Miscarriage    Family History  Problem Relation Age of Onset  . Diabetes Mother   . Thyroid disease Mother    Social History   Socioeconomic History  . Marital status: Single    Spouse name: Not on file  . Number of children: Not on file  . Years of education: Not on file  . Highest education level: Not on file  Occupational History  . Not on file  Social Needs  . Financial resource strain: Not on file  . Food insecurity:    Worry: Not on file    Inability: Not on file  . Transportation needs:  Medical: Not on file    Non-medical: Not on file  Tobacco Use  . Smoking status: Never Smoker  . Smokeless tobacco: Never Used  Substance and Sexual Activity  . Alcohol use: No  . Drug use: Yes    Types: Marijuana    Comment: recently stopped  . Sexual activity: Yes    Birth control/protection: None  Lifestyle  . Physical activity:    Days per week: Not on file    Minutes per session: Not on file  . Stress: Not on file  Relationships  . Social connections:     Talks on phone: Not on file    Gets together: Not on file    Attends religious service: Not on file    Active member of club or organization: Not on file    Attends meetings of clubs or organizations: Not on file    Relationship status: Not on file  . Intimate partner violence:    Fear of current or ex partner: Not on file    Emotionally abused: Not on file    Physically abused: Not on file    Forced sexual activity: Not on file  Other Topics Concern  . Not on file  Social History Narrative  . Not on file          Mardella LaymanHagler, Jenesis Martin, MD 02/13/19 1049

## 2019-02-14 LAB — CERVICOVAGINAL ANCILLARY ONLY
Bacterial vaginitis: POSITIVE — AB
Candida vaginitis: NEGATIVE
Chlamydia: NEGATIVE
Neisseria Gonorrhea: NEGATIVE
Trichomonas: NEGATIVE

## 2019-02-15 LAB — URINE CULTURE: Culture: >=100000 — AB

## 2019-02-18 ENCOUNTER — Telehealth (HOSPITAL_COMMUNITY): Payer: Self-pay | Admitting: Emergency Medicine

## 2019-02-18 MED ORDER — METRONIDAZOLE 500 MG PO TABS: 500.0000 mg | ORAL_TABLET | Freq: Two times a day (BID) | ORAL | 0 refills | Status: AC

## 2019-02-18 MED ORDER — CEPHALEXIN 500 MG PO CAPS: 500.0000 mg | ORAL_CAPSULE | Freq: Two times a day (BID) | ORAL | 0 refills | Status: AC

## 2019-02-18 NOTE — Telephone Encounter (Signed)
Urine culture positive for ecoli, pt wasn ot treated. Bacterial vaginosis is positive. This was not treated at the urgent care visit.  Flagyl 500 mg BID x 7 days #14 no refills sent to patients pharmacy of choice.   Patient contacted and made aware of all results, all questions answered. Will send keflex for UTI

## 2019-04-29 ENCOUNTER — Ambulatory Visit (HOSPITAL_COMMUNITY)
Admission: RE | Admit: 2019-04-29 | Discharge: 2019-04-29 | Disposition: A | Discharge status: Home / Self Care | Payer: BLUE CROSS/BLUE SHIELD | Source: Home / Self Care | Attending: Psychiatry | Admitting: Psychiatry

## 2019-04-29 ENCOUNTER — Emergency Department (HOSPITAL_COMMUNITY)
Admission: EM | Admit: 2019-04-29 | Discharge: 2019-04-29 | Discharge status: Absent without leave | Payer: BLUE CROSS/BLUE SHIELD

## 2019-04-29 ENCOUNTER — Emergency Department (HOSPITAL_COMMUNITY)
Admission: EM | Admit: 2019-04-29 | Discharge: 2019-04-29 | Disposition: A | Discharge status: Home / Self Care | Payer: BLUE CROSS/BLUE SHIELD | Attending: Emergency Medicine | Admitting: Emergency Medicine

## 2019-04-29 ENCOUNTER — Other Ambulatory Visit: Payer: Self-pay

## 2019-04-29 ENCOUNTER — Encounter (HOSPITAL_COMMUNITY): Payer: Self-pay | Admitting: *Deleted

## 2019-04-29 DIAGNOSIS — F1290 Cannabis use, unspecified, uncomplicated: Secondary | ICD-10-CM | POA: Insufficient documentation

## 2019-04-29 DIAGNOSIS — R45851 Suicidal ideations: Secondary | ICD-10-CM | POA: Diagnosis not present

## 2019-04-29 DIAGNOSIS — Z833 Family history of diabetes mellitus: Secondary | ICD-10-CM | POA: Insufficient documentation

## 2019-04-29 DIAGNOSIS — Z91040 Latex allergy status: Secondary | ICD-10-CM | POA: Insufficient documentation

## 2019-04-29 DIAGNOSIS — F419 Anxiety disorder, unspecified: Secondary | ICD-10-CM | POA: Insufficient documentation

## 2019-04-29 DIAGNOSIS — F339 Major depressive disorder, recurrent, unspecified: Secondary | ICD-10-CM | POA: Insufficient documentation

## 2019-04-29 DIAGNOSIS — Z8349 Family history of other endocrine, nutritional and metabolic diseases: Secondary | ICD-10-CM | POA: Insufficient documentation

## 2019-04-29 DIAGNOSIS — F332 Major depressive disorder, recurrent severe without psychotic features: Secondary | ICD-10-CM | POA: Insufficient documentation

## 2019-04-29 LAB — COMPREHENSIVE METABOLIC PANEL
ALT: 10 U/L (ref 0–44)
AST: 13 U/L — ABNORMAL LOW (ref 15–41)
Albumin: 4.2 g/dL (ref 3.5–5.0)
Alkaline Phosphatase: 56 U/L (ref 38–126)
Anion gap: 7 (ref 5–15)
BUN: 9 mg/dL (ref 6–20)
CO2: 23 mmol/L (ref 22–32)
Calcium: 9.1 mg/dL (ref 8.9–10.3)
Chloride: 107 mmol/L (ref 98–111)
Creatinine, Ser: 0.81 mg/dL (ref 0.44–1.00)
GFR calc Af Amer: >60 mL/min (ref >60)
GFR calc non Af Amer: >60 mL/min (ref >60)
Glucose, Bld: 95 mg/dL (ref 70–99)
Potassium: 3.6 mmol/L (ref 3.5–5.1)
Sodium: 137 mmol/L (ref 135–145)
Total Bilirubin: 0.5 mg/dL (ref 0.3–1.2)
Total Protein: 6.7 g/dL (ref 6.5–8.1)

## 2019-04-29 LAB — RAPID URINE DRUG SCREEN, HOSP PERFORMED
Amphetamines: NOT DETECTED
Barbiturates: NOT DETECTED
Benzodiazepines: NOT DETECTED
Cocaine: NOT DETECTED
Opiates: NOT DETECTED
Tetrahydrocannabinol: POSITIVE — AB

## 2019-04-29 LAB — I-STAT BETA HCG BLOOD, ED (MC, WL, AP ONLY): I-stat hCG, quantitative: <5 m[IU]/mL (ref <5)

## 2019-04-29 LAB — CBC
HCT: 40 % (ref 36.0–46.0)
Hemoglobin: 12.8 g/dL (ref 12.0–15.0)
MCH: 28.3 pg (ref 26.0–34.0)
MCHC: 32 g/dL (ref 30.0–36.0)
MCV: 88.5 fL (ref 80.0–100.0)
Platelets: 294 10*3/uL (ref 150–400)
RBC: 4.52 MIL/uL (ref 3.87–5.11)
RDW: 14.2 % (ref 11.5–15.5)
WBC: 6.4 10*3/uL (ref 4.0–10.5)
nRBC: 0 % (ref 0.0–0.2)

## 2019-04-29 LAB — ETHANOL: Alcohol, Ethyl (B): <10 mg/dL (ref <10)

## 2019-04-29 LAB — ACETAMINOPHEN LEVEL: Acetaminophen (Tylenol), Serum: <10 ug/mL — ABNORMAL LOW (ref 10–30)

## 2019-04-29 LAB — SALICYLATE LEVEL: Salicylate Lvl: <7 mg/dL (ref 2.8–30.0)

## 2019-04-29 NOTE — ED Notes (Signed)
Pt belongings found at green zone nurse's station at 0700. Belongings found to not be inventoried, this NT and Cytogeneticist inventoried belongings. Belongings inventoried and placed in purple zone cabinet locker #9 (bottom shelf). Valuables given to security, valuables envelope placed in safe. Pts laptop computer given to security and placed on shelf in security office near safe due to laptop being too large to fit in safe. Note about location of laptop on inventory sheet.

## 2019-04-29 NOTE — ED Notes (Signed)
When inventorying pt belongings, found pill bottle with 9- 100mg  doxycycline capsules. Caitlynn RN filled out pharmacy sheet but when this NT went to take meds to pharmacy, found that pill bottle belonged to a different pt. This NT asked pharmacist in ED about if we should still take rx to main pharmacy. Pharmacist stated she was unsure and that pharmacy would not accept the rx if it did not belong to the pt. This NT spoke to pt who stated the rx belonged to her cousin and she was taking it d/t a yeast infection. Pill bottle placed in locker 9 with pt belongings.

## 2019-04-29 NOTE — BH Assessment (Addendum)
Assessment Note  Erin Ponce is an 23 y.o. female who presents to [facility] as a walk-in. Pt states she has been feeling depressed and has constant thoughts of wanting to hurt herself. Pt called TTS crisis line prior to coming to [facility] and stated she did not feel safe. Pt states she has been engaging in self-harm, scratching herself, banging her head on the wall, and hitting herself in the head. Pt states she last engaged in self-harm PTA. Pt states she is about to be homeless because she is not working and cannot afford to continue living with her roommate. Pt states "I just can't take it anymore." Pt is somber during the assessment and guarded. Pt states she does not have a plan to commit suicide but she does not feel safe with herself. Pt has been admitted to [facility] in the past in 2019 c/o depression. Pt states she used to see "Mr. Bill" at [place] but she has not seen a [place] provider in over a year. Pt states she has not been eating well or sleeping well due to depression and repeats "I just can't take this anymore." Pt is unable to contract for safety.   Per Lindon Romp, NP pt is recommended for continued observation for safety and stabilization and to be reassessed in the AM by psych. Pt to be admitted to [facility] OBS.   Diagnosis: MDD, recurrent, severe, w/o psychosis  Past Medical History:  Past Medical History:  Diagnosis Date  . Anxiety   . Dislocated knee 2008   R knee  . Miscarriage     Past Surgical History:  Procedure Laterality Date  . NO PAST SURGERIES      Family History:  Family History  Problem Relation Age of Onset  . Diabetes Mother   . Thyroid disease Mother     Social History:  reports that she has never smoked. She has never used smokeless tobacco. She reports current drug use. Drug: Marijuana. She reports that she does not drink alcohol.  Additional Social History:  Alcohol / Drug Use Pain Medications: See MAR Prescriptions: See MAR Over the Counter: See MAR History of  alcohol / drug use?: No history of alcohol / drug abuse  CIWA:   COWS:    Allergies:  Allergies  Allergen Reactions  . Latex Itching    Home Medications: (Not in a hospital admission)   OB/GYN Status:  No LMP recorded.  General Assessment Data Location of Assessment: [facility] Services TTS Assessment: In system Is this a Tele or Face-to-Face Assessment?: Face-to-Face Is this an Initial Assessment or a Re-assessment for this encounter?: Initial Assessment Patient Accompanied by:: N/A Language Other than English: No Living Arrangements: Other (Comment) What gender do you identify as?: Female Marital status: Single Pregnancy Status: No Living Arrangements: Non-relatives/Friends Can pt return to current living arrangement?: Yes Admission Status: Voluntary Is patient capable of signing voluntary admission?: Yes Referral Source: Self/Family/Friend Insurance type: [employer] Screening Exam ([place]) Medical Exam completed: Yes  Crisis Care Plan Living Arrangements: Non-relatives/Friends Name of Psychiatrist: none Name of Therapist: none  Education Status Is patient currently in school?: No Is the patient employed, unemployed or receiving disability?: Unemployed  Risk to self with the past 6 months Suicidal Ideation: No-Not Currently/Within Last 6 Months Has patient been a risk to self within the past 6 months prior to admission? : Yes Suicidal Intent: No Has patient had any suicidal intent within the past 6 months prior to admission? : No  Is patient at risk for suicide?: Yes Suicidal Plan?: No Has patient had any suicidal plan within the past 6 months prior to admission? : No Access to Means: No What has been your use of drugs/alcohol within the last 12 months?: denies Previous Attempts/Gestures: Yes How many times?: 1 Other Self Harm Risks: hx of self-harming Triggers for Past Attempts: Unpredictable Intentional Self Injurious Behavior: Bruising,  Damaging Comment - Self Injurious Behavior: bangs head, scratch self Family Suicide History: No Recent stressful life event(s): Job Loss, Financial Problems, Other (Comment)(possibly homeless) Persecutory voices/beliefs?: No Depression: Yes Depression Symptoms: Despondent, Tearfulness, Isolating, Fatigue, Loss of interest in usual pleasures, Feeling worthless/self pity Substance abuse history and/or treatment for substance abuse?: No Suicide prevention information given to non-admitted patients: Not applicable  Risk to Others within the past 6 months Homicidal Ideation: No Does patient have any lifetime risk of violence toward others beyond the six months prior to admission? : No Thoughts of Harm to Others: No Current Homicidal Intent: No Current Homicidal Plan: No Access to Homicidal Means: No History of harm to others?: No Assessment of Violence: None Noted Does patient have access to weapons?: No Criminal Charges Pending?: No Does patient have a court date: No Is patient on probation?: No  Psychosis Hallucinations: None noted Delusions: None noted  Mental Status Report Appearance/Hygiene: Unremarkable Eye Contact: Good Motor Activity: Freedom of movement Speech: Logical/coherent, Soft Level of Consciousness: Alert Mood: Depressed, Sad, Sullen, Despair Affect: Depressed, Sad, Sullen, Flat Anxiety Level: None Thought Processes: Relevant, Coherent Judgement: Impaired Orientation: Person, Place, Time, Situation, Appropriate for developmental age Obsessive Compulsive Thoughts/Behaviors: None  Cognitive Functioning Concentration: Normal Memory: Remote Intact, Recent Intact Is patient IDD: No Insight: Poor Impulse Control: Poor Appetite: Fair Have you had any weight changes? : No Change Sleep: Decreased Total Hours of Sleep: 6 Vegetative Symptoms: None  ADLScreening [facility] Assessment Services) Patient's cognitive ability adequate to safely complete daily activities?:  Yes Patient able to express need for assistance with ADLs?: Yes Independently performs ADLs?: Yes (appropriate for developmental age)  Prior Inpatient Therapy Prior Inpatient Therapy: Yes Prior Therapy Dates: 2019 [facility] Reason for Treatment: MDD (major depressive disorder), recurrent, severe, with psychosis (HCC)  Prior Outpatient Therapy Prior Outpatient Therapy: Yes Prior Therapy Dates: 2019 Prior Therapy Facilty/Provider(s): [facility] Reason for Treatment: MDD Does patient have an ACCT team?: No Does patient have Intensive In-House Services?  : No Does patient have [facility] services? : No Does patient have P4CC services?: No  ADL Screening (condition at time of admission) Patient's cognitive ability adequate to safely complete daily activities?: Yes Is the patient deaf or have difficulty hearing?: No Does the patient have difficulty seeing, even when wearing glasses/contacts?: No Does the patient have difficulty concentrating, remembering, or making decisions?: No Patient able to express need for assistance with ADLs?: Yes Does the patient have difficulty dressing or bathing?: No Independently performs ADLs?: Yes (appropriate for developmental age) Does the patient have difficulty walking or climbing stairs?: No Weakness of Legs: None Weakness of Arms/Hands: None  Home Assistive Devices/Equipment Home Assistive Devices/Equipment: None    Abuse/Neglect Assessment (Assessment to be complete while patient is alone) Abuse/Neglect Assessment Can Be Completed: Yes Physical Abuse: Denies Verbal Abuse: Denies Sexual Abuse: Denies Exploitation of patient/patient's resources: Denies Self-Neglect: Denies     Merchant navy officerAdvance Directives (For Healthcare) Does Patient Have a Medical Advance Directive?: No Would patient like information on creating a medical advance directive?: No - Patient declined  Disposition: Per Nira ConnJason Berry, NP pt is  recommended for continued observation for safety and stabilization and to be reassessed in the AM by psych. [facility]   Disposition Initial Assessment Completed for this Encounter: Yes Disposition of Patient: (overnight OBS pending AM psych assessment) Patient refused recommended treatment: No  On Site Evaluation by:   Reviewed with Physician:    Karolee OhsAquicha R Batsheva Stevick 04/29/2019 1:07 AM

## 2019-04-29 NOTE — BHH Counselor (Signed)
@  1700 this clinician informed provider that patient needed to be seen for psych eval

## 2019-04-29 NOTE — ED Provider Notes (Signed)
[facility] EMERGENCY DEPARTMENT Provider Note   CSN: [id number] Arrival date & time: 04/29/19  0158     History   Chief Complaint Chief Complaint  Patient presents with  . Suicidal    HPI Erin Ponce is a 23 y.o. female.      Mental Health Problem Presenting symptoms: suicidal thoughts   Degree of incapacity (severity):  Moderate Onset quality:  Gradual Timing:  Constant Progression:  Worsening Chronicity:  Recurrent Relieved by:  None tried Worsened by:  Nothing Ineffective treatments:  None tried Associated symptoms: no abdominal pain     Past Medical History:  Diagnosis Date  . Anxiety   . Dislocated knee 2008   R knee  . Miscarriage     Patient Active Problem List   Diagnosis Date Noted  . MDD (major depressive disorder), recurrent, severe, with psychosis ([place]) 03/31/2018  . Severe episode of recurrent major depressive disorder, with psychotic features ([place])   . Psychosis ([place]) 03/30/2018  . 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 NO PAST SURGERIES       OB History    Gravida  1   Para      Term      Preterm      AB      Living        SAB      TAB      Ectopic      Multiple      Live Births               Home Medications    Prior to Admission medications   Not on File    Family History Family History  Problem Relation Age of Onset  . Diabetes Mother   . Thyroid disease Mother     Social History Social History   Tobacco Use  . Smoking status: Never Smoker  . Smokeless tobacco: Never Used  Substance Use Topics  . Alcohol use: No  . Drug use: Yes    Types: Marijuana    Comment: recently stopped     Allergies   Latex   Review of Systems Review of Systems  Gastrointestinal: Negative for abdominal pain.  Psychiatric/Behavioral: Positive for suicidal ideas.  All other systems reviewed and are negative.    Physical  Exam Updated Vital Signs BP (!) 143/97 (BP Location: Right Arm)   Pulse 84   Temp 98.8 F (37.1 C) (Oral)   Resp 18   SpO2 96%   Physical Exam Vitals signs and nursing note reviewed.  Constitutional:      Appearance: She is well-developed.  HENT:     Head: Normocephalic and atraumatic.     Mouth/Throat:     Mouth: Mucous membranes are dry.     Pharynx: Oropharynx is clear.  Eyes:     Extraocular Movements: Extraocular movements intact.     Conjunctiva/sclera: Conjunctivae normal.  Neck:     Musculoskeletal: Normal range of motion.  Cardiovascular:     Rate and Rhythm: Normal rate and regular rhythm.  Pulmonary:     Effort: No respiratory distress.     Breath sounds: No stridor.  Abdominal:     General: There is no distension.  Musculoskeletal: Normal range of motion.        General: No swelling or tenderness.  Skin:    General: Skin is warm and dry.  Neurological:     General: No  focal deficit present.     Mental Status: She is alert and oriented to person, place, and time.  Psychiatric:        Thought Content: Thought content includes suicidal ideation. Thought content does not include suicidal plan.      ED Treatments / Results  Labs (all labs ordered are listed, but only abnormal results are displayed) Labs Reviewed  COMPREHENSIVE METABOLIC PANEL - Abnormal; Notable for the following components:      Result Value   AST 13 (*)    All other components within normal limits  ACETAMINOPHEN LEVEL - Abnormal; Notable for the following components:   Acetaminophen (Tylenol), Serum <10 (*)    All other components within normal limits  ETHANOL  SALICYLATE LEVEL  CBC  RAPID URINE DRUG SCREEN, HOSP PERFORMED  I-STAT BETA HCG BLOOD, ED ([facility], [facility], [facility] ONLY)    EKG None  Radiology No results found.  Procedures Procedures (including critical care time)  Medications Ordered in ED Medications - No data to display   Initial Impression / Assessment and Plan / ED  Course  I have reviewed the triage vital signs and the nursing notes.  Pertinent labs & imaging results that were available during my care of the patient were reviewed by me and considered in my medical decision making (see chart for details).  Suicidal without plan. [facility] but [facility] some [facility]. Medically cleared.   Final Clinical Impressions(s) / ED Diagnoses   Final diagnoses:  Suicidal thoughts    ED Discharge Orders    None       Wendie Diskin, Barbara CowerJason, MD 04/29/19 [phone]

## 2019-04-29 NOTE — Consult Note (Addendum)
                                      Tele-psych Consult Note   Patient seen and chart is reviewed. She reports decreased symptoms of anxiety today stating "I had some time to rest. I just got very overwhelmed. I am in school. I also am having to figure out different housing in two months. I don't really want to have to live with my parents. I guess it's my pride. I do not want to hurt myself or anyone else. I miss my two year old daughter. I would like to go home. Just sitting here is not really helping me. You can talk to my mother if you would like." Patient has been observed since last night. She has not engaged in any self-injurious behaviors since admission. Writer spoke to her mother Robet Leu at [phone] who did not have any safety concerns regarding the patient being discharged home. Patient reports that her cousin will be at home tonight and she will have emotional support. The patient also plans to follow up with [facility] for psychotherapy. Lerlene appears very future oriented and is not determined to be a danger to herself at this time. Stable to discharge home with plan to follow up with outpatient care at [facility]. Case discussed with Dr. Mariea Clonts who agrees with disposition.    Patient's chart reviewed. Reviewed the information documented and agree with the treatment plan.  Buford Dresser, DO 04/29/19 7:03 PM

## 2019-04-29 NOTE — H&P (Signed)
Behavioral Health Medical Screening Exam  Erin Ponce is an 23 y.o. female.  Total Time spent with patient: 20 minutes  Psychiatric Specialty Exam: Physical Exam  Constitutional: She is oriented to person, place, and time. She appears well-developed and well-nourished. No distress.  HENT:  Head: Normocephalic and atraumatic.  Right Ear: External ear normal.  Left Ear: External ear normal.  Eyes: Pupils are equal, round, and reactive to light. Right eye exhibits no discharge. Left eye exhibits no discharge.  Respiratory: Effort normal. No respiratory distress.  Musculoskeletal: Normal range of motion.  Neurological: She is alert and oriented to person, place, and time.  Skin: She is not diaphoretic.  Psychiatric: Her mood appears anxious. She is withdrawn. She is not actively hallucinating. Thought content is not paranoid and not delusional. She exhibits a depressed mood. She expresses suicidal ideation. She expresses no homicidal ideation. She expresses no suicidal plans.    Review of Systems  Constitutional: Positive for malaise/fatigue. Negative for chills, diaphoresis, fever and weight loss.  Respiratory: Negative for cough and shortness of breath.   Cardiovascular: Negative for chest pain.  Gastrointestinal: Negative for diarrhea, nausea and vomiting.  Psychiatric/Behavioral: Positive for depression and suicidal ideas. Negative for hallucinations, memory loss and substance abuse. The patient is nervous/anxious and has insomnia.   All other systems reviewed and are negative.   currently breastfeeding.There is no height or weight on file to calculate BMI.  General Appearance: Casual and Fairly Groomed  Eye Contact:  Poor  Speech:  Clear and Coherent and Slow  Volume:  Decreased  Mood:  Anxious, Depressed, Hopeless and Worthless  Affect:  Congruent and Depressed  Thought Process:  Coherent and Descriptions of Associations: Intact  Orientation:  Full (Time, Place, and Person)   Thought Content:  Logical and Hallucinations: None  Suicidal Thoughts:  Yes.  without intent/plan  Homicidal Thoughts:  No  Memory:  Immediate;   Fair Recent;   Fair  Judgement:  Impaired  Insight:  Lacking  Psychomotor Activity:  Psychomotor Retardation  Concentration: Concentration: Fair and Attention Span: Fair  Recall:  [employer] of Knowledge:Good  Language: Good  Akathisia:  Negative  Handed:  Right  AIMS (if indicated):     Assets:  Desire for Improvement Financial Resources/Insurance Housing Leisure Time Physical Health  Sleep:       Musculoskeletal: Strength & Muscle Tone: within normal limits Gait & Station: normal Patient leans: N/A  currently breastfeeding.  Recommendations:  Based on my evaluation the patient does not appear to have an emergency medical condition.  Rozetta Nunnery, NP 04/29/2019, 4:11 AM

## 2019-04-29 NOTE — ED Triage Notes (Signed)
Pt arrives from [facility] with reports of SI, she could not contract for safety during TTS consult. She has had constant thoughts of hurting herself, scratching and hitting her head on the wall. She is alert and cooperative during triage.   Pt will be AM psych eval.

## 2019-07-07 ENCOUNTER — Ambulatory Visit (INDEPENDENT_AMBULATORY_CARE_PROVIDER_SITE_OTHER)
Admission: RE | Admit: 2019-07-07 | Discharge: 2019-07-07 | Disposition: A | Discharge status: Home / Self Care | Payer: BLUE CROSS/BLUE SHIELD | Source: Ambulatory Visit

## 2019-07-07 DIAGNOSIS — R21 Rash and other nonspecific skin eruption: Secondary | ICD-10-CM | POA: Diagnosis not present

## 2019-07-07 MED ORDER — TRIAMCINOLONE ACETONIDE 0.1 % EX CREA: 1.0000 "application " | TOPICAL_CREAM | Freq: Two times a day (BID) | CUTANEOUS | 0 refills | Status: DC

## 2019-07-07 MED ORDER — PERMETHRIN 5 % EX CREA: TOPICAL_CREAM | CUTANEOUS | 0 refills | Status: DC

## 2019-07-07 MED ORDER — CETIRIZINE HCL 10 MG PO CAPS: 10.0000 mg | ORAL_CAPSULE | Freq: Every day | ORAL | 0 refills | Status: DC

## 2019-07-07 NOTE — Discharge Instructions (Addendum)
Please apply permethrin cream head to toe, leave on for 8 to 10 hours then wash off Wash all linens in hot water May use triamcinolone cream twice daily to help with itching Daily cetirizine/Zyrtec to further help with itching  Please monitor for rash to resolve, follow-up in person if rash persisting/changing

## 2019-07-07 NOTE — ED Provider Notes (Addendum)
Virtual Visit via Video Note:  Carren Blakley  initiated request for Telemedicine visit with [facility] Urgent Care team. I connected with Erin Ponce  on 07/08/2019 at 10:14 AM  for a synchronized telemedicine visit using a video enabled HIPPA compliant telemedicine application. I verified that I am speaking with Erin Ponce  using two identifiers. Evianna Chandran C Erven Ramson, PA-C  was physically located in a [facility] Urgent care site and Paighton Godette was located at a different location.   The limitations of evaluation and management by telemedicine as well as the availability of in-person appointments were discussed. Patient was informed that she  may incur a bill ( including co-pay) for this virtual visit encounter. Aracely Teegarden  expressed understanding and gave verbal consent to proceed with virtual visit.     History of Present Illness:Erin Ponce  is a 23 y.o. female presents for evaluation of a rash.  Patient states that over the past 2 to 3 days she is starting to develop purplish colored bumps to her trunk and has since spread to her arms.  She initially noticed 1 bump, but now she has multiple.  Rashes associated itching, denies associated pain.  [name] concerned about possible bedbugs as she notes that her neighbors in her apartment complex have been known infestation.  She did have pest control, and they did not see any specific bugs, but she feels she has seen some bugs in their apartment.  She also notes that her roommate has guttate psoriasis and feels her rash looks very similar.  She denies any fevers chills or body aches.  Denies associated cold symptoms.  Denies nausea or vomiting.  She has tried using her roommates cream for her psoriasis which has helped some.  Denies any other new soaps, lotions, detergents, hygiene products.  Denies any new medicines or foods.  Did recently have crab, but this was after her rash started.   Allergies  Allergen Reactions  . Latex  Itching     Past Medical History:  Diagnosis Date  . Anxiety   . Dislocated knee 2008   R knee  . Miscarriage      Social History   Tobacco Use  . Smoking status: Never Smoker  . Smokeless tobacco: Never Used  Substance Use Topics  . Alcohol use: No  . Drug use: Yes    Types: Marijuana    Comment: recently stopped        Observations/Objective: Physical Exam  Constitutional: She is well-developed, well-nourished, and in no distress. No distress.  No acute distress  HENT:  Head: Normocephalic and atraumatic.  Neck: Normal range of motion.  Pulmonary/Chest: Effort normal. No respiratory distress.  Speaking full sentences  Musculoskeletal:     Comments: Moving all extremities appropriately  Skin:  Circular slightly purpleish papular lesions noted on abdomen and back, few lesions noted to hands, patient denies any lesions on palms or in mouth     Assessment and Plan:    ICD-10-CM   1. Rash and nonspecific skin eruption  R21     Trigger of rash, does not seem allergic at this time.  Will treat for possible bedbugs/scabies and provide permethrin to use topically once followed by triamcinolone twice daily.  Advised to wash all linens with hot water, continue to monitor for any signs of bugs within the house.  Would expect if rash inflammatory to improve with triamcinolone cream, due to quality of video cannot rule out possible pityriasis, but given associated itching feel  this is less likely.  Continue to monitor, follow-up in person for further evaluation of rash if persisting or worsening.  Does not seem to have any systemic symptoms at this time.   Follow Up Instructions:     I discussed the assessment and treatment plan with the patient. The patient was provided an opportunity to ask questions and all were answered. The patient agreed with the plan and demonstrated an understanding of the instructions.   The patient was advised to call back or seek an in-person  evaluation if the symptoms worsen or if the condition fails to improve as anticipated.      Janith Lima, PA-C  07/08/2019 10:14 AM         Janith Lima, PA-C 07/08/19 1016    Beatriz Quintela, [place] C, PA-C 07/08/19 1017

## 2019-07-18 ENCOUNTER — Encounter (HOSPITAL_COMMUNITY): Payer: Self-pay

## 2019-07-18 ENCOUNTER — Ambulatory Visit (HOSPITAL_COMMUNITY)
Admission: EM | Admit: 2019-07-18 | Discharge: 2019-07-18 | Disposition: A | Discharge status: Home / Self Care | Payer: BLUE CROSS/BLUE SHIELD

## 2019-07-18 ENCOUNTER — Other Ambulatory Visit: Payer: Self-pay

## 2019-07-18 DIAGNOSIS — R21 Rash and other nonspecific skin eruption: Secondary | ICD-10-CM

## 2019-07-18 DIAGNOSIS — B888 Other specified infestations: Secondary | ICD-10-CM

## 2019-07-18 DIAGNOSIS — L299 Pruritus, unspecified: Secondary | ICD-10-CM

## 2019-07-18 MED ORDER — HYDROXYZINE HCL 25 MG PO TABS: 12.5000 mg | ORAL_TABLET | Freq: Three times a day (TID) | ORAL | 0 refills | Status: DC | PRN

## 2019-07-18 MED ORDER — IVERMECTIN 3 MG PO TABS: ORAL_TABLET | ORAL | 0 refills | Status: DC

## 2019-07-18 MED ORDER — PREDNISONE 20 MG PO TABS: ORAL_TABLET | ORAL | 0 refills | Status: DC

## 2019-07-18 NOTE — ED Provider Notes (Signed)
[facility]   MRN: [id number] DOB: Oct 16, 1995  Subjective:   Erin Ponce is a 23 y.o. female presenting for 3 week history of ongoing persistent pruritic rash. She had a VV for this rash on 07/07/2019. She used triamcinolone and permethrin cream as prescribed.  States that the triamcinolone has helped her more consistently.  Initially, patient thought that she had scabies but eventually technicians found bed bugs after all.  Since they did not extermination of the bedbugs, her rash has been steadily improving but she still worried that it is present after 2 to 3 weeks.  Patient denies any kind of illness including fever, headache, sore throat, cough, malaise during or before her rash.  She has had a toddler that has not manifested any kind of rash.  Patient would like more aggressive treatment to resolve her rash.  She is taking Zyrtec and Benadryl every day.  She also uses a weight management pill which she started months ago.  No current facility-administered medications for this encounter.   Current Outpatient Medications:  .  diphenhydrAMINE (BENADRYL) 50 MG tablet, Take 50 mg by mouth at bedtime as needed for itching., Disp: , Rfl:  .  UNABLE TO FIND, Apetamin, Disp: , Rfl:  .  Cetirizine HCl 10 MG CAPS, Take 1 capsule (10 mg total) by mouth daily., Disp: 20 capsule, Rfl: 0 .  permethrin (ELIMITE) 5 % cream, Apply head to toe then was off 8-10 hours later, Disp: 60 g, Rfl: 0 .  triamcinolone cream (KENALOG) 0.1 %, Apply 1 application topically 2 (two) times daily., Disp: 80 g, Rfl: 0   Allergies  Allergen Reactions  . Latex Itching    Past Medical History:  Diagnosis Date  . Anxiety   . Dislocated knee 2008   R knee  . Miscarriage      Past Surgical History:  Procedure Laterality Date  . NO PAST SURGERIES      Family History  Problem Relation Age of Onset  . Diabetes Mother   . Thyroid disease Mother     Social History   Tobacco Use  . Smoking status:  Never Smoker  . Smokeless tobacco: Never Used  Substance Use Topics  . Alcohol use: No  . Drug use: Yes    Types: Marijuana    Comment: recently stopped    ROS   Objective:   Vitals: BP 110/64 (BP Location: Right Arm)   Pulse 85   Temp 98.7 F (37.1 C) (Oral)   Resp 15   LMP 07/07/2019   SpO2 97%   Physical Exam Constitutional:      General: She is not in acute distress.    Appearance: Normal appearance. She is well-developed. She is not ill-appearing, toxic-appearing or diaphoretic.  HENT:     Head: Normocephalic and atraumatic.     Nose: Nose normal.     Mouth/Throat:     Mouth: Mucous membranes are moist.     Pharynx: Oropharynx is clear.  Eyes:     General: No scleral icterus.    Extraocular Movements: Extraocular movements intact.     Pupils: Pupils are equal, round, and reactive to light.  Cardiovascular:     Rate and Rhythm: Normal rate and regular rhythm.     Pulses: Normal pulses.     Heart sounds: Normal heart sounds. No murmur. No friction rub. No gallop.   Pulmonary:     Effort: Pulmonary effort is normal. No respiratory distress.     Breath sounds:  Normal breath sounds. No stridor. No wheezing, rhonchi or rales.  Skin:    General: Skin is warm and dry.     Findings: Rash (multiple dry scaly lesions of varying sizes and shapes over torso, back, proximal extremities) present.  Neurological:     General: No focal deficit present.     Mental Status: She is alert and oriented to person, place, and time.  Psychiatric:        Mood and Affect: Mood normal.        Behavior: Behavior normal.        Thought Content: Thought content normal.     Assessment and Plan :   1. Rash and nonspecific skin eruption   2. Infestation by bed bug   3. Itching     Patient insisted on scabies treatment but I do not suspect that this is the case. Still, I will have her use Ivermectin to address this. It is possible she has atypical presentation for pityriasis given  several week history of this persistent rash but she does not have the typical kind of illness that proceeds and the rash is also pruritic.  Counseled on possibility of contact dermatitis.  She was agreeable to using prednisone course and Vistaril.  She also had concerns about guttate psoriasis given that her cousin has this and has similar lesions.  Recommend that she establish care with the PCP through [facility] internal medicine, seek consult with a dermatologist to rule this out. Counseled patient on potential for adverse effects with medications prescribed/recommended today, ER and return-to-clinic precautions discussed, patient verbalized understanding.    Jaynee Eagles, PA-C 07/18/19 1547

## 2019-07-18 NOTE — ED Triage Notes (Signed)
Pt states having an itchy rash all over her body x 3 weeks. Pt did a virtual visit 1 week ago and they prescribed cetirizine 10 mg, permethrin cream and triamcinolone cream, pt states the rash improved.

## 2019-08-24 ENCOUNTER — Other Ambulatory Visit: Payer: Self-pay

## 2019-08-24 ENCOUNTER — Encounter (HOSPITAL_COMMUNITY): Payer: Self-pay | Admitting: Emergency Medicine

## 2019-08-24 ENCOUNTER — Emergency Department (HOSPITAL_COMMUNITY)
Admission: EM | Admit: 2019-08-24 | Discharge: 2019-08-24 | Disposition: A | Discharge status: Home / Self Care | Payer: BLUE CROSS/BLUE SHIELD | Attending: Emergency Medicine | Admitting: Emergency Medicine

## 2019-08-24 ENCOUNTER — Ambulatory Visit (INDEPENDENT_AMBULATORY_CARE_PROVIDER_SITE_OTHER)
Admission: EM | Admit: 2019-08-24 | Discharge: 2019-08-24 | Disposition: A | Discharge status: Home / Self Care | Payer: BLUE CROSS/BLUE SHIELD | Source: Home / Self Care

## 2019-08-24 ENCOUNTER — Emergency Department (HOSPITAL_COMMUNITY): Payer: BLUE CROSS/BLUE SHIELD

## 2019-08-24 DIAGNOSIS — R200 Anesthesia of skin: Secondary | ICD-10-CM

## 2019-08-24 DIAGNOSIS — Z23 Encounter for immunization: Secondary | ICD-10-CM | POA: Diagnosis not present

## 2019-08-24 DIAGNOSIS — Y929 Unspecified place or not applicable: Secondary | ICD-10-CM | POA: Diagnosis not present

## 2019-08-24 DIAGNOSIS — S61216A Laceration without foreign body of right little finger without damage to nail, initial encounter: Secondary | ICD-10-CM | POA: Diagnosis not present

## 2019-08-24 DIAGNOSIS — Y999 Unspecified external cause status: Secondary | ICD-10-CM | POA: Insufficient documentation

## 2019-08-24 DIAGNOSIS — S81811A Laceration without foreign body, right lower leg, initial encounter: Secondary | ICD-10-CM | POA: Diagnosis not present

## 2019-08-24 DIAGNOSIS — Y9389 Activity, other specified: Secondary | ICD-10-CM | POA: Diagnosis not present

## 2019-08-24 MED ORDER — TETANUS-DIPHTH-ACELL PERTUSSIS 5-2.5-18.5 LF-MCG/0.5 IM SUSP
0.5000 mL | Freq: Once | INTRAMUSCULAR | Status: AC
  Administered 2019-08-24: 0.5 mL via INTRAMUSCULAR
  Filled 2019-08-24: qty 0.5

## 2019-08-24 MED ORDER — LIDOCAINE HCL (PF) 1 % IJ SOLN
10.0000 mL | Freq: Once | INTRAMUSCULAR | Status: AC
  Administered 2019-08-24: 20:00:00 10 mL
  Filled 2019-08-24: qty 10

## 2019-08-24 MED ORDER — CEPHALEXIN 250 MG PO CAPS
500.0000 mg | ORAL_CAPSULE | Freq: Once | ORAL | Status: AC
  Administered 2019-08-24: 500 mg via ORAL
  Filled 2019-08-24: qty 2

## 2019-08-24 MED ORDER — CEPHALEXIN 500 MG PO CAPS: 500.0000 mg | ORAL_CAPSULE | Freq: Three times a day (TID) | ORAL | 0 refills | Status: DC

## 2019-08-24 NOTE — Discharge Instructions (Signed)
Please report to the ER as you have signs of ligament and/or tendon damage of your pinky finger and need to have a consult for this. Please do not remove the splint until you are evaluated.

## 2019-08-24 NOTE — ED Notes (Signed)
Called Ortho Tech for application of finger splint.
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
## 2019-08-24 NOTE — ED Notes (Signed)
Ortho at bedside to apply splint.
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
## 2019-08-24 NOTE — Discharge Instructions (Signed)
Please read and follow all provided instructions.  Your diagnoses today include:  1. Laceration of right little finger without foreign body without damage to nail, initial encounter   2. Laceration of right lower extremity, initial encounter     Tests performed today include:  An x-ray of the affected area - does NOT show any broken bones  Vital signs. See below for your results today.   Medications prescribed:   Keflex (cephalexin) - antibiotic  You have been prescribed an antibiotic medicine: take the entire course of medicine even if you are feeling better. Stopping early can cause the antibiotic not to work.  Take any prescribed medications only as directed.  Home care instructions:   Follow any educational materials contained in this packet  Follow R.I.C.E. Protocol:  R - rest your injury   I  - use ice on injury without applying directly to skin  C - compress injury with bandage or splint  E - elevate the injury as much as possible  Follow-up instructions: Call Dr. Levell July office on Monday for a follow-up appointment.  You will need to have the stitches out in approximately 14 days.  Return instructions:   Please return if your fingers are numb or tingling, appear gray or blue, or you have severe pain (also elevate the arm and loosen splint or wrap if you were given one)  Return if you have worsening pain, redness, swelling, pus draining from any of your wounds as this can indicate an infection.  Please return to the Emergency Department if you experience worsening symptoms.   Please return if you have any other emergent concerns.  Additional Information:  Your vital signs today were: BP 103/72 (BP Location: Left Arm)   Pulse 86   Temp 98.7 F (37.1 C) (Oral)   Resp 20   LMP 08/02/2019   SpO2 99%  If your blood pressure (BP) was elevated above 135/85 this visit, please have this repeated by your doctor within one month. --------------
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
## 2019-08-24 NOTE — ED Notes (Signed)
Patient verbalizes understanding of discharge instructions. Opportunity for questioning and answers were provided. Armband removed by staff, pt discharged from ED to home 

## 2019-08-24 NOTE — Progress Notes (Signed)
Orthopedic Tech Progress Note Patient Details:  Erin Ponce 1996-07-01 [id number]  Ortho Devices Type of Ortho Device: Finger splint Ortho Device/Splint Location: RUE Ortho Device/Splint Interventions: Application, Ordered   Post Interventions Patient Tolerated: Well Instructions Provided: Care of device, Adjustment of device   Janit Pagan 08/24/2019, 8:46 PM

## 2019-08-24 NOTE — ED Notes (Signed)
Ortho Tech at Bedside.  
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
## 2019-08-24 NOTE — ED Notes (Signed)
[name] being discharged from the Urgent [facility] and sent to the Emergency Department via wheelchair by staff. Per mani, pa, patient is stable but in need of higher level of care due to ligament injury to right little finger. [name] aware and verbalizes understanding of plan of care.  Vitals:   08/24/19 1820  BP: 127/78  Pulse: (!) 107  Resp: (!) 22  Temp: 99 F (37.2 C)  SpO2: 100%
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
## 2020-04-08 ENCOUNTER — Encounter (HOSPITAL_COMMUNITY): Payer: Self-pay | Admitting: Emergency Medicine

## 2020-04-08 ENCOUNTER — Ambulatory Visit (HOSPITAL_COMMUNITY)
Admission: EM | Admit: 2020-04-08 | Discharge: 2020-04-08 | Disposition: A | Discharge status: Home / Self Care | Payer: Self-pay | Attending: Family Medicine | Admitting: Family Medicine

## 2020-04-08 ENCOUNTER — Other Ambulatory Visit: Payer: Self-pay

## 2020-04-08 DIAGNOSIS — N898 Other specified noninflammatory disorders of vagina: Secondary | ICD-10-CM

## 2020-04-08 NOTE — ED Provider Notes (Signed)
[facility]    CSN: [id number] Arrival date & time: 04/08/20  1140      History   Chief Complaint Chief Complaint  Patient presents with  . Vaginal Bleeding    HPI Erin Ponce is a 24 y.o. female.   [name] a 24 year old female presents today for vaginal  spotting and odor after completing menstrual cycle 2 days ago.  She would like to be tested for STIs.  Describes reddish/brown vaginal discharge with odor.  Denies any associated abdominal pain, back pain, dysuria, hematuria or urine frequency.  No fevers. Patient's last menstrual period was 03/30/2020.  ROS per HPI      Past Medical History:  Diagnosis Date  . Anxiety   . Dislocated knee 2008   R knee  . Miscarriage     Patient Active Problem List   Diagnosis Date Noted  . MDD (major depressive disorder), recurrent, severe, with psychosis (HCC) 03/31/2018  . Severe episode of recurrent major depressive disorder, with psychotic features (HCC)   . Psychosis (HCC) 03/30/2018  . Brief psychotic disorder (HCC) 03/30/2018    Past Surgical History:  Procedure Laterality Date  . NO PAST SURGERIES      OB History    Gravida  1   Para      Term      Preterm      AB      Living        SAB      TAB      Ectopic      Multiple      Live Births               Home Medications    Prior to Admission medications   Medication Sig Start Date End Date Taking? Authorizing Provider  UNABLE TO FIND Apetamin    [provider]  Cetirizine HCl 10 MG CAPS Take 1 capsule (10 mg total) by mouth daily. 07/07/19 04/08/20  Wieters, Junius Creamer, PA-C    Family History Family History  Problem Relation Age of Onset  . Diabetes Mother   . Thyroid disease Mother     Social History Social History   Tobacco Use  . Smoking status: Never Smoker  . Smokeless tobacco: Never Used  Vaping Use  . Vaping Use: Never used  Substance Use Topics  . Alcohol use: No  . Drug use: Yes    Types:  Marijuana    Comment: recently stopped     Allergies   Latex   Review of Systems Review of Systems   Physical Exam Triage Vital Signs ED Triage Vitals  Enc Vitals Group     BP 04/08/20 1217 119/77     Pulse Rate 04/08/20 1217 96     Resp 04/08/20 1217 17     Temp 04/08/20 1217 98.6 F (37 C)     Temp Source 04/08/20 1217 Oral     SpO2 04/08/20 1217 100 %     Weight --      Height --      Head Circumference --      Peak Flow --      Pain Score 04/08/20 1215 0     Pain Loc --      Pain Edu? --      Excl. in GC? --    No data found.  Updated Vital Signs BP 119/77 (BP Location: Left Arm)   Pulse 96   Temp 98.6 F (37 C) (Oral)  Resp 17   LMP 03/30/2020   SpO2 100%   Visual Acuity Right Eye Distance:   Left Eye Distance:   Bilateral Distance:    Right Eye Near:   Left Eye Near:    Bilateral Near:     Physical Exam Vitals and nursing note reviewed.  Constitutional:      General: She is not in acute distress.    Appearance: Normal appearance. She is not ill-appearing, toxic-appearing or diaphoretic.  HENT:     Head: Normocephalic.     Nose: Nose normal.  Eyes:     Conjunctiva/sclera: Conjunctivae normal.  Pulmonary:     Effort: Pulmonary effort is normal.  Musculoskeletal:        General: Normal range of motion.     Cervical back: Normal range of motion.  Skin:    General: Skin is warm and dry.     Findings: No rash.  Neurological:     Mental Status: She is alert.  Psychiatric:        Mood and Affect: Mood normal.      UC Treatments / Results  Labs (all labs ordered are listed, but only abnormal results are displayed) Labs Reviewed  CERVICOVAGINAL ANCILLARY ONLY    EKG   Radiology No results found.  Procedures Procedures (including critical care time)  Medications Ordered in UC Medications - No data to display  Initial Impression / Assessment and Plan / UC Course  I have reviewed the triage vital signs and the nursing  notes.  Pertinent labs & imaging results that were available during my care of the patient were reviewed by me and considered in my medical decision making (see chart for details).     Vaginal odor Swab sent for testing.  Will treat based on results. Follow up as needed for continued or worsening symptoms  Final Clinical Impressions(s) / UC Diagnoses   Final diagnoses:  Vaginal odor     Discharge Instructions     We will call you with any positive results of your swab.  Can also check your MyChart for results.    ED Prescriptions    None     PDMP not reviewed this encounter.   Janace Aris, NP 04/08/20 1249

## 2020-04-08 NOTE — Discharge Instructions (Signed)
We will call you with any positive results of your swab.  Can also check your MyChart for results.

## 2020-04-08 NOTE — ED Triage Notes (Signed)
Pt presents with spotting and odor after completing period 2 days ago. Would like to be tested for STIs

## 2020-04-09 LAB — CERVICOVAGINAL ANCILLARY ONLY
Bacterial Vaginitis (gardnerella): POSITIVE — AB
Candida Glabrata: NEGATIVE
Candida Vaginitis: NEGATIVE
Chlamydia: NEGATIVE
Comment: NEGATIVE
Comment: NEGATIVE
Comment: NEGATIVE
Comment: NEGATIVE
Comment: NEGATIVE
Comment: NORMAL
Neisseria Gonorrhea: NEGATIVE
Trichomonas: NEGATIVE

## 2020-04-10 ENCOUNTER — Telehealth (HOSPITAL_COMMUNITY): Payer: Self-pay

## 2020-05-11 ENCOUNTER — Emergency Department (HOSPITAL_COMMUNITY): Payer: Self-pay

## 2020-05-11 ENCOUNTER — Emergency Department (HOSPITAL_COMMUNITY)
Admission: EM | Admit: 2020-05-11 | Discharge: 2020-05-11 | Disposition: A | Discharge status: Home / Self Care | Payer: Self-pay | Attending: Emergency Medicine | Admitting: Emergency Medicine

## 2020-05-11 ENCOUNTER — Encounter (HOSPITAL_COMMUNITY): Payer: Self-pay | Admitting: *Deleted

## 2020-05-11 DIAGNOSIS — M25562 Pain in left knee: Secondary | ICD-10-CM | POA: Insufficient documentation

## 2020-05-11 DIAGNOSIS — Y998 Other external cause status: Secondary | ICD-10-CM | POA: Insufficient documentation

## 2020-05-11 DIAGNOSIS — Y9389 Activity, other specified: Secondary | ICD-10-CM | POA: Insufficient documentation

## 2020-05-11 DIAGNOSIS — Z91040 Latex allergy status: Secondary | ICD-10-CM | POA: Insufficient documentation

## 2020-05-11 DIAGNOSIS — Y929 Unspecified place or not applicable: Secondary | ICD-10-CM | POA: Insufficient documentation

## 2020-05-11 MED ORDER — ACETAMINOPHEN 325 MG PO TABS: 650.0000 mg | ORAL_TABLET | Freq: Once | ORAL | Status: DC
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
## 2020-05-11 NOTE — Discharge Instructions (Addendum)
You can take 600 mg of ibuprofen every 6 hours, you can take 1000 mg of Tylenol every 6 hours, you can alternate these every 3 or you can take them together.  

## 2020-10-22 ENCOUNTER — Other Ambulatory Visit: Payer: Self-pay

## 2020-10-22 ENCOUNTER — Ambulatory Visit: Payer: Self-pay | Attending: Internal Medicine

## 2020-10-22 DIAGNOSIS — Z23 Encounter for immunization: Secondary | ICD-10-CM

## 2020-10-22 NOTE — Progress Notes (Signed)
   Covid-19 Vaccination Clinic  Name:  Erin Ponce    MRN: [id number] DOB: Jan 31, 1996  10/22/2020  Erin Ponce was observed post Covid-19 immunization for 30 minutes based on pre-vaccination screening without incident. She was provided with Vaccine Information Sheet and instruction to access the V-Safe system.   Erin Ponce was instructed to call 911 with any severe reactions post vaccine: Marland Kitchen Difficulty breathing  . Swelling of face and throat  . A fast heartbeat  . A bad rash all over body  . Dizziness and weakness   Immunizations Administered    Name Date Dose VIS Date Route   PFIZER Comrnaty(Gray TOP) Covid-19 Vaccine 10/22/2020  1:36 PM 0.3 mL 08/13/2020 Intramuscular   Manufacturer: [employer], [employer]   Lot: [id number]   NDC: [phone]

## 2020-10-24 ENCOUNTER — Other Ambulatory Visit: Payer: Self-pay

## 2020-10-24 ENCOUNTER — Emergency Department (EMERGENCY_DEPARTMENT_HOSPITAL)
Admission: EM | Admit: 2020-10-24 | Discharge: 2020-10-27 | Disposition: A | Discharge status: Home / Self Care | Payer: Medicaid Other | Source: Home / Self Care | Attending: Emergency Medicine | Admitting: Emergency Medicine

## 2020-10-24 ENCOUNTER — Encounter (HOSPITAL_COMMUNITY): Payer: Self-pay | Admitting: Emergency Medicine

## 2020-10-24 DIAGNOSIS — Z91040 Latex allergy status: Secondary | ICD-10-CM | POA: Insufficient documentation

## 2020-10-24 DIAGNOSIS — F29 Unspecified psychosis not due to a substance or known physiological condition: Secondary | ICD-10-CM | POA: Insufficient documentation

## 2020-10-24 DIAGNOSIS — F22 Delusional disorders: Secondary | ICD-10-CM | POA: Insufficient documentation

## 2020-10-24 DIAGNOSIS — N939 Abnormal uterine and vaginal bleeding, unspecified: Secondary | ICD-10-CM | POA: Insufficient documentation

## 2020-10-24 DIAGNOSIS — Z20822 Contact with and (suspected) exposure to covid-19: Secondary | ICD-10-CM | POA: Insufficient documentation

## 2020-10-24 DIAGNOSIS — Z046 Encounter for general psychiatric examination, requested by authority: Secondary | ICD-10-CM | POA: Diagnosis present

## 2020-10-24 DIAGNOSIS — F458 Other somatoform disorders: Secondary | ICD-10-CM

## 2020-10-24 LAB — COMPREHENSIVE METABOLIC PANEL
ALT: 11 U/L (ref 0–44)
AST: 17 U/L (ref 15–41)
Albumin: 4.7 g/dL (ref 3.5–5.0)
Alkaline Phosphatase: 46 U/L (ref 38–126)
Anion gap: 10 (ref 5–15)
BUN: 6 mg/dL (ref 6–20)
CO2: 21 mmol/L — ABNORMAL LOW (ref 22–32)
Calcium: 9.6 mg/dL (ref 8.9–10.3)
Chloride: 105 mmol/L (ref 98–111)
Creatinine, Ser: 0.85 mg/dL (ref 0.44–1.00)
GFR, Estimated: >60 mL/min (ref >60)
Glucose, Bld: 92 mg/dL (ref 70–99)
Potassium: 3.8 mmol/L (ref 3.5–5.1)
Sodium: 136 mmol/L (ref 135–145)
Total Bilirubin: 0.6 mg/dL (ref 0.3–1.2)
Total Protein: 7.6 g/dL (ref 6.5–8.1)

## 2020-10-24 LAB — CBC WITH DIFFERENTIAL/PLATELET
Abs Immature Granulocytes: 0.02 10*3/uL (ref 0.00–0.07)
Basophils Absolute: 0 10*3/uL (ref 0.0–0.1)
Basophils Relative: 0 %
Eosinophils Absolute: 0 10*3/uL (ref 0.0–0.5)
Eosinophils Relative: 0 %
HCT: 41.8 % (ref 36.0–46.0)
Hemoglobin: 12.9 g/dL (ref 12.0–15.0)
Immature Granulocytes: 0 %
Lymphocytes Relative: 34 %
Lymphs Abs: 1.9 10*3/uL (ref 0.7–4.0)
MCH: 27.4 pg (ref 26.0–34.0)
MCHC: 30.9 g/dL (ref 30.0–36.0)
MCV: 88.9 fL (ref 80.0–100.0)
Monocytes Absolute: 0.5 10*3/uL (ref 0.1–1.0)
Monocytes Relative: 8 %
Neutro Abs: 3.2 10*3/uL (ref 1.7–7.7)
Neutrophils Relative %: 58 %
Platelets: 307 10*3/uL (ref 150–400)
RBC: 4.7 MIL/uL (ref 3.87–5.11)
RDW: 14.2 % (ref 11.5–15.5)
WBC: 5.6 10*3/uL (ref 4.0–10.5)
nRBC: 0 % (ref 0.0–0.2)

## 2020-10-24 LAB — RAPID URINE DRUG SCREEN, HOSP PERFORMED
Amphetamines: NOT DETECTED
Barbiturates: NOT DETECTED
Benzodiazepines: NOT DETECTED
Cocaine: NOT DETECTED
Opiates: NOT DETECTED
Tetrahydrocannabinol: POSITIVE — AB

## 2020-10-24 LAB — I-STAT BETA HCG BLOOD, ED (MC, WL, AP ONLY): I-stat hCG, quantitative: <5 m[IU]/mL (ref <5)

## 2020-10-24 LAB — URINALYSIS, ROUTINE W REFLEX MICROSCOPIC
Bilirubin Urine: NEGATIVE
Glucose, UA: NEGATIVE mg/dL
Hgb urine dipstick: NEGATIVE
Ketones, ur: 80 mg/dL — AB
Leukocytes,Ua: NEGATIVE
Nitrite: NEGATIVE
Protein, ur: NEGATIVE mg/dL
Specific Gravity, Urine: 1.019 (ref 1.005–1.030)
pH: 6 (ref 5.0–8.0)

## 2020-10-24 LAB — RESP PANEL BY RT-PCR (FLU A&B, COVID) ARPGX2
Influenza A by PCR: NEGATIVE
Influenza B by PCR: NEGATIVE
SARS Coronavirus 2 by RT PCR: NEGATIVE

## 2020-10-24 LAB — SALICYLATE LEVEL: Salicylate Lvl: <7 mg/dL — ABNORMAL LOW (ref 7.0–30.0)

## 2020-10-24 LAB — ETHANOL: Alcohol, Ethyl (B): <10 mg/dL (ref <10)

## 2020-10-24 LAB — ACETAMINOPHEN LEVEL: Acetaminophen (Tylenol), Serum: <10 ug/mL — ABNORMAL LOW (ref 10–30)

## 2020-10-24 MED ORDER — STERILE WATER FOR INJECTION IJ SOLN
INTRAMUSCULAR | Status: AC
  Filled 2020-10-24: qty 10

## 2020-10-24 MED ORDER — OLANZAPINE 10 MG IM SOLR
5.0000 mg | Freq: Once | INTRAMUSCULAR | Status: AC
  Administered 2020-10-24: 5 mg via INTRAMUSCULAR
  Filled 2020-10-24: qty 10

## 2020-10-24 MED ORDER — OLANZAPINE 5 MG PO TABS
2.5000 mg | ORAL_TABLET | Freq: Every day | ORAL | Status: DC
  Administered 2020-10-25 – 2020-10-26 (×2): 2.5 mg via ORAL
  Filled 2020-10-24 (×2): qty 1

## 2020-10-24 MED ORDER — LORAZEPAM 2 MG/ML IJ SOLN
2.0000 mg | Freq: Once | INTRAMUSCULAR | Status: AC
  Administered 2020-10-24: 2 mg via INTRAVENOUS
  Filled 2020-10-24: qty 1

## 2020-10-24 MED ORDER — HYDROXYZINE HCL 25 MG PO TABS
25.0000 mg | ORAL_TABLET | Freq: Three times a day (TID) | ORAL | Status: DC | PRN
  Administered 2020-10-27: 25 mg via ORAL
  Filled 2020-10-24 (×2): qty 1

## 2020-10-24 NOTE — ED Provider Notes (Signed)
[facility] EMERGENCY DEPARTMENT Provider Note   CSN: [id number] Arrival date & time: 10/24/20  1001     History Chief Complaint  Patient presents with  . Abdominal Pain  . Altered Mental Status    Erin Ponce is a 25 y.o. female presenting for evaluation of vaginal bleeding/abdominal pain.  Level 5 caveat due to psychiatric disorder. Pt would only talk to me if she could also write down what she was saying.   Patient presenting with multiple different complaints depending on when asked.  Patient told triage nurse she is having vaginal bleeding is concerned she is pregnant.  Patient tells me she is feeling her stomach moving and knows she is pregnant.  She also tells me "he" thinks I am  30 but I am only 25.  I will get the vaccine when I am 30."  She denies fevers, chills, chest pain, nausea, vomiting.  She will not tell me if she is on any medications.  She states she has had a positive pregnancy test, but that was 2 years ago.  Additional history obtained from chart review. Pt with h/o anxiety, MDD, psychosis.   HPI     Past Medical History:  Diagnosis Date  . Anxiety   . Dislocated knee 2008   R knee  . Miscarriage     Patient Active Problem List   Diagnosis Date Noted  . MDD (major depressive disorder), recurrent, severe, with psychosis (HCC) 03/31/2018  . Severe episode of recurrent major depressive disorder, with psychotic features (HCC)   . Psychosis (HCC) 03/30/2018  . Brief psychotic disorder (HCC) 03/30/2018    Past Surgical History:  Procedure Laterality Date  . NO PAST SURGERIES       OB History    Gravida  1   Para      Term      Preterm      AB      Living        SAB      IAB      Ectopic      Multiple      Live Births              Family History  Problem Relation Age of Onset  . Diabetes Mother   . Thyroid disease Mother     Social History   Tobacco Use  . Smoking status: Never Smoker  .  Smokeless tobacco: Never Used  Vaping Use  . Vaping Use: Never used  Substance Use Topics  . Alcohol use: No  . Drug use: Yes    Types: Marijuana    Comment: recently stopped    Home Medications Prior to Admission medications   Medication Sig Start Date End Date Taking? Authorizing Provider  UNABLE TO FIND Apetamin    [provider]  Cetirizine HCl 10 MG CAPS Take 1 capsule (10 mg total) by mouth daily. 07/07/19 04/08/20  Wieters, Hallie C, PA-C    Allergies    Latex  Review of Systems   Review of Systems  Unable to perform ROS: Psychiatric disorder  Genitourinary: Positive for vaginal bleeding.    Physical Exam Updated Vital Signs BP (!) 153/103   Pulse (!) 103   Resp 13   SpO2 100%   Physical Exam Vitals and nursing note reviewed.  Constitutional:      General: She is not in acute distress.    Appearance: She is well-developed and well-nourished.     Comments: appears  nontoxic  HENT:     Head: Normocephalic and atraumatic.  Eyes:     Extraocular Movements: EOM normal.     Conjunctiva/sclera: Conjunctivae normal.     Pupils: Pupils are equal, round, and reactive to light.  Cardiovascular:     Rate and Rhythm: Normal rate and regular rhythm.     Pulses: Intact distal pulses.  Pulmonary:     Effort: Pulmonary effort is normal. No respiratory distress.     Breath sounds: Normal breath sounds. No wheezing.  Abdominal:     General: There is no distension.     Palpations: Abdomen is soft. There is no mass.     Tenderness: There is no abdominal tenderness. There is no guarding or rebound.     Comments: No ttp  Musculoskeletal:        General: Normal range of motion.     Cervical back: Normal range of motion and neck supple.  Skin:    General: Skin is warm and dry.     Capillary Refill: Capillary refill takes less than 2 seconds.  Neurological:     Mental Status: She is alert and oriented to person, place, and time.  Psychiatric:        Attention and  Perception: She does not perceive auditory or visual hallucinations.        Mood and Affect: Mood is anxious.        Speech: Speech is rapid and pressured and tangential.        Thought Content: Thought content does not include homicidal or suicidal ideation. Thought content does not include homicidal or suicidal plan.     Comments: Rapid and pressured speech.  Tangential thoughts.  Hyperactive behavior. Denies SI, HI, AVH. Will only talk to me if she can also write down what she is saying. But what she is writing is nonsensical.      ED Results / Procedures / Treatments   Labs (all labs ordered are listed, but only abnormal results are displayed) Labs Reviewed  COMPREHENSIVE METABOLIC PANEL - Abnormal; Notable for the following components:      Result Value   CO2 21 (*)    All other components within normal limits  SALICYLATE LEVEL - Abnormal; Notable for the following components:   Salicylate Lvl <7.0 (*)    All other components within normal limits  ACETAMINOPHEN LEVEL - Abnormal; Notable for the following components:   Acetaminophen (Tylenol), Serum <10 (*)    All other components within normal limits  RESP PANEL BY RT-PCR (FLU A&B, COVID) ARPGX2  CBC WITH DIFFERENTIAL/PLATELET  ETHANOL  URINALYSIS, ROUTINE W REFLEX MICROSCOPIC  RAPID URINE DRUG SCREEN, HOSP PERFORMED  I-STAT BETA HCG BLOOD, ED ([facility], [facility], [facility] ONLY)    EKG None  Radiology No results found.  Procedures Procedures   Medications Ordered in ED Medications - No data to display  ED Course  I have reviewed the triage vital signs and the nursing notes.  Pertinent labs & imaging results that were available during my care of the patient were reviewed by me and considered in my medical decision making (see chart for details).    MDM Rules/Calculators/A&P                          Patient presenting for concerns for pregnancy and vaginal bleeding.  On exam, patient is nontoxic.  She has no abdominal tenderness  on my exam.  She is mildly tachycardic, but also  appears anxious.  However will obtain labs to ensure no significant anemia.  Will obtain pregnancy test and urine.  However based on patient's rambling speech, her inability to talk to me without also writing down nonsensical words, and her tangential thoughts, her symptoms today are likely due to a psychiatric condition.  Labs interpreted by me, overall reassuring.  Hemoglobin normal at 12.9.  Pregnancy is negative.  Heart rate improved without intervention.  At this time, patient is medically clear for psychiatric evaluation for delusions/psychosis.  I discussed findings with patient.  I discussed that she will be evaluated by the behavioral health team. She is not IVCd, but if pt tries to leave she will need to be IVCd.   The patient has been placed in psychiatric observation due to the need to provide a safe environment for the patient while obtaining psychiatric consultation and evaluation, as well as ongoing medical and medication management to treat the patient's condition.  The patient has not been placed under full IVC at this time.  Final Clinical Impression(s) / ED Diagnoses Final diagnoses:  Delusion of pregnancy    Rx / DC Orders ED Discharge Orders    None       Alveria Apley, PA-C 10/24/20 1511    Cheryll Cockayne, MD 10/25/20 [phone]

## 2020-10-24 NOTE — Care Management (Signed)
Rachel Moulds, LCSWA notified Writer that Per [name], NP; [name] meets inpatient criteria. Writer faxed referrals to the following facilities:   [facility] Details  Fax          [address] [place] [place] [address]     Internal comment    [facility] Details  Fax        [phone]. [address], [place] [address]     Internal comment    [facility] Details  Fax        [address] [place] [address]     Internal comment    [facility] Details  Fax        [address] [place] [address]     Internal comment    [facility] Details  Fax        [address] [place] [address]     Internal comment    [facility] Details  Fax        [address] [place] [address]     Internal comment

## 2020-10-24 NOTE — ED Notes (Signed)
PA Caccavale made aware of HR 103 and B/P 153/103

## 2020-10-24 NOTE — ED Notes (Signed)
Reminded pt that we still need to collect a UA, pt states that they are not able to provide a sample at this time.

## 2020-10-24 NOTE — ED Triage Notes (Signed)
Pt. Refused for a female to be in the room. Pt. Stated, My stomach hurts and Im having PTSD. I started being confused yesterday. Ive been bleeding for 3 months but I might be pregnant.  Pt. Rambling about her father and her ex and her child and being able to see him/her.

## 2020-10-24 NOTE — ED Triage Notes (Signed)
Pt. Stated, I think Im having separation anxiety" maybe from my family.
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
## 2020-10-24 NOTE — ED Notes (Signed)
PT TTS 
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
## 2020-10-25 ENCOUNTER — Encounter (HOSPITAL_COMMUNITY): Payer: Self-pay | Admitting: Registered Nurse

## 2020-10-25 MED ORDER — LORAZEPAM 1 MG PO TABS
2.0000 mg | ORAL_TABLET | Freq: Once | ORAL | Status: AC
  Administered 2020-10-25: 2 mg via ORAL
  Filled 2020-10-25: qty 2

## 2020-10-25 MED ORDER — LORAZEPAM 2 MG/ML IJ SOLN: 2.0000 mg | Freq: Once | INTRAMUSCULAR | Status: DC

## 2020-10-25 MED ORDER — MELATONIN 3 MG PO TABS
3.0000 mg | ORAL_TABLET | Freq: Every day | ORAL | Status: DC
  Administered 2020-10-25 – 2020-10-26 (×2): 3 mg via ORAL
  Filled 2020-10-25 (×4): qty 1

## 2020-10-25 MED ORDER — OLANZAPINE 10 MG IM SOLR
5.0000 mg | Freq: Once | INTRAMUSCULAR | Status: DC | PRN
  Filled 2020-10-25: qty 10

## 2020-10-25 NOTE — BHH Counselor (Signed)
TTS Assessment:   TTS re-assessed patient an expressed she wants to home due to having a daughter. She stated she came to the hospital because she thought she was pregnant and she still thinks she's pregnant. Patient report her mother put her out of the home September 08, 2020 and she went to live with her boyfriend. She discussed she feels that child's father is following her while she does [employer]. 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 Patient stated if she stays in the hospital she will get  Cabin fever. She discussed she does not want the people who are caring for her to have control of her body, "I want to be in charged of my care plan. I want outpatient therapy. Being in the hospital is not a good thing for me."   Patient speaking in word salad.  She's alert and oriented. She continued to speak not allowing TTS to ask her questions. When TTS started speaking patient would cut TTS off stating I was not finished speaking. Continue to present with an altered mental status.    Disposition: S. Rankin, NP, continue to recommend inpatient tx

## 2020-10-25 NOTE — Consult Note (Signed)
  Attempted to do psychiatric consult via  tele health assessment but machine is not working.  Chart review.  Patient continues to need inpatient psychiatric treatment.  Recommendations:  Medication:  Start Zyprexa Zydis 5 mg Q `hs  Disposition:  Inpatient psychiatric treatment

## 2020-10-25 NOTE — ED Provider Notes (Signed)
I was called to this patient's bedside due to nursing concern.  Patient was becoming agitated and pacing in the hallways.  She was asked not to but demanded to speak to Dr.  When I asked her what her concerns were, she states she did not want to be seen or treated by [place] anymore.  She states she would like to be discharged immediately and be given back her belongings.  I reviewed the medical record chart thus far and it appears that she does meet inpatient criteria for mental health concerns.  IVC paperwork has been filled out and we are awaiting disposition by behavioral team.  [name] offered oral medication to help her sleep as one of her concerns that she would like to rest.  I offered her this and she agreed.  I also have intramuscular injections for acute education ordered as needed.  Nursing team understands this will offer oral medications.  And will contact me if the patient needs intramuscular injection for agitation.   Sabino Donovan, MD 10/25/20 [phone]

## 2020-10-25 NOTE — ED Notes (Signed)
Breakfast ordered 

## 2020-10-25 NOTE — ED Notes (Signed)
Service Resource called @ 1803-Dinner Tray Ordered. 

## 2020-10-25 NOTE — ED Notes (Signed)
Pt up to nurses desk asking questions about why she is here. Pt getting agitated with staff. Pt asking to speak to MD about what is going on with her care. Dr. Myrtis Ser at bedside

## 2020-10-25 NOTE — ED Notes (Signed)
Lunch Tray Ordered @ 1016. 

## 2020-10-25 NOTE — BH Assessment (Signed)
TTS attempted to see patient for re-assessment. Unable to see at this time due his nurse dealing with an emergency with another patient. TTS will be notified when able to re-assess patient.

## 2020-10-25 NOTE — ED Notes (Signed)
Pt's belongings placed in locker 12. 

## 2020-10-26 DIAGNOSIS — F333 Major depressive disorder, recurrent, severe with psychotic symptoms: Secondary | ICD-10-CM

## 2020-10-26 LAB — RESP PANEL BY RT-PCR (FLU A&B, COVID) ARPGX2
Influenza A by PCR: NEGATIVE
Influenza B by PCR: NEGATIVE
SARS Coronavirus 2 by RT PCR: NEGATIVE

## 2020-10-26 MED ORDER — OLANZAPINE 5 MG PO TABS
5.0000 mg | ORAL_TABLET | Freq: Every day | ORAL | Status: DC
  Administered 2020-10-27: 5 mg via ORAL
  Filled 2020-10-26: qty 1

## 2020-10-26 NOTE — ED Provider Notes (Signed)
Emergency Medicine Observation Re-evaluation Note  Erin Ponce is a 25 y.o. female, seen on rounds today.  Pt initially presented to the ED for complaints of Abdominal Pain and Altered Mental Status Currently, the patient is sitting in a chair outside of room; cooperative.  Physical Exam  BP (!) 128/92 (BP Location: Left Arm)   Pulse (!) 107   Temp 98.4 F (36.9 C) (Oral)   Resp 18   Ht 5\' 2"  (1.575 m)   Wt 54.4 kg   SpO2 100%   BMI 21.95 kg/m  Physical Exam General: Alert  Cardiac: Normal rate and rhythm Lungs: No distress Psych: Cooperative  ED Course / MDM  EKG:    I have reviewed the labs performed to date as well as medications administered while in observation.  Recent changes in the last 24 hours include BH recommending zyprexa 5 mg QHS.  Plan  Current plan is for inpatient treatment. [name] under full IVC at this time.   , PA-C 10/26/20 10/28/20    2353, MD 10/28/20 [phone]

## 2020-10-26 NOTE — ED Notes (Signed)
Pt using phone. 1 of 2 allowed. Pt notified about 23 phone/day rule. Pt verbalized understanding of rule.

## 2020-10-26 NOTE — ED Notes (Addendum)
Patient refused to eat Lunch, Because she stated she don't eat meat. She didn't like anything else that was on her tray. I asked her would she like for me to order her anything else she said No, not to order her nothing.

## 2020-10-26 NOTE — ED Notes (Signed)
Pt mother called for report and update on pt. This RN asked pt for permission to give information to mother. Pt told this RN not to tell her mother anything and that she prefers to speak with her. This RN explained to mother circumstances and that information without pt consent can be provided.  Pt mother very upset and said she did not want to speak to pt because pt would not be able to tell her anything . Mother said have a good day and hung on on RN

## 2020-10-26 NOTE — ED Notes (Signed)
Pt insists she has either ectopic pregnancy or appendicitis. This RN assessed pt stomach and pt did not state anything hurt when palpated.  Pt did not want to answer any questions stating " I do not trust you and you will write down if I dont take the medication" This RN did explain she is able to refuse medication and that what ever transpires would be charted.   Pt denies SI / HI at this time.

## 2020-10-26 NOTE — ED Notes (Signed)
Pt requesting speaking with RN. Pt states she is fearful this is the place where she had her miscarriage. Pt also states she is worried her Marjo Bicker father is planning on taking the child to [place] or the [place].

## 2020-10-26 NOTE — ED Notes (Signed)
Placement called this RN is needing to recollected a Covid Swab because last swab was 48hrs ago so that pt can be placed in a facility.

## 2020-10-26 NOTE — ED Notes (Signed)
Pt states we are now allowed to give information to pt mother.

## 2020-10-26 NOTE — ED Notes (Signed)
Pt came to ask RN what shots were given in her legs last night. Then pt asked for her cell phone which was explained she could not have d/t the rules . Pt did not like the answer and stated she would not speak to anyone until she was discharged and will be refusing all future medications.
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
## 2020-10-26 NOTE — ED Notes (Signed)
Pt will not stay in room and is wanting to walk in the hallways. This RN explained to pt as to why she must stay in room. Pt continues to change topics and asking multiple questions ignoring this RN instructions to please go back in room. Pt stated "This is not a prison" and is continuing to walk in the hallway.

## 2020-10-26 NOTE — Care Management (Signed)
Per [facility], Erin Ponce the patient is under review at [facility] pending a repeat covid antigen because her last PCR was over 48 hours ago.    Writer informed the ER RN working with the patient.

## 2020-10-26 NOTE — ED Notes (Signed)
Pt resting. Even Resp. No distress noted at this time. Meal tray Given. Sitter at bedside. Will continue to monitor.

## 2020-10-26 NOTE — ED Notes (Signed)
Pt making second and final phone of the day. Pt verbalized understanding this will be her last phone call.

## 2020-10-26 NOTE — ED Notes (Signed)
Patient refused a Snack and Drink. 
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
## 2020-10-27 ENCOUNTER — Other Ambulatory Visit: Payer: Self-pay | Admitting: Psychiatric/Mental Health

## 2020-10-27 ENCOUNTER — Inpatient Hospital Stay (HOSPITAL_COMMUNITY)
Admission: AD | Admit: 2020-10-27 | Discharge: 2020-10-31 | DRG: 885 | Disposition: A | Discharge status: Home / Self Care | Payer: Medicaid Other | Source: Intra-hospital | Attending: Psychiatry | Admitting: Psychiatry

## 2020-10-27 ENCOUNTER — Encounter (HOSPITAL_COMMUNITY): Payer: Self-pay | Admitting: Psychiatry

## 2020-10-27 ENCOUNTER — Other Ambulatory Visit: Payer: Self-pay

## 2020-10-27 DIAGNOSIS — F29 Unspecified psychosis not due to a substance or known physiological condition: Secondary | ICD-10-CM | POA: Diagnosis present

## 2020-10-27 DIAGNOSIS — F4310 Post-traumatic stress disorder, unspecified: Secondary | ICD-10-CM | POA: Diagnosis present

## 2020-10-27 DIAGNOSIS — Z91040 Latex allergy status: Secondary | ICD-10-CM | POA: Diagnosis not present

## 2020-10-27 DIAGNOSIS — F419 Anxiety disorder, unspecified: Secondary | ICD-10-CM | POA: Diagnosis present

## 2020-10-27 DIAGNOSIS — G4700 Insomnia, unspecified: Secondary | ICD-10-CM | POA: Diagnosis present

## 2020-10-27 DIAGNOSIS — F333 Major depressive disorder, recurrent, severe with psychotic symptoms: Principal | ICD-10-CM | POA: Diagnosis present

## 2020-10-27 DIAGNOSIS — Z20822 Contact with and (suspected) exposure to covid-19: Secondary | ICD-10-CM | POA: Diagnosis present

## 2020-10-27 DIAGNOSIS — Z9114 Patient's other noncompliance with medication regimen: Secondary | ICD-10-CM | POA: Diagnosis not present

## 2020-10-27 DIAGNOSIS — R000 Tachycardia, unspecified: Secondary | ICD-10-CM | POA: Diagnosis present

## 2020-10-27 MED ORDER — ALUM & MAG HYDROXIDE-SIMETH 200-200-20 MG/5ML PO SUSP: 30.0000 mL | ORAL | Status: DC | PRN

## 2020-10-27 MED ORDER — ACETAMINOPHEN 325 MG PO TABS
650.0000 mg | ORAL_TABLET | Freq: Four times a day (QID) | ORAL | Status: DC | PRN
  Administered 2020-10-27: 650 mg via ORAL
  Filled 2020-10-27: qty 2

## 2020-10-27 MED ORDER — HYDROXYZINE HCL 25 MG PO TABS
25.0000 mg | ORAL_TABLET | Freq: Three times a day (TID) | ORAL | Status: DC | PRN
Start: 2020-10-27 — End: 2020-10-27

## 2020-10-27 MED ORDER — ALUM & MAG HYDROXIDE-SIMETH 200-200-20 MG/5ML PO SUSP
30.0000 mL | ORAL | Status: DC | PRN
Start: 2020-10-27 — End: 2020-10-27

## 2020-10-27 MED ORDER — HYDROXYZINE HCL 25 MG PO TABS
25.0000 mg | ORAL_TABLET | Freq: Four times a day (QID) | ORAL | Status: DC | PRN
Start: 2020-10-27 — End: 2020-10-27

## 2020-10-27 MED ORDER — THIAMINE HCL 100 MG PO TABS: 100.0000 mg | ORAL_TABLET | Freq: Every day | ORAL | Status: DC

## 2020-10-27 MED ORDER — RISPERIDONE 2 MG PO TBDP: 2.0000 mg | ORAL_TABLET | Freq: Three times a day (TID) | ORAL | Status: DC | PRN

## 2020-10-27 MED ORDER — RISPERIDONE 2 MG PO TBDP
2.0000 mg | ORAL_TABLET | Freq: Two times a day (BID) | ORAL | Status: DC
  Administered 2020-10-27: 2 mg via ORAL
  Filled 2020-10-27 (×3): qty 1

## 2020-10-27 MED ORDER — OLANZAPINE 5 MG PO TBDP
5.0000 mg | ORAL_TABLET | Freq: Three times a day (TID) | ORAL | Status: DC | PRN
Start: 2020-10-27 — End: 2020-10-27

## 2020-10-27 MED ORDER — MAGNESIUM HYDROXIDE 400 MG/5ML PO SUSP
30.0000 mL | Freq: Every day | ORAL | Status: DC | PRN
Start: 2020-10-27 — End: 2020-10-27
  Filled 2020-10-27: qty 30

## 2020-10-27 MED ORDER — ENSURE ENLIVE PO LIQD
237.0000 mL | Freq: Two times a day (BID) | ORAL | Status: DC
  Administered 2020-10-28 – 2020-10-31 (×6): 237 mL via ORAL
  Filled 2020-10-27 (×9): qty 237

## 2020-10-27 MED ORDER — TRAZODONE HCL 50 MG PO TABS
50.0000 mg | ORAL_TABLET | Freq: Every evening | ORAL | Status: DC | PRN
  Administered 2020-10-27 – 2020-10-28 (×2): 50 mg via ORAL
  Filled 2020-10-27 (×2): qty 1

## 2020-10-27 MED ORDER — LORAZEPAM 1 MG PO TABS
1.0000 mg | ORAL_TABLET | Freq: Four times a day (QID) | ORAL | Status: DC | PRN
Start: 2020-10-27 — End: 2020-10-27

## 2020-10-27 MED ORDER — MELATONIN 3 MG PO TABS: 3.0000 mg | ORAL_TABLET | Freq: Every day | ORAL | Status: DC

## 2020-10-27 MED ORDER — ONDANSETRON 4 MG PO TBDP: 4.0000 mg | ORAL_TABLET | Freq: Four times a day (QID) | ORAL | Status: DC | PRN

## 2020-10-27 MED ORDER — OLANZAPINE 5 MG PO TABS: 5.0000 mg | ORAL_TABLET | Freq: Every day | ORAL | Status: DC

## 2020-10-27 MED ORDER — MAGNESIUM HYDROXIDE 400 MG/5ML PO SUSP: 30.0000 mL | Freq: Every day | ORAL | Status: DC | PRN

## 2020-10-27 MED ORDER — ZIPRASIDONE MESYLATE 20 MG IM SOLR
20.0000 mg | Freq: Four times a day (QID) | INTRAMUSCULAR | Status: DC | PRN
Start: 2020-10-27 — End: 2020-10-31

## 2020-10-27 MED ORDER — LORAZEPAM 1 MG PO TABS: 1.0000 mg | ORAL_TABLET | ORAL | Status: DC | PRN

## 2020-10-27 MED ORDER — LORAZEPAM 1 MG PO TABS
1.0000 mg | ORAL_TABLET | Freq: Four times a day (QID) | ORAL | Status: DC | PRN
Start: 2020-10-27 — End: 2020-10-31

## 2020-10-27 MED ORDER — ZIPRASIDONE MESYLATE 20 MG IM SOLR: 20.0000 mg | INTRAMUSCULAR | Status: DC | PRN

## 2020-10-27 MED ORDER — HYDROXYZINE HCL 25 MG PO TABS
25.0000 mg | ORAL_TABLET | Freq: Three times a day (TID) | ORAL | Status: DC | PRN
  Administered 2020-10-27 – 2020-10-28 (×3): 25 mg via ORAL
  Filled 2020-10-27 (×3): qty 1

## 2020-10-27 MED ORDER — ADULT MULTIVITAMIN W/MINERALS CH
1.0000 | ORAL_TABLET | Freq: Every day | ORAL | Status: DC
Start: 2020-10-27 — End: 2020-10-27
  Administered 2020-10-27: 1 via ORAL
  Filled 2020-10-27: qty 1

## 2020-10-27 MED ORDER — ACETAMINOPHEN 325 MG PO TABS: 650.0000 mg | ORAL_TABLET | Freq: Four times a day (QID) | ORAL | Status: DC | PRN

## 2020-10-27 MED ORDER — LOPERAMIDE HCL 2 MG PO CAPS: 2.0000 mg | ORAL_CAPSULE | ORAL | Status: DC | PRN

## 2020-10-27 MED ORDER — TRAZODONE HCL 50 MG PO TABS
50.0000 mg | ORAL_TABLET | Freq: Every evening | ORAL | Status: DC | PRN
Start: 2020-10-27 — End: 2020-10-27

## 2020-10-27 NOTE — ED Notes (Signed)
Pt transported to [facility] by [employer]. All pt's belongings given to [facility]. Pt discharged/ transferred to [facility] in [facility] company w/ no incident.

## 2020-10-27 NOTE — Progress Notes (Signed)
Pt delusional this evening, pt stated she felt something moving in her stomach " a baby" , pt informed she was not pregnant at this time. Pt continues to be paranoid on the unit this evening.      10/27/20 2000  Psych Admission Type (Psych Patients Only)  Admission Status Involuntary  Psychosocial Assessment  Patient Complaints Worrying;Suspiciousness  Eye Contact Fair  Facial Expression Anxious;Worried  Affect Preoccupied  Chartered loss adjuster Cautious  Motor Activity Slow  Appearance/Hygiene Disheveled  Behavior Characteristics Anxious  Mood Suspicious  Aggressive Behavior  Effect No apparent injury  Thought Chartered certified accountant of ideas;Tangential  Content Blaming others;Blaming self;Preoccupation  Delusions Somatic;Paranoid  Perception WDL  Hallucination Tactile  Judgment Impaired  Confusion WDL  Danger to Self  Current suicidal ideation? Denies  Danger to Others  Danger to Others None reported or observed

## 2020-10-27 NOTE — ED Notes (Signed)
[employer] called for transport to [facility] 

## 2020-10-27 NOTE — Progress Notes (Signed)
Adult Psychoeducational Group Note  Date:  10/27/2020 Time:  11:53 PM  Group Topic/Focus:  Wrap-Up Group:   The focus of this group is to help patients review their daily goal of treatment and discuss progress on daily workbooks.  Participation Level:  Active  Participation Quality:  Appropriate  Affect:  Anxious  Cognitive:  Disorganized and Confused  Insight: Limited  Engagement in Group:  Limited  Modes of Intervention:  Discussion  Additional Comments:  Pt is a new admission. Pt stated her goal for today was to focus on her treatment plan. Pt stated she accomplished her goal today. Pt stated she did not get a chance to talk with her doctor and social worker about her care today. Pt rated her overall day an 7 out of 10. Pt stated she was able to contact her mother and daughter letting them know she was ok, help improved her day. Pt stated she felt better about himself today because she feels she will get the help she needs here at [facility]. Pt stated her relationship with her family and support system needs to be improved. Pt stated she was able to attend all meals these afternoon. Pt stated she took all medications provided today. Pt stated her appetite was poor today. Pt rated her sleep last night as poor. Pt stated the goal for tonight was to get some rest. Pt stated he was some physical pain tonight.Pt stated the left side of her back was in severe pain. Pt rated the back pain a 10 on the pain level scale. Pt nurse was updated on situation. Pt deny auditory or visual hallucinations. Pt denies thoughts of harming herself or others. Pt stated she would alert staff if anything changes.   Felipa Furnace 10/27/2020, 11:53 PM

## 2020-10-27 NOTE — Progress Notes (Signed)
Pt accepted to [facility], bed 502-2     [name], NP is the accepting provider.    Dr. Jola Babinski is the attending provider.    Call report to [phone]    Emilee @ [facility] ED notified.     Pt is involuntary and will be transported by law enforcement  Pt is scheduled to arrive at [facility] at 3pm.   Wells Guiles, MSW, LCSW, LCAS Clinical Social Worker II Disposition CSW [phone]
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
## 2020-10-27 NOTE — Tx Team (Signed)
Initial Treatment Plan 10/27/2020 4:48 PM [name] SHU:[id number]    PATIENT STRESSORS: Health problems Marital or family conflict Substance abuse   PATIENT STRENGTHS: Communication skills Physical Health Supportive family/friends   PATIENT IDENTIFIED PROBLEMS: Anxiety   depression  confusion  Safety at home  Child care             DISCHARGE CRITERIA:  Ability to meet basic life and health needs Adequate post-discharge living arrangements Improved stabilization in mood, thinking, and/or behavior  PRELIMINARY DISCHARGE PLAN: Outpatient therapy Participate in family therapy Placement in alternative living arrangements  PATIENT/FAMILY INVOLVEMENT: This treatment plan has been presented to and reviewed with the patient, Erin Ponce . The patient has been given the opportunity to ask questions and make suggestions.  Wardell Heath, RN 10/27/2020, 4:48 PM

## 2020-10-27 NOTE — ED Notes (Signed)
Report called to Elizabeth RN at [facility]. 

## 2020-10-27 NOTE — ED Notes (Signed)
PT DOES NOT EAT PORK!!!!

## 2020-10-27 NOTE — Progress Notes (Signed)
Pt up to the nursing station stating she can not sleep in the room with the roommate. Pt very paranoid. Pt offered the quiet room, but pt too paranoid to sleep in there. Pt encouraged to relax and try to get some sleep since she just took a Trazodone peer MAR

## 2020-10-27 NOTE — ED Notes (Signed)
Pt restless all day. Finally sleeping at this time. Will obtain vs once awake.

## 2020-10-28 DIAGNOSIS — F29 Unspecified psychosis not due to a substance or known physiological condition: Secondary | ICD-10-CM

## 2020-10-28 LAB — LIPID PANEL
Cholesterol: 122 mg/dL (ref 0–200)
HDL: 38 mg/dL — ABNORMAL LOW (ref >40)
LDL Cholesterol: 72 mg/dL (ref 0–99)
Total CHOL/HDL Ratio: 3.2 RATIO
Triglycerides: 60 mg/dL (ref <150)
VLDL: 12 mg/dL (ref 0–40)

## 2020-10-28 LAB — HEMOGLOBIN A1C
Hgb A1c MFr Bld: 5.7 % — ABNORMAL HIGH (ref 4.8–5.6)
Mean Plasma Glucose: 116.89 mg/dL

## 2020-10-28 LAB — TSH: TSH: 0.935 u[IU]/mL (ref 0.350–4.500)

## 2020-10-28 MED ORDER — RISPERIDONE 2 MG PO TBDP
2.0000 mg | ORAL_TABLET | Freq: Every day | ORAL | Status: DC
  Administered 2020-10-28 – 2020-10-29 (×2): 2 mg via ORAL
  Filled 2020-10-28 (×4): qty 1

## 2020-10-28 MED ORDER — TRAZODONE HCL 50 MG PO TABS
50.0000 mg | ORAL_TABLET | Freq: Once | ORAL | Status: DC
  Filled 2020-10-28: qty 1

## 2020-10-28 MED ORDER — RISPERIDONE 1 MG PO TBDP
3.0000 mg | ORAL_TABLET | Freq: Every day | ORAL | Status: DC
  Administered 2020-10-28: 3 mg via ORAL
  Filled 2020-10-28 (×2): qty 3

## 2020-10-28 NOTE — Tx Team (Signed)
Interdisciplinary Treatment and Diagnostic Plan Update  10/28/2020 Time of Session: 9:25am Erin Ponce MRN: [id number]  Principal Diagnosis: <principal problem not specified>  Secondary Diagnoses: Active Problems:   Psychosis ([place])   Current Medications:  Current Facility-Administered Medications  Medication Dose Route Frequency Provider Last Rate Last Admin  . acetaminophen (TYLENOL) tablet 650 mg  650 mg Oral Q6H PRN Sharma Covert, MD   650 mg at 10/27/20 2054  . alum & mag hydroxide-simeth (MAALOX/MYLANTA) 200-200-20 MG/5ML suspension 30 mL  30 mL Oral Q4H PRN Sharma Covert, MD      . feeding supplement (ENSURE ENLIVE / ENSURE PLUS) liquid 237 mL  237 mL Oral BID BM Lindon Romp A, NP      . hydrOXYzine (ATARAX/VISTARIL) tablet 25 mg  25 mg Oral TID PRN Sharma Covert, MD   25 mg at 10/28/20 9163  . risperiDONE (RISPERDAL M-TABS) disintegrating tablet 2 mg  2 mg Oral Q8H PRN Sharma Covert, MD       And  . LORazepam (ATIVAN) tablet 1 mg  1 mg Oral Q6H PRN Sharma Covert, MD       And  . ziprasidone (GEODON) injection 20 mg  20 mg Intramuscular Q6H PRN Sharma Covert, MD      . magnesium hydroxide (MILK OF MAGNESIA) suspension 30 mL  30 mL Oral Daily PRN Sharma Covert, MD      . risperiDONE (RISPERDAL M-TABS) disintegrating tablet 2 mg  2 mg Oral Daily Sharma Covert, MD   2 mg at 10/28/20 8466  . risperiDONE (RISPERDAL M-TABS) disintegrating tablet 3 mg  3 mg Oral QHS Sharma Covert, MD      . traZODone (DESYREL) tablet 50 mg  50 mg Oral QHS PRN Sharma Covert, MD   50 mg at 10/27/20 2252   PTA Medications: No medications prior to admission.    Patient Stressors: Health problems Marital or family conflict Substance abuse  Patient Strengths: Armed forces logistics/support/administrative officer Physical Health Supportive family/friends  Treatment Modalities: Medication Management, Group therapy, Case management,  1 to 1 session with clinician, Psychoeducation,  Recreational therapy.   Physician Treatment Plan for Primary Diagnosis: <principal problem not specified> Long Term Goal(s):     Short Term Goals:    Medication Management: Evaluate patient's response, side effects, and tolerance of medication regimen.  Therapeutic Interventions: 1 to 1 sessions, Unit Group sessions and Medication administration.  Evaluation of Outcomes: Not Met  Physician Treatment Plan for Secondary Diagnosis: Active Problems:   Psychosis ([place])  Long Term Goal(s):     Short Term Goals:       Medication Management: Evaluate patient's response, side effects, and tolerance of medication regimen.  Therapeutic Interventions: 1 to 1 sessions, Unit Group sessions and Medication administration.  Evaluation of Outcomes: Not Met   RN Treatment Plan for Primary Diagnosis: <principal problem not specified> Long Term Goal(s): Knowledge of disease and therapeutic regimen to maintain health will improve  Short Term Goals: Ability to remain free from injury will improve, Ability to verbalize frustration and anger appropriately will improve, Ability to identify and develop effective coping behaviors will improve and Compliance with prescribed medications will improve  Medication Management: RN will administer medications as ordered by provider, will assess and evaluate patient's response and provide education to patient for prescribed medication. RN will report any adverse and/or side effects to prescribing provider.  Therapeutic Interventions: 1 on 1 counseling sessions, Psychoeducation, Medication administration, Evaluate responses to treatment, Monitor  vital signs and CBGs as ordered, Perform/monitor CIWA, COWS, AIMS and Fall Risk screenings as ordered, Perform wound care treatments as ordered.  Evaluation of Outcomes: Not Met   LCSW Treatment Plan for Primary Diagnosis: <principal problem not specified> Long Term Goal(s): Safe transition to appropriate next level of  care at discharge, Engage patient in therapeutic group addressing interpersonal concerns.  Short Term Goals: Engage patient in aftercare planning with referrals and resources, Increase social support, Increase ability to appropriately verbalize feelings, Identify triggers associated with mental health/substance abuse issues and Increase skills for wellness and recovery  Therapeutic Interventions: Assess for all discharge needs, 1 to 1 time with Social worker, Explore available resources and support systems, Assess for adequacy in community support network, Educate family and significant other(s) on suicide prevention, Complete Psychosocial Assessment, Interpersonal group therapy.  Evaluation of Outcomes: Not Met   Progress in Treatment: Attending groups: No. Participating in groups: No. Taking medication as prescribed: Yes. Toleration medication: Yes. Family/Significant other contact made: No, will contact:  if consent is given Patient understands diagnosis: Yes. Discussing patient identified problems/goals with staff: Yes. Medical problems stabilized or resolved: Yes. Denies suicidal/homicidal ideation: Yes. Issues/concerns per patient self-inventory: No.  New problem(s) identified: No, Describe:  none  New Short Term/Long Term Goal(s): medication stabilization, elimination of SI thoughts, development of comprehensive mental wellness plan.   Patient Goals:  "To get back on track with work, to get my daughter and myself enrolled in school"  Discharge Plan or Barriers: Patient recently admitted. CSW will continue to follow and assess for appropriate referrals and possible discharge planning.    Reason for Continuation of Hospitalization: Delusions  Mania Medication stabilization  Estimated Length of Stay: 3-5 days  Attendees: Patient: Erin Ponce 10/28/2020  Physician: Myles Lipps, MD 10/28/2020   Nursing:  10/28/2020   RN Care Manager: 10/28/2020   Social Worker: Darletta Moll, LCSW 10/28/2020   Recreational Therapist:  10/28/2020   Other:  10/28/2020   Other:  10/28/2020  Other: 10/28/2020     Scribe for Treatment Team: Vassie Moselle, LCSW 10/28/2020 9:53 AM
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
## 2020-10-28 NOTE — Progress Notes (Signed)
Pt up at the nurse's station c/o not being able to sleep. Pt told that sleep meds not given after 2 am. Pt educated on the need to inform doctor if she has trouble sleeping so dosage can be adjusted. Pt also told that she needs to allow medication to work and not fight to stay awake after taking it. Pt states that she will try to fall back asleep on her own.

## 2020-10-28 NOTE — Progress Notes (Signed)
Recreation Therapy Notes  INPATIENT RECREATION THERAPY ASSESSMENT  Patient Details Name: Erin Ponce MRN: [id number] DOB: 1996-05-05 Today's Date: 10/28/2020       Information Obtained From: Patient  Able to Participate in Assessment/Interview: Yes  Patient Presentation: Alert  Reason for Admission (Per Patient): Other (Comments) ("anxiety attacks")  Patient Stressors: Building control surveyor (Comment) ("putting daughter in school")  Coping Skills:   Isolation,Journal,Sports,Arguments,Aggression,Music,Exercise,Meditate,Deep Immunologist  Leisure Interests (2+):  Music - Listen,Individual - Other (Comment),Community - Other (Comment) (Play with daughter; Go for a drive)  Frequency of Recreation/Participation: Other (Comment) (Not lately)  Awareness of Community Resources:  Yes  Community Resources:  Sports administrator (Comment) (Zoo; Museums)  Current Use: Yes  If no, Barriers?: Other (Comment) (Pt stated she uses these places when her car is working or asks for a ride.)  Expressed Interest in [employer] Information: No  [employer] of Residence:  Engineer, technical sales  Patient Main Form of Transportation: Set designer (Pt also stated she asks for rides when her car isn't working.)  Patient Strengths:  "I don't know"  Patient Identified Areas of Improvement:  Self value/self love; Proper nutrition  Patient Goal for Hospitalization:  "to get back outside and handle the things I need to take care of like registering my daughter for school"  Current SI (including self-harm):  No  Current HI:  No  Current AVH: No  Staff Intervention Plan: Group Attendance,Collaborate with Interdisciplinary Treatment Team  Consent to Intern Participation: N/A    Caroll Rancher, LRT/CTRS  Caroll Rancher A 10/28/2020, 12:07 PM

## 2020-10-28 NOTE — Progress Notes (Signed)
Recreation Therapy Notes  Date: 2.23.22 Time: 1000 Location: 500 Hall Dayroom  Group Topic: Wellness  Goal Area(s) Addresses:  Patient will define components of whole wellness. Patient will verbalize benefit of whole wellness.  Behavioral Response: Engaged  Intervention: Music, Chairs  Activity: Exercise.  LRT and patients did a series of stretches to loosen the body to prevent any injuries during the course of the exercises.  After stretching, each patient got the chance to lead the group in an exercise of their choice.  LRT and patients were to complete at least 30 minutes of exercise.  Patients were encouraged to take breaks and get water if needed.  Education: Wellness, Building control surveyor.   Education Outcome: Acknowledges education/In group clarification offered/Needs additional education.   Clinical Observations/Feedback: Pt was smiling and social with peers during group session.  Pt was attentive and completed the exercises presented in group.  Pt lead group in jogging in place and some other arm exercises.  Pt was attentive to the importance of making time for yourself and taking all aspects of wellness into consideration.   Caroll Rancher, LRT/CTRS    Caroll Rancher A 10/28/2020 11:26 AM
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
## 2020-10-28 NOTE — Progress Notes (Signed)
Progress note    10/28/20 0738  Psych Admission Type (Psych Patients Only)  Admission Status Involuntary  Psychosocial Assessment  Patient Complaints Anxiety;Nervousness;Suspiciousness  Eye Contact Fair  Facial Expression Anxious;Pensive  Affect Anxious;Preoccupied  Speech Logical/coherent  Interaction Cautious;Forwards little;Guarded;Minimal  Motor Activity Fidgety  Appearance/Hygiene Improved  Behavior Characteristics Cooperative;Anxious;Guarded  Mood Suspicious;Preoccupied;Pleasant  Thought Chartered certified accountant of ideas;Tangential  Content Blaming others;Paranoia  Delusions Paranoid  Perception WDL  Hallucination None reported or observed  Judgment Poor  Confusion None  Danger to Self  Current suicidal ideation? Denies  Danger to Others  Danger to Others None reported or observed

## 2020-10-28 NOTE — Progress Notes (Signed)
Pt up to the nursing station requesting something else to help her sleep. Per Barbara Cower NP- pt ordered 1x 50 mg Trazodone . Pt encouraged to try to lay down before she takes another pill. Will continue to evaluate

## 2020-10-28 NOTE — Progress Notes (Addendum)
Pt stated she was feeling better this evening. Pt appeared less anxious . Pt visible in the dayroom some. Pt given PRN Vistaril and Trazodone per MAR with HS medication    10/28/20 2100  Psych Admission Type (Psych Patients Only)  Admission Status Involuntary  Psychosocial Assessment  Patient Complaints Anxiety;Worrying  Eye Contact Fair  Facial Expression Anxious;Pensive  Affect Anxious;Preoccupied  Speech Logical/coherent  Interaction Cautious;Forwards little;Guarded;Minimal  Motor Activity Fidgety  Appearance/Hygiene Improved  Behavior Characteristics Anxious;Guarded;Cooperative  Mood Pleasant;Preoccupied;Suspicious  Thought Chartered certified accountant of ideas;Tangential  Content Blaming others;Paranoia  Delusions Paranoid  Perception WDL  Hallucination None reported or observed  Judgment Poor  Confusion None  Danger to Self  Current suicidal ideation? Denies  Danger to Others  Danger to Others None reported or observed

## 2020-10-28 NOTE — Plan of Care (Signed)
  Problem: Education: Goal: Ability to state activities that reduce stress will improve Outcome: Progressing   Problem: Coping: Goal: Ability to identify and develop effective coping behavior will improve Outcome: Progressing   Problem: Self-Concept: Goal: Ability to identify factors that promote anxiety will improve Outcome: Progressing   

## 2020-10-28 NOTE — [facility] Group Notes (Signed)
[facility] LCSW Group Therapy  10/28/2020 1:36 PM  Type of Therapy and Topic: Group Therapy: Anger Management   Description of Group: In this group, patients will learn helpful strategies and techniques to manage anger, express anger in alternative ways, change hostile attitudes, and prevent aggressive acts, such as verbal abuse and violence.This group will be process-oriented and eductional, with patients participating in exploration of their own experiences as well as giving and receiving support and challenge from other group members.  Therapeutic Goals: 1. Patient will learn to manage anger. 2. Patient will learn to stop violence or the threat of violence. 3. Patient will learn to develop self control over thoughts and actions. 4. Patient will receive support and feedback from others  Therapeutic Modalities: Cognitive Behavioral Therapy Solution Focused Therapy Motivational Interviewing  Due to scheduling conflicts, this group has been supplemented with worksheets.  Coltrane Tugwell A Batoul Limes 10/28/2020, 1:36 PM   

## 2020-10-28 NOTE — Progress Notes (Signed)
NUTRITION ASSESSMENT  Pt identified as at risk on the Malnutrition Screen Tool  INTERVENTION: 1. Supplements: Ensure Plus po BID, each supplement provides 350 kcal and 13 grams of protein  NUTRITION DIAGNOSIS: Unintentional weight loss related to sub-optimal intake as evidenced by pt report.   Goal: Pt to meet >/= 90% of their estimated nutrition needs.  Monitor:  PO intake  Assessment:  Pt admitted for depression, with AMS. Pt reported having some abdominal pain PTA. Pt has been ordered Ensure supplements.  Will continue given lower body weight.  Height: Ht Readings from Last 1 Encounters:  10/27/20 5\' 4"  (1.626 m)    Weight: Wt Readings from Last 1 Encounters:  10/27/20 52.2 kg    Weight Hx: Wt Readings from Last 10 Encounters:  10/27/20 52.2 kg  10/26/20 54.4 kg  03/30/18 54.4 kg  03/29/18 54.4 kg  12/04/17 60.3 kg  07/10/16 60.3 kg  06/28/16 61.5 kg  06/27/16 60.3 kg  06/13/16 60.3 kg  05/10/16 59.4 kg    BMI:  Body mass index is 19.74 kg/m. Pt meets criteria for normal based on current BMI.  Estimated Nutritional Needs: Kcal: 25-30 kcal/kg Protein: > 1 gram protein/kg Fluid: 1 ml/kcal  Diet Order:  Diet Order            Diet regular Room service appropriate? Yes; Fluid consistency: Thin  Diet effective now                Pt is also offered choice of unit snacks mid-morning and mid-afternoon.  Pt is eating as desired.   Lab results and medications reviewed.   07/10/16, MS, RD, LDN Inpatient Clinical Dietitian Contact information available via Amion

## 2020-10-29 MED ORDER — BENZTROPINE MESYLATE 0.5 MG PO TABS
0.5000 mg | ORAL_TABLET | Freq: Two times a day (BID) | ORAL | Status: DC | PRN
  Administered 2020-10-29: 0.5 mg via ORAL
  Filled 2020-10-29: qty 1

## 2020-10-29 MED ORDER — TRAZODONE HCL 100 MG PO TABS
100.0000 mg | ORAL_TABLET | Freq: Every evening | ORAL | Status: DC | PRN
  Administered 2020-10-29 – 2020-10-30 (×2): 100 mg via ORAL
  Filled 2020-10-29: qty 1
  Filled 2020-10-29: qty 7
  Filled 2020-10-29: qty 1

## 2020-10-29 MED ORDER — RISPERIDONE 2 MG PO TBDP
4.0000 mg | ORAL_TABLET | Freq: Every day | ORAL | Status: DC
  Administered 2020-10-29 – 2020-10-30 (×2): 4 mg via ORAL
  Filled 2020-10-29 (×3): qty 2

## 2020-10-29 MED ORDER — HYDROXYZINE HCL 25 MG PO TABS
25.0000 mg | ORAL_TABLET | ORAL | Status: DC | PRN
  Administered 2020-10-29 – 2020-10-30 (×3): 25 mg via ORAL
  Filled 2020-10-29 (×3): qty 1
  Filled 2020-10-29: qty 10

## 2020-10-29 MED ORDER — RISPERIDONE 1 MG PO TBDP
1.0000 mg | ORAL_TABLET | Freq: Every day | ORAL | Status: DC
  Administered 2020-10-30 – 2020-10-31 (×2): 1 mg via ORAL
  Filled 2020-10-29 (×3): qty 1

## 2020-10-29 NOTE — Progress Notes (Signed)
   10/29/20 1400  Psych Admission Type (Psych Patients Only)  Admission Status Involuntary  Psychosocial Assessment  Patient Complaints Worrying  Eye Contact Fair  Facial Expression Anxious;Pensive  Affect Anxious;Preoccupied  Speech Logical/coherent  Interaction Cautious;Forwards little;Guarded;Minimal  Motor Activity Fidgety  Appearance/Hygiene Improved  Behavior Characteristics Cooperative  Mood Anxious;Preoccupied  Thought Chartered certified accountant of ideas;Tangential  Content Blaming others;Paranoia  Delusions Paranoid  Perception WDL  Hallucination None reported or observed  Judgment Poor  Confusion None  Danger to Self  Current suicidal ideation? Denies  Danger to Others  Danger to Others None reported or observed

## 2020-10-29 NOTE — Progress Notes (Signed)
Pt visibly happier on the unit this evening , pt interacting more with peers / staff. Pt stated she was doing better.    10/29/20 2200  Psych Admission Type (Psych Patients Only)  Admission Status Involuntary  Psychosocial Assessment  Patient Complaints Anxiety  Eye Contact Fair  Facial Expression Anxious;Pensive  Affect Anxious;Preoccupied  Speech Logical/coherent  Interaction Cautious;Forwards little;Guarded;Minimal  Motor Activity Fidgety  Appearance/Hygiene Improved  Behavior Characteristics Cooperative  Mood Anxious;Preoccupied  Thought Chartered certified accountant of ideas;Tangential  Content Blaming others;Paranoia  Delusions Paranoid  Perception WDL  Hallucination None reported or observed  Judgment Poor  Confusion None  Danger to Self  Current suicidal ideation? Denies  Danger to Others  Danger to Others None reported or observed

## 2020-10-29 NOTE — Progress Notes (Signed)
   10/29/20 0500  Sleep  Number of Hours 5.5   

## 2020-10-29 NOTE — Progress Notes (Signed)
Recreation Therapy Notes  Date: 2.24.22 Time: 1000 Location: 500 Hall Dayroom  Group Topic: Triggers  Goal Area(s) Addresses:  Patient will identify their biggest triggers. Patient will identify how to deal with triggers head on. Patient will identify how to avoid triggers.  Behavioral Response: Engaged  Intervention: Worksheet, Pencils, Web designer   Activity: Triggers.  Patients were to identify triggers, how to avoid them and how to deal with them head on.  Before dealing with triggers, patients completed an ice breaker to loosen up the group and get them engaged.  Education: Triggers, Discharge Planning  Education Outcome: Acknowledges understanding/In group clarification offered/Needs additional education.   Clinical Observations/Feedback: Pt missed the ice breaker but was fully engaged with the rest of group session.  Pt identified triggers as being dismissed/ignored and scheduling/missing time slots or appointments; avoids triggers by not taking things personal/ignore them, do controlled breathing and journal feelings; deals with triggers head on by asking the person for a time to talk and discuss the situation, ask self what's bothering her and call family or friend to share feelings/talk.    Caroll Rancher, LRT/CTRS    Caroll Rancher A 10/29/2020 10:51 AM

## 2020-10-29 NOTE — [facility] Group Notes (Signed)
Occupational Therapy Group Note Date: 10/29/2020 Group Topic/Focus: Self-Care  Group Description: Group encouraged increased participation and engagement through discussion focused on Self-Care. Group members were encouraged to complete a self-care pie chart diagram that identified specific categories within self-care that need improvement, including physical, emotional/psychological, social, spiritual, and professional. Discussion focused on identifying which areas need the most work/improvement and brainstormed strategies and tips to improve self-care.  Participation Level: Active   Participation Quality: Independent   Behavior: Calm and Cooperative   Speech/Thought Process: Focused   Affect/Mood: Constricted   Insight: Fair   Judgement: Fair   Individualization: Larin was active and independent in her participation; of note pt was only one present for group. Pt engaged actively in activity and explored several areas of self-care she would like to improve on including balancing work and life responsibilities, taking care of herself physically by getting her hair done, taking care of her daughter, engaging more actively in activities she enjoys (paint, coloring), and getting further in touch with her spirituality (attending [employer], prayer). Expressed gratitude for activity and shared the activity made her start to think more in depth about her self-care.   Modes of Intervention: Activity, Discussion and Education  Patient Response to Interventions:  Attentive, Engaged and Interested   Plan: Continue to engage patient in OT groups 2 - 3x/week.  10/29/2020  Donne Hazel, MOT, OTR/L

## 2020-10-29 NOTE — Progress Notes (Signed)
[facility] MD Progress Note  10/29/2020 11:49 AM Erin Ponce  MRN:  [id number] Subjective: [name] a 25 year old female with a reported past psychiatric history significant for psychosis and depression who presented to the [facility] emergency department on 10/24/2020 believing that she was pregnant, being disorganized, and worried about people "tracking her".  Objective: [name] seen and examined.  [name] a 24 year old female with the above-stated past psychiatric history who is seen in follow-up.  She is more organized today.  She is rather vague about her symptoms.  She stated that she is "working them out in my mind".  She denied any auditory or visual hallucinations.  She still is having some paranoid delusions as well as somatic delusions.  She asked me whether or not a lump on her side could be a pregnancy, and I told her that our test that showed that she was not pregnant.  She then stated "all, I know I am not pregnant".  Part of her original presentation was that she believes she was pregnant.  Additionally I tried to collect some information with regard to her previous treatment.  She had apparently been given a prescription for the long-acting paliperidone injection after discharge, and went to [facility] in 2019.  She stated that she went to [facility], and they wrote for her Haldol and several other medicines.  She took them for 30 days, and did not have them refilled.  She complains of fatigue today.  She only slept 5.5 hours last night, and we discussed the fact that she had not slept for several days prior to admission.  Originally her blood pressure was 118/80 and her pulse was 102.  She is afebrile.  Repeat pulse was 120.  Her pulse oximetry on room air was 100%.  Lipid panel from 2/23 was completely normal.  Hemoglobin A1c was 5.7.  TSH was 0.935.  Principal Problem: <principal problem not specified> Diagnosis: Active Problems:   Psychosis (HCC)  Total Time spent  with patient: 20 minutes  Past Psychiatric History: See admission H&P  Past Medical History:  Past Medical History:  Diagnosis Date  . Anxiety   . Dislocated knee 2008   R knee  . Miscarriage     Past Surgical History:  Procedure Laterality Date  . NO PAST SURGERIES     Family History:  Family History  Problem Relation Age of Onset  . Diabetes Mother   . Thyroid disease Mother    Family Psychiatric  History: See admission H&P Social History:  Social History   Substance and Sexual Activity  Alcohol Use No     Social History   Substance and Sexual Activity  Drug Use Yes  . Types: Marijuana   Comment: recently stopped    Social History   Socioeconomic History  . Marital status: Significant Other    Spouse name: Not on file  . Number of children: 1  . Years of education: 70  . Highest education level: 10th grade  Occupational History  . Occupation: Home health aide  Tobacco Use  . Smoking status: Never Smoker  . Smokeless tobacco: Never Used  Vaping Use  . Vaping Use: Never used  Substance and Sexual Activity  . Alcohol use: No  . Drug use: Yes    Types: Marijuana    Comment: recently stopped  . Sexual activity: Yes    Birth control/protection: None  Other Topics Concern  . Not on file  Social History Narrative  .  Not on file   Social Determinants of Health   Financial Resource Strain: Not on file  Food Insecurity: Not on file  Transportation Needs: Not on file  Physical Activity: Not on file  Stress: Not on file  Social Connections: Not on file   Additional Social History:                         Sleep: Fair  Appetite:  Fair  Current Medications: Current Facility-Administered Medications  Medication Dose Route Frequency Provider Last Rate Last Admin  . acetaminophen (TYLENOL) tablet 650 mg  650 mg Oral Q6H PRN Antonieta Pert, MD   650 mg at 10/27/20 2054  . alum & mag hydroxide-simeth (MAALOX/MYLANTA) 200-200-20 MG/5ML  suspension 30 mL  30 mL Oral Q4H PRN Antonieta Pert, MD      . feeding supplement (ENSURE ENLIVE / ENSURE PLUS) liquid 237 mL  237 mL Oral BID BM Nira Conn A, NP   237 mL at 10/29/20 0901  . hydrOXYzine (ATARAX/VISTARIL) tablet 25 mg  25 mg Oral TID PRN Antonieta Pert, MD   25 mg at 10/28/20 2043  . risperiDONE (RISPERDAL M-TABS) disintegrating tablet 2 mg  2 mg Oral Q8H PRN Antonieta Pert, MD       And  . LORazepam (ATIVAN) tablet 1 mg  1 mg Oral Q6H PRN Antonieta Pert, MD       And  . ziprasidone (GEODON) injection 20 mg  20 mg Intramuscular Q6H PRN Antonieta Pert, MD      . magnesium hydroxide (MILK OF MAGNESIA) suspension 30 mL  30 mL Oral Daily PRN Antonieta Pert, MD      . risperiDONE (RISPERDAL M-TABS) disintegrating tablet 2 mg  2 mg Oral Daily Antonieta Pert, MD   2 mg at 10/29/20 8413  . risperiDONE (RISPERDAL M-TABS) disintegrating tablet 3 mg  3 mg Oral QHS Antonieta Pert, MD   3 mg at 10/28/20 2043  . traZODone (DESYREL) tablet 50 mg  50 mg Oral QHS PRN Antonieta Pert, MD   50 mg at 10/28/20 2043  . traZODone (DESYREL) tablet 50 mg  50 mg Oral Once Jackelyn Poling, NP        Lab Results:  Results for orders placed or performed during the hospital encounter of 10/27/20 (from the past 48 hour(s))  Lipid panel     Status: Abnormal   Collection Time: 10/28/20  6:01 PM  Result Value Ref Range   Cholesterol 122 0 - 200 mg/dL   Triglycerides 60 <244 mg/dL   HDL 38 (L) >01 mg/dL   Total CHOL/HDL Ratio 3.2 RATIO   VLDL 12 0 - 40 mg/dL   LDL Cholesterol 72 0 - 99 mg/dL    Comment:        Total Cholesterol/HDL:CHD Risk Coronary Heart Disease Risk Table                     Men   Women  1/2 Average Risk   3.4   3.3  Average Risk       5.0   4.4  2 X Average Risk   9.6   7.1  3 X Average Risk  23.4   11.0        Use the calculated Patient Ratio above and the CHD Risk Table to determine the patient's CHD Risk.        ATP III  CLASSIFICATION  (LDL):  <100     mg/dL   Optimal  [id number]  mg/dL   Near or Above                    Optimal  130-159  mg/dL   Borderline  [phone]  mg/dL   High  >956>190     mg/dL   Very High Performed at [facility], [address] [place], [place] [address]   Hemoglobin A1c     Status: Abnormal   Collection Time: 10/28/20  6:01 PM  Result Value Ref Range   Hgb A1c MFr Bld 5.7 (H) 4.8 - 5.6 %    Comment: (NOTE) Pre diabetes:          5.7%-6.4%  Diabetes:              >6.4%  Glycemic control for   <7.0% adults with diabetes    Mean Plasma Glucose 116.89 mg/dL    Comment: Performed at [facility], [address] [place], [place] [address]  TSH     Status: None   Collection Time: 10/28/20  6:01 PM  Result Value Ref Range   TSH 0.935 0.350 - 4.500 uIU/mL    Comment: Performed by a 3rd Generation assay with a functional sensitivity of <=0.01 uIU/mL. Performed at [facility], [address] [place], [place] [address]     Blood Alcohol level:  Lab Results  Component Value Date   ETH <10 10/24/2020   ETH <10 04/29/2019    Metabolic Disorder Labs: Lab Results  Component Value Date   HGBA1C 5.7 (H) 10/28/2020   MPG 116.89 10/28/2020   MPG 108.28 04/03/2018   Lab Results  Component Value Date   PROLACTIN 160.1 (H) 04/03/2018   Lab Results  Component Value Date   CHOL 122 10/28/2020   TRIG 60 10/28/2020   HDL 38 (L) 10/28/2020   CHOLHDL 3.2 10/28/2020   VLDL 12 10/28/2020   LDLCALC 72 10/28/2020   LDLCALC 79 04/03/2018    Physical Findings: AIMS: Facial and Oral Movements Muscles of Facial Expression: None, normal Lips and Perioral Area: None, normal Jaw: None, normal Tongue: None, normal,Extremity Movements Upper (arms, wrists, hands, fingers): None, normal Lower (legs, knees, ankles, toes): None, normal, Trunk Movements Neck, shoulders, hips: None, normal, Overall Severity Severity of abnormal movements (highest score from  questions above): None, normal Incapacitation due to abnormal movements: None, normal Patient's awareness of abnormal movements (rate only patient's report): No Awareness, Dental Status Current problems with teeth and/or dentures?: No Does patient usually wear dentures?: No  CIWA:    COWS:     Musculoskeletal: Strength & Muscle Tone: within normal limits Gait & Station: normal Patient leans: N/A  Psychiatric Specialty Exam: Physical Exam Vitals and nursing note reviewed.  Constitutional:      Appearance: Normal appearance.  HENT:     Head: Normocephalic and atraumatic.  Pulmonary:     Effort: Pulmonary effort is normal.  Neurological:     General: No focal deficit present.     Mental Status: She is alert and oriented to person, place, and time.     Review of Systems  Blood pressure 122/71, pulse (!) 120, temperature 98.4 F (36.9 C), temperature source Oral, resp. rate 18, height 5\' 4"  (1.626 m), weight 52.2 kg, SpO2 100 %, currently breastfeeding.Body mass index is 19.74 kg/m.  General Appearance: Fairly Groomed  Eye Contact:  Fair  Speech:  Normal  Rate  Volume:  Normal  Mood:  Dysphoric  Affect:  Congruent  Thought Process:  Coherent and Descriptions of Associations: Loose  Orientation:  Full (Time, Place, and Person)  Thought Content:  Delusions and Paranoid Ideation  Suicidal Thoughts:  No  Homicidal Thoughts:  No  Memory:  Immediate;   Fair Recent;   Fair Remote;   Fair  Judgement:  Intact  Insight:  Lacking  Psychomotor Activity:  Normal  Concentration:  Concentration: Fair and Attention Span: Fair  Recall:  [employer] of Knowledge:  Fair  Language:  Good  Akathisia:  Negative  Handed:  Right  AIMS (if indicated):     Assets:  Desire for Improvement Resilience  ADL's:  Intact  Cognition:  WNL  Sleep:  Number of Hours: 5.5     Treatment Plan Summary: Daily contact with patient to assess and evaluate symptoms and progress in treatment, Medication  management and Plan : [name] seen and examined.  [name] a 25 year old female with the above-stated past psychiatric history who is seen in follow-up.  Diagnosis: 1.  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 2.  Cannabis use disorder  Pertinent findings on examination today: 1.  Her disorganization from admission is significantly improved. 2.  Patient continues to have paranoid and somatic delusions. 3.  Tachycardia  Plan: 1.  Change Risperdal to 1 mg p.o. daily and 4 mg p.o. nightly for psychosis. 2.  Continue hydroxyzine 25 mg p.o. 4 times daily as needed anxiety. 3.  Increase trazodone to 100 mg p.o. nightly as needed insomnia. 4.  Disposition planning-in progress. Antonieta Pert, MD 10/29/2020, 11:49 AM

## 2020-10-29 NOTE — [facility] Suicide Risk Assessment (Signed)
[facility] INPATIENT:  Family/Significant Other Suicide Prevention Education    Suicide Prevention Education:  Contact Attempts: Father, Murrel Freet ([phone]), has been identified by the patient as the family member/significant other with whom the patient will be residing, and identified as the person(s) who will aid the patient in the event of a mental health crisis.  With written consent from the patient, two attempts were made to provide suicide prevention education, prior to and/or following the patient's discharge.  We were unsuccessful in providing suicide prevention education.  A suicide education pamphlet was given to the patient to share with family/significant other.   Date and time of first attempt: 10/29/2020 at 10:30am CSW left a HIPAA compliant voicemail for pt's father. Date and time of second attempt: Second attempt still needs to be made.     Ruthann Cancer MSW, LCSW Clincal Social Worker  [facility]

## 2020-10-30 NOTE — Progress Notes (Signed)
   10/30/20 1500  Psych Admission Type (Psych Patients Only)  Admission Status Involuntary  Psychosocial Assessment  Patient Complaints Anxiety  Eye Contact Fair  Facial Expression Anxious;Pensive  Affect Anxious;Preoccupied  Speech Logical/coherent  Interaction Cautious;Forwards little;Guarded;Minimal  Motor Activity Fidgety  Appearance/Hygiene Improved  Behavior Characteristics Cooperative  Mood Anxious  Thought Chartered certified accountant of ideas;Tangential  Content Blaming others;Paranoia  Delusions Paranoid  Perception WDL  Hallucination None reported or observed  Judgment Poor  Confusion None  Danger to Self  Current suicidal ideation? Denies  Danger to Others  Danger to Others None reported or observed

## 2020-10-30 NOTE — [facility] Group Notes (Signed)
[facility] LCSW Group Therapy  10/30/2020 1:20 PM  Type of Therapy:  Group Therapy: Self-Criticism vs Self-Compassion  Participation Level:  None  Participation Quality:  Resistant  Affect:  Depressed  Cognitive:  Appropriate  Insight:  Did not participate in discussion  Engagement in Therapy:  None  Modes of Intervention:  Discussion and Education  Summary of Progress/Problems: Patient attended group, however offered no participation.   Meredith A Rumbley 10/30/2020, 1:20 PM

## 2020-10-30 NOTE — Progress Notes (Signed)
[facility] MD Progress Note  10/30/2020 10:51 AM Cameryn Schum  MRN:  [id number] Subjective:  [name] a 25 year old female with a reported past psychiatric history significant for psychosis and depression who presented to the [facility] emergency department on 10/24/2020 believing that she was pregnant, being disorganized, and worried about people "tracking her".  Objective: [name] seen and examined. [name] a 25 year old female with the above-stated past psychiatric history who is seen in follow-up. She continues to slowly improve. Her paranoia has decreased, she is much less focused on having been pregnant or being tracked. Her sleep is improved, and she is not having any suicidal or homicidal ideation. She denied any side effects to her current medications. She slept 6.5 hours last night. Her blood pressure is normalized, but she remains tachycardic. Her EKG from 10/27/2020 showed normal sinus rhythm. There was some suggestion of a previous myocardial infarction. The EKG was completely unchanged from previous. QTc interval was at 384. Hemoglobin A1c from 2/23 was 5.7. TSH was 0.935.  Principal Problem: <principal problem not specified> Diagnosis: Active Problems:   Psychosis (HCC)  Total Time spent with patient: 20 minutes  Past Psychiatric History: See admission H&P  Past Medical History:  Past Medical History:  Diagnosis Date  . Anxiety   . Dislocated knee 2008   R knee  . Miscarriage     Past Surgical History:  Procedure Laterality Date  . NO PAST SURGERIES     Family History:  Family History  Problem Relation Age of Onset  . Diabetes Mother   . Thyroid disease Mother    Family Psychiatric  History: See admission H&P Social History:  Social History   Substance and Sexual Activity  Alcohol Use No     Social History   Substance and Sexual Activity  Drug Use Yes  . Types: Marijuana   Comment: recently stopped    Social History   Socioeconomic  History  . Marital status: Significant Other    Spouse name: Not on file  . Number of children: 1  . Years of education: 14  . Highest education level: 10th grade  Occupational History  . Occupation: Home health aide  Tobacco Use  . Smoking status: Never Smoker  . Smokeless tobacco: Never Used  Vaping Use  . Vaping Use: Never used  Substance and Sexual Activity  . Alcohol use: No  . Drug use: Yes    Types: Marijuana    Comment: recently stopped  . Sexual activity: Yes    Birth control/protection: None  Other Topics Concern  . Not on file  Social History Narrative  . Not on file   Social Determinants of Health   Financial Resource Strain: Not on file  Food Insecurity: Not on file  Transportation Needs: Not on file  Physical Activity: Not on file  Stress: Not on file  Social Connections: Not on file   Additional Social History:                         Sleep: Good  Appetite:  Good  Current Medications: Current Facility-Administered Medications  Medication Dose Route Frequency Provider Last Rate Last Admin  . acetaminophen (TYLENOL) tablet 650 mg  650 mg Oral Q6H PRN Antonieta Pert, MD   650 mg at 10/27/20 2054  . alum & mag hydroxide-simeth (MAALOX/MYLANTA) 200-200-20 MG/5ML suspension 30 mL  30 mL Oral Q4H PRN Antonieta Pert, MD      .  benztropine (COGENTIN) tablet 0.5 mg  0.5 mg Oral BID PRN Antonieta Pert, MD   0.5 mg at 10/29/20 1502  . feeding supplement (ENSURE ENLIVE / ENSURE PLUS) liquid 237 mL  237 mL Oral BID BM Nira Conn A, NP   237 mL at 10/30/20 1000  . hydrOXYzine (ATARAX/VISTARIL) tablet 25 mg  25 mg Oral Q4H PRN Antonieta Pert, MD   25 mg at 10/30/20 0759  . risperiDONE (RISPERDAL M-TABS) disintegrating tablet 2 mg  2 mg Oral Q8H PRN Antonieta Pert, MD       And  . LORazepam (ATIVAN) tablet 1 mg  1 mg Oral Q6H PRN Antonieta Pert, MD       And  . ziprasidone (GEODON) injection 20 mg  20 mg Intramuscular Q6H PRN  Antonieta Pert, MD      . magnesium hydroxide (MILK OF MAGNESIA) suspension 30 mL  30 mL Oral Daily PRN Antonieta Pert, MD      . risperiDONE (RISPERDAL M-TABS) disintegrating tablet 1 mg  1 mg Oral Daily Antonieta Pert, MD   1 mg at 10/30/20 0757  . risperiDONE (RISPERDAL M-TABS) disintegrating tablet 4 mg  4 mg Oral QHS Antonieta Pert, MD   4 mg at 10/29/20 2114  . traZODone (DESYREL) tablet 100 mg  100 mg Oral QHS PRN Antonieta Pert, MD   100 mg at 10/29/20 2114  . traZODone (DESYREL) tablet 50 mg  50 mg Oral Once Jackelyn Poling, NP        Lab Results:  Results for orders placed or performed during the hospital encounter of 10/27/20 (from the past 48 hour(s))  Lipid panel     Status: Abnormal   Collection Time: 10/28/20  6:01 PM  Result Value Ref Range   Cholesterol 122 0 - 200 mg/dL   Triglycerides 60 <283 mg/dL   HDL 38 (L) >15 mg/dL   Total CHOL/HDL Ratio 3.2 RATIO   VLDL 12 0 - 40 mg/dL   LDL Cholesterol 72 0 - 99 mg/dL    Comment:        Total Cholesterol/HDL:CHD Risk Coronary Heart Disease Risk Table                     Men   Women  1/2 Average Risk   3.4   3.3  Average Risk       5.0   4.4  2 X Average Risk   9.6   7.1  3 X Average Risk  23.4   11.0        Use the calculated Patient Ratio above and the CHD Risk Table to determine the patient's CHD Risk.        ATP III CLASSIFICATION (LDL):  <100     mg/dL   Optimal  176-160  mg/dL   Near or Above                    Optimal  130-159  mg/dL   Borderline  737-106  mg/dL   High  >269     mg/dL   Very High Performed at [facility], [address] [place], [place] [address]   Hemoglobin A1c     Status: Abnormal   Collection Time: 10/28/20  6:01 PM  Result Value Ref Range   Hgb A1c MFr Bld 5.7 (H) 4.8 - 5.6 %    Comment: (NOTE) Pre diabetes:  5.7%-6.4%  Diabetes:              >6.4%  Glycemic control for   <7.0% adults with diabetes    Mean Plasma Glucose 116.89  mg/dL    Comment: Performed at [facility], [address], [place], [place] [address]  TSH     Status: None   Collection Time: 10/28/20  6:01 PM  Result Value Ref Range   TSH 0.935 0.350 - 4.500 uIU/mL    Comment: Performed by a 3rd Generation assay with a functional sensitivity of <=0.01 uIU/mL. Performed at [facility], [address], [place], [place] [address]     Blood Alcohol level:  Lab Results  Component Value Date   ETH <10 10/24/2020   ETH <10 04/29/2019    Metabolic Disorder Labs: Lab Results  Component Value Date   HGBA1C 5.7 (H) 10/28/2020   MPG 116.89 10/28/2020   MPG 108.28 04/03/2018   Lab Results  Component Value Date   PROLACTIN 160.1 (H) 04/03/2018   Lab Results  Component Value Date   CHOL 122 10/28/2020   TRIG 60 10/28/2020   HDL 38 (L) 10/28/2020   CHOLHDL 3.2 10/28/2020   VLDL 12 10/28/2020   LDLCALC 72 10/28/2020   LDLCALC 79 04/03/2018    Physical Findings: AIMS: Facial and Oral Movements Muscles of Facial Expression: None, normal Lips and Perioral Area: None, normal Jaw: None, normal Tongue: None, normal,Extremity Movements Upper (arms, wrists, hands, fingers): None, normal Lower (legs, knees, ankles, toes): None, normal, Trunk Movements Neck, shoulders, hips: None, normal, Overall Severity Severity of abnormal movements (highest score from questions above): None, normal Incapacitation due to abnormal movements: None, normal Patient's awareness of abnormal movements (rate only patient's report): No Awareness, Dental Status Current problems with teeth and/or dentures?: No Does patient usually wear dentures?: No  CIWA:    COWS:     Musculoskeletal: Strength & Muscle Tone: within normal limits Gait & Station: normal Patient leans: N/A  Psychiatric Specialty Exam: Physical Exam Vitals and nursing note reviewed.  Constitutional:      Appearance: Normal appearance.  HENT:     Head: Normocephalic and  atraumatic.  Pulmonary:     Effort: Pulmonary effort is normal.  Neurological:     General: No focal deficit present.     Mental Status: She is alert and oriented to person, place, and time.     Review of Systems  Blood pressure 116/83, pulse (!) 123, temperature 98.8 F (37.1 C), temperature source Oral, resp. rate 18, height 5\' 4"  (1.626 m), weight 52.2 kg, SpO2 99 %, currently breastfeeding.Body mass index is 19.74 kg/m.  General Appearance: Casual  Eye Contact:  Fair  Speech:  Normal Rate  Volume:  Normal  Mood:  Dysphoric  Affect:  Congruent  Thought Process:  Coherent and Descriptions of Associations: Intact  Orientation:  Full (Time, Place, and Person)  Thought Content:  Delusions and Paranoid Ideation  Suicidal Thoughts:  No  Homicidal Thoughts:  No  Memory:  Immediate;   Fair Recent;   Fair Remote;   Fair  Judgement:  Intact  Insight:  Fair  Psychomotor Activity:  Normal  Concentration:  Concentration: Fair and Attention Span: Fair  Recall:  of Knowledge:  Fair  Language:  Good  Akathisia:  Negative  Handed:  Right  AIMS (if indicated):     Assets:  Desire for Improvement Housing Resilience  ADL's:  Intact  Cognition:  WNL  Sleep:  Number of Hours: 6.5     Treatment Plan Summary: Daily contact with patient to assess and evaluate symptoms and progress in treatment, Medication management and Plan : [name] seen and examined. [name] a 25 year old female with the above-stated past psychiatric history who is seen in follow-up.   Diagnosis: 1.  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  Cannabis use disorder 3. Reported history of posttraumatic stress disorder 4. Tachycardia  Pertinent findings on examination today: 1. Disorganization has significantly decreased since admission. 2. Paranoia and somatic delusions continue to slowly improve. 3. Tachycardia continues. 4.  Sleep is improved.  Plan: 1. Continue Risperdal to 1 mg p.o. daily and 4 mg p.o. nightly for psychosis. 2.  Continue hydroxyzine 25 mg p.o. 4 times daily as needed anxiety. 3.   Continue trazodone to 100 mg p.o. nightly as needed insomnia. 4.   Repeat EKG today. 5. Disposition planning-in progress.  Antonieta PertGreg Lawson Adrien Shankar, MD 10/30/2020, 10:51 AM

## 2020-10-30 NOTE — Progress Notes (Signed)
Recreation Therapy Notes  Date: 2.25.22 Time: 1000 Location: 500 Hall Dayroom  Group Topic: Communication, Team Building, Problem Solving   Goal Area(s) Addresses:  Patient will effectively work with peer towards shared goal.  Patient will identify skill used to make activity successful.  Patient will identify how skills used during activity can be used to reach post d/c goals.   Behavioral Response: Engaged  Intervention: Cup Merchant navy officer bands with attached strings enough for each group member, 10 or more cups  Activity: Patient(s) were given a set of solo cups, a rubber band, and some tied strings. The objective is to build a pyramid with the cups by only using the rubber band and string to move the cups. After the activity the patient(s) are LRT debriefed and discussed what strategies worked, what didn't, and what lessons they can take from the activity and use in life post discharge.   Education Areas: Social Skills, Support System, Discharge Planning   Education Outcome: Acknowledges education  Clinical Observations/Feedback: Pt was active.  Pt was bright and smiling throughout group.  Pt worked well with peers in coming up with a strategy to complete the activity.  Pt showed some frustration in trying to maneuver the cups to stack the pyramid.  Pt was able to work through her frustration to complete activity with peers.    Caroll Rancher, LRT/CTRS    [name], Clarice Bonaventure A 10/30/2020 11:30 AM

## 2020-10-30 NOTE — Progress Notes (Signed)
Pt visible in the dayroom interacting with peers / staff. Pt deemed less paranoid and talked more this evening.    10/30/20 2100  Psych Admission Type (Psych Patients Only)  Admission Status Involuntary  Psychosocial Assessment  Patient Complaints Anxiety  Eye Contact Fair  Facial Expression Anxious;Pensive  Affect Anxious;Preoccupied  Speech Logical/coherent  Interaction Cautious;Forwards little;Guarded;Minimal  Motor Activity Fidgety  Appearance/Hygiene Improved  Behavior Characteristics Cooperative  Mood Anxious  Thought Chartered certified accountant of ideas;Tangential  Content Blaming others;Paranoia  Delusions Paranoid  Perception WDL  Hallucination None reported or observed  Judgment Poor  Confusion None  Danger to Self  Current suicidal ideation? Denies  Danger to Others  Danger to Others None reported or observed

## 2020-10-30 NOTE — Progress Notes (Signed)
Adult Psychoeducational Group Note  Date:  10/30/2020 Time:  11:25 PM  Group Topic/Focus:  Wrap-Up Group:   The focus of this group is to help patients review their daily goal of treatment and discuss progress on daily workbooks.  Participation Level:  Active  Participation Quality:  Appropriate  Affect:  Appropriate  Cognitive:  Appropriate  Insight: Appropriate  Engagement in Group:  Developing/Improving  Modes of Intervention:  Discussion  Additional Comments:  Pt stated her goal for today was to focus on her treatment plan. Pt stated she accomplished her goal today. Pt stated she talked with her doctor and social worker about her care today. Pt rated her overall day a 10. Pt stated she was able to contact her mother, father, grandmother, and her cousin today which improved her day. Pt stated she felt better about himself today. Pt stated her relationship with her family and support system needs to be improved. Pt stated she was able to attend all meals these afternoon. Pt stated she took all medications provided today. Pt stated her appetite was good today. Pt rated her sleep last night was good. Pt stated the goal for tonight was to get some rest. Pt stated she was in no physical pain tonight. Pt deny auditory or visual hallucinations. Pt denies thoughts of harming herself or others. Pt stated she would alert staff if anything changes.  Felipa Furnace 10/30/2020, 11:25 PM

## 2020-10-30 NOTE — Progress Notes (Signed)
   10/30/20 0500  Sleep  Number of Hours 6.5   

## 2020-10-31 DIAGNOSIS — F333 Major depressive disorder, recurrent, severe with psychotic symptoms: Principal | ICD-10-CM

## 2020-10-31 MED ORDER — TRAZODONE HCL 100 MG PO TABS: 100.0000 mg | ORAL_TABLET | Freq: Every evening | ORAL | 0 refills | Status: DC | PRN

## 2020-10-31 MED ORDER — RISPERIDONE 4 MG PO TABS: 4.0000 mg | ORAL_TABLET | Freq: Every day | ORAL | 0 refills | Status: DC

## 2020-10-31 MED ORDER — HYDROXYZINE HCL 25 MG PO TABS: 25.0000 mg | ORAL_TABLET | ORAL | 0 refills | Status: DC | PRN

## 2020-10-31 MED ORDER — RISPERIDONE 1 MG PO TABS: 1.0000 mg | ORAL_TABLET | Freq: Every day | ORAL | 0 refills | Status: DC

## 2020-10-31 MED ORDER — RISPERIDONE 1 MG PO TABS
1.0000 mg | ORAL_TABLET | Freq: Every day | ORAL | Status: DC
  Filled 2020-10-31 (×2): qty 1

## 2020-10-31 MED ORDER — RISPERIDONE 2 MG PO TABS
4.0000 mg | ORAL_TABLET | Freq: Every day | ORAL | Status: DC
  Filled 2020-10-31: qty 2

## 2020-10-31 NOTE — Progress Notes (Signed)
Pt discharged to lobby. Pt was stable and appreciative at that time. All papers, samples and prescriptions were given and valuables returned. Verbal understanding expressed. Denies SI/HI and A/VH. Pt given opportunity to express concerns and ask questions.  

## 2020-10-31 NOTE — [facility] Suicide Risk Assessment (Signed)
[facility] Discharge Suicide Risk Assessment   Principal Problem: <principal problem not specified> Discharge Diagnoses: Active Problems:   Psychosis (HCC)   Total Time spent with patient: 30 minutes  Musculoskeletal: Strength & Muscle Tone: within normal limits Gait & Station: normal Patient leans: N/A  Psychiatric Specialty Exam: Review of Systems  All other systems reviewed and are negative.   Blood pressure (!) 114/95, pulse 90, temperature 98.5 F (36.9 C), temperature source Oral, resp. rate 18, height 5\' 4"  (1.626 m), weight 52.2 kg, SpO2 100 %, currently breastfeeding.Body mass index is 19.74 kg/m.  General Appearance: Casual  Eye Contact::  Good  Speech:  Normal Rate409  Volume:  Normal  Mood:  Euthymic  Affect:  Congruent  Thought Process:  Coherent and Descriptions of Associations: Intact  Orientation:  Full (Time, Place, and Person)  Thought Content:  Logical  Suicidal Thoughts:  No  Homicidal Thoughts:  No  Memory:  Immediate;   Good Recent;   Good Remote;   Good  Judgement:  Fair  Insight:  Fair  Psychomotor Activity:  Normal  Concentration:  Good  Recall:  Fair  Fund of Knowledge:Good  Language: Good  Akathisia:  Negative  Handed:  Right  AIMS (if indicated):     Assets:  Desire for Improvement Housing Resilience Social Support  Sleep:  Number of Hours: 7.5  Cognition: WNL  ADL's:  Intact   Mental Status Per Nursing Assessment::   On Admission:  NA  Demographic Factors:  Low socioeconomic status and Unemployed  Loss Factors: Financial problems/change in socioeconomic status  Historical Factors: Impulsivity  Risk Reduction Factors:   Sense of responsibility to family, Living with another person, especially a relative and Positive social support  Continued Clinical Symptoms:  Schizophrenia:   Less than 58 years old Paranoid or undifferentiated type  Cognitive Features That Contribute To Risk:  Thought constriction (tunnel vision)     Suicide Risk:  Minimal: No identifiable suicidal ideation.  Patients presenting with no risk factors but with morbid ruminations; may be classified as minimal risk based on the severity of the depressive symptoms   Follow-up Information    [facility]. Go on 11/09/2020.   Specialty: Behavioral Health Why: You have a walk in appointment for therapy services on 11/09/20 at 7:45 am.    You also have a walk in appointment for medication management on 11/23/20 at 7:45 am.   Walk in appointments are first come, first served and are held in person.   Contact information: [address] [place] [phone]              Plan Of Care/Follow-up recommendations:  Activity:  ad lib  [phone], MD 10/31/2020, 8:00 AM

## 2020-10-31 NOTE — BHH Suicide Risk Assessment (Signed)
[facility] INPATIENT:  Family/Significant Other Suicide Prevention Education  Suicide Prevention Education:  Education Completed; Nolon Nations father of the patient, has been identified by the patient as the family member/significant other with whom the patient will be residing, and identified as the person(s) who will aid the patient in the event of a mental health crisis (suicidal ideations/suicide attempt).  With written consent from the patient, the family member/significant other has been provided the following suicide prevention education, prior to the and/or following the discharge of the patient.  The suicide prevention education provided includes the following:  Suicide risk factors  Suicide prevention and interventions  National Suicide Hotline telephone number  [facility] assessment telephone number  [facility] Emergency Assistance 911  [facility] and/or Residential Mobile Crisis Unit telephone number  Request made of family/significant other to:  Remove weapons (e.g., guns, rifles, knives), all items previously/currently identified as safety concern.    Remove drugs/medications (over-the-counter, prescriptions, illicit drugs), all items previously/currently identified as a safety concern.  The family member/significant other verbalizes understanding of the suicide prevention education information provided.  The family member/significant other agrees to remove the items of safety concern listed above.  Evorn Gong 10/31/2020, 9:33 AM

## 2020-10-31 NOTE — Progress Notes (Signed)
   10/31/20 0500  Sleep  Number of Hours 7.5

## 2020-10-31 NOTE — Progress Notes (Signed)
  [facility] Adult Case Management Discharge Plan :  Will you be returning to the same living situation after discharge:  Yes,  Patient to return to mother's house.  At discharge, do you have transportation home?: Yes,  CSW to assist with transportation.  Do you have the ability to pay for your medications: No.  Release of information consent forms completed and in the chart;  Patient's signature needed at discharge.  Patient to Follow up at:  Follow-up Information    [facility]. Go on 11/09/2020.   Specialty: Behavioral Health Why: SECOND FLOOR. You have a walk in appointment for therapy services on 11/09/20 at 7:45 am.    You also have a walk in appointment for medication management on 11/23/20 at 7:45 am.   Walk in appointments are first come, first served and are held in person.   Contact information: [address] [place] [address] [phone]              Next level of care provider has access to [facility] Link:yes  Safety Planning and Suicide Prevention discussed: Yes,  SPE completed with patient and her father     Has patient been referred to the Quitline?: Patient refused referral  Patient has been referred for addiction treatment: N/A  Corky Crafts, LCSWA 10/31/2020, 9:53 AM
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
## 2020-11-05 ENCOUNTER — Telehealth (INDEPENDENT_AMBULATORY_CARE_PROVIDER_SITE_OTHER): Payer: Medicaid Other | Admitting: Primary Care

## 2020-11-05 ENCOUNTER — Encounter (INDEPENDENT_AMBULATORY_CARE_PROVIDER_SITE_OTHER): Payer: Self-pay | Admitting: Primary Care

## 2020-11-05 DIAGNOSIS — Z09 Encounter for follow-up examination after completed treatment for conditions other than malignant neoplasm: Secondary | ICD-10-CM

## 2020-11-05 DIAGNOSIS — F333 Major depressive disorder, recurrent, severe with psychotic symptoms: Secondary | ICD-10-CM | POA: Diagnosis not present

## 2020-11-05 DIAGNOSIS — Z7689 Persons encountering health services in other specified circumstances: Secondary | ICD-10-CM | POA: Diagnosis not present
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
## 2020-11-06 ENCOUNTER — Telehealth (HOSPITAL_COMMUNITY): Payer: Self-pay | Admitting: *Deleted

## 2020-11-06 NOTE — Telephone Encounter (Signed)
Call from patient who is recently out of [facility] and had questions re her medicine and when she should be taking it. She has been given walk in appts for her assessment and meds on the 7th and 21st of March. Reviewed with her the times of her three meds, two of them are prn and this was confusing for her. She verbalized her understanding and was offered to come in as a walk in before the 21st if she needed to and reviewed with her the dates and times.

## 2020-11-12 ENCOUNTER — Ambulatory Visit (INDEPENDENT_AMBULATORY_CARE_PROVIDER_SITE_OTHER): Payer: Medicaid Other | Admitting: Licensed Clinical Social Worker

## 2020-11-12 DIAGNOSIS — F333 Major depressive disorder, recurrent, severe with psychotic symptoms: Secondary | ICD-10-CM | POA: Diagnosis not present

## 2020-11-12 DIAGNOSIS — F411 Generalized anxiety disorder: Secondary | ICD-10-CM

## 2020-11-12 NOTE — BH Specialist Note (Signed)
Integrated Behavioral Health via Telemedicine Visit  11/12/2020 Erin Ponce [id number]  Number of Integrated Behavioral Health visits: 1 Session Start time: 9:05 AM  Session End time: 9:35 AM Total time: 30  Referring Provider: NP Randa Evens Patient/Family location: Home [facility] Provider location: Office All persons participating in visit: LCSW and Patient Types of Service: Telephone visit  I connected with Erin Ponce via  Telephone or Engineer, civil (consulting)  (Video is Caregility application) and verified that I am speaking with the correct person using two identifiers. Discussed confidentiality: Yes   I discussed the limitations of telemedicine and the availability of in person appointments.  Discussed there is a possibility of technology failure and discussed alternative modes of communication if that failure occurs.  I discussed that engaging in this telemedicine visit, they consent to the provision of behavioral healthcare and the services will be billed under their insurance.  Patient and/or legal guardian expressed understanding and consented to Telemedicine visit: Yes   Presenting Concerns: Patient and/or family reports the following symptoms/concerns: Pt reports difficulty managing anxiety and depression triggered by psychosocial stressors and trauma. Symptoms include difficulty sleeping, racing thoughts, decreased appetite or overeating  Duration of problem: Ongoing; Severity of problem: moderate  Patient and/or Family's Strengths/Protective Factors: Social connections, Social and Emotional competence, Concrete supports in place (healthy food, safe environments, etc.) and Sense of purpose Pt identified minor child as protective factor and motivation agent. Pt is employed as a Teacher, English as a foreign language  Goals Addressed: Patient will: 1.  Reduce symptoms of: anxiety and depression Pt agreed to continue compliance with medication management 2.  Increase knowledge  and/or ability of: coping skills Pt agreed to continue listening to music, reading poetry, and art 3.  Demonstrate ability to: Increase healthy adjustment to current life circumstances and Increase adequate support systems for patient/family Pt agreed to follow up with [facility] for medication management and therapy  Progress towards Goals: Ongoing  Interventions: Interventions utilized:  Solution-Focused Strategies, Supportive Counseling and Psychoeducation and/or Health Education Standardized Assessments completed: GAD-7 and PHQ 2&9  Patient Response: Pt was engaged during session and was successful in identifying strategies to assist with management of symptoms  Assessment: Patient currently experiencing depression and anxiety symptoms triggered by trauma and psychosocial stressors. Pt denies SI/HI.    Patient may benefit from therapy and continued medication managment. Pt agreed to utilize healthy coping skills and has upcoming appointments with [facility].   Plan: 1. Follow up with behavioral health clinician on : Contact LCSW with any additional behavioral health and/or resource needs 2. Behavioral recommendations: Utilize strategies discussed and continue with medication management 3. Referral(s): Integrated Art gallery manager (In Clinic) and [facility] (LME/Outside Clinic)  I discussed the assessment and treatment plan with the patient and/or parent/guardian. They were provided an opportunity to ask questions and all were answered. They agreed with the plan and demonstrated an understanding of the instructions.   They were advised to call back or seek an in-person evaluation if the symptoms worsen or if the condition fails to improve as anticipated.  Bridgett Larsson, LCSW  11/12/20 9:55 AM

## 2020-11-13 ENCOUNTER — Ambulatory Visit: Payer: Self-pay

## 2020-11-16 ENCOUNTER — Ambulatory Visit: Payer: Self-pay

## 2020-11-16 ENCOUNTER — Ambulatory Visit (INDEPENDENT_AMBULATORY_CARE_PROVIDER_SITE_OTHER): Payer: No Payment, Other | Admitting: Professional

## 2020-11-16 ENCOUNTER — Other Ambulatory Visit: Payer: Self-pay

## 2020-11-16 DIAGNOSIS — F339 Major depressive disorder, recurrent, unspecified: Secondary | ICD-10-CM | POA: Insufficient documentation

## 2020-11-16 DIAGNOSIS — F330 Major depressive disorder, recurrent, mild: Secondary | ICD-10-CM
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
## 2020-11-17 ENCOUNTER — Ambulatory Visit (INDEPENDENT_AMBULATORY_CARE_PROVIDER_SITE_OTHER): Payer: Medicaid Other | Admitting: Primary Care

## 2020-11-17 ENCOUNTER — Other Ambulatory Visit: Payer: Self-pay

## 2020-11-17 ENCOUNTER — Encounter (INDEPENDENT_AMBULATORY_CARE_PROVIDER_SITE_OTHER): Payer: Self-pay | Admitting: Primary Care

## 2020-11-17 VITALS — BP 116/75 | HR 103 | Temp 97.7°F | Ht 62.0 in | Wt 139.0 lb

## 2020-11-17 DIAGNOSIS — Z0000 Encounter for general adult medical examination without abnormal findings: Secondary | ICD-10-CM

## 2020-11-17 DIAGNOSIS — N643 Galactorrhea not associated with childbirth: Secondary | ICD-10-CM

## 2020-11-17 DIAGNOSIS — Z23 Encounter for immunization: Secondary | ICD-10-CM | POA: Diagnosis not present

## 2020-11-17 DIAGNOSIS — O926 Galactorrhea: Secondary | ICD-10-CM

## 2020-11-17 NOTE — Progress Notes (Signed)
Federica Allport is a 25 y.o. female presents to office today for annual physical exam examination.    Concerns today include: 1.Lactating for 2 months and breast enlarge  Occupation: Home health aid , Marital status: S, Substance use: No  Diet: generally healthy , Exercise: walking  Immunizations needed: Flu Vaccine: yes  Tdap Vaccine: no  - every 6yrs - (<3 lifetime doses or unknown): all wounds -- look up need for Tetanus IG - (>=3 lifetime doses): clean/minor wound if >44yrs from previous; all other wounds if >67yrs from previous Zoster Vaccine: no (those >50yo, once) Pneumonia Vaccine: no (those w/ risk factors) - (<18yr) Both: Immunocompromised, cochlear implant, CSF leak, asplenic, sickle cell, Chronic Renal Failure - (<67yr) PPSV-23 only: Heart dz, lung disease, DM, tobacco abuse, alcoholism, cirrhosis/liver disease. - (>72yr): PPSV13 then PPSV23 in 6-12mths;  - (>40yr): repeat PPSV23 once if pt received prior to 25yo and 1yrs have passed  Past Medical History:  Diagnosis Date  . Anxiety   . Dislocated knee 2008   R knee  . Miscarriage    Social History   Socioeconomic History  . Marital status: Significant Other    Spouse name: Not on file  . Number of children: 1  . Years of education: 16  . Highest education level: 10th grade  Occupational History  . Occupation: Home health aide  Tobacco Use  . Smoking status: Never Smoker  . Smokeless tobacco: Never Used  Vaping Use  . Vaping Use: Never used  Substance and Sexual Activity  . Alcohol use: No  . Drug use: Yes    Types: Marijuana    Comment: recently stopped  . Sexual activity: Yes    Birth control/protection: None  Other Topics Concern  . Not on file  Social History Narrative  . Not on file   Social Determinants of Health   Financial Resource Strain: Not on file  Food Insecurity: Not on file  Transportation Needs: Not on file  Physical Activity: Not on file  Stress: Not on file  Social  Connections: Not on file  Intimate Partner Violence: Not on file   Past Surgical History:  Procedure Laterality Date  . NO PAST SURGERIES     Family History  Problem Relation Age of Onset  . Diabetes Mother   . Thyroid disease Mother     Current Outpatient Medications:  .  hydrOXYzine (ATARAX/VISTARIL) 25 MG tablet, Take 1 tablet (25 mg total) by mouth every 4 (four) hours as needed for anxiety., Disp: 30 tablet, Rfl: 0 .  risperiDONE (RISPERDAL) 1 MG tablet, Take 1 tablet (1 mg total) by mouth daily., Disp: 30 tablet, Rfl: 0 .  risperiDONE (RISPERDAL) 4 MG tablet, Take 1 tablet (4 mg total) by mouth at bedtime., Disp: 30 tablet, Rfl: 0 .  traZODone (DESYREL) 100 MG tablet, Take 1 tablet (100 mg total) by mouth at bedtime as needed for sleep., Disp: 15 tablet, Rfl: 0  Allergies  Allergen Reactions  . Latex Itching     ROS: Review of Systems Pertinent items noted in HPI and remainder of comprehensive ROS otherwise negative.    Physical exam Physical Exam Vitals reviewed.  Constitutional:      Appearance: Normal appearance.  HENT:     Head: Normocephalic.     Right Ear: Tympanic membrane and external ear normal.     Left Ear: Tympanic membrane and external ear normal.     Nose: Nose normal.  Eyes:     Extraocular Movements: Extraocular  movements intact.     Pupils: Pupils are equal, round, and reactive to light.  Cardiovascular:     Rate and Rhythm: Normal rate and regular rhythm.  Pulmonary:     Effort: Pulmonary effort is normal.     Breath sounds: Normal breath sounds.  Abdominal:     General: Bowel sounds are normal.     Palpations: Abdomen is soft.  Musculoskeletal:        General: Normal range of motion.     Cervical back: Normal range of motion.  Skin:    General: Skin is warm and dry.  Neurological:     Mental Status: She is alert and oriented to person, place, and time.  Psychiatric:        Mood and Affect: Mood normal.        Behavior: Behavior  normal.        Thought Content: Thought content normal.        Judgment: Judgment normal.     Assessment/ Plan: [name] here for annual physical exam.   Diagnoses and all orders for this visit:  Encounter for annual health examination -     Flu Vaccine QUAD 36+ mos IM Completed   Inappropriate lactation -     Cancel: hCG, quantitative, pregnancy -     Beta hCG quant (ref lab)  Need for immunization against influenza -     Flu Vaccine QUAD 36+ mos IM   Counseled on healthy lifestyle choices, including diet (rich in fruits, vegetables and lean meats and low in salt and simple carbohydrates) and exercise (at least 30 minutes of moderate physical activity daily).  Patient to follow up in 1 year for annual exam or sooner if needed.  The above assessment and management plan was discussed with the patient. The patient verbalized understanding of and has agreed to the management plan. [name] aware to call the clinic if symptoms persist or worsen. [name] aware when to return to the clinic for a follow-up visit. Patient educated on when it is appropriate to go to the emergency department.   Gwinda Passe NP-C [address] [place] [address] [phone]

## 2020-11-17 NOTE — Patient Instructions (Signed)
Influenza, Adult Influenza is also called "the flu." It is an infection in the lungs, nose, and throat (respiratory tract). It spreads easily from person to person (is contagious). The flu causes symptoms that are like a cold, along with high fever and body aches. What are the causes? This condition is caused by the influenza virus. You can get the virus by:  Breathing in droplets that are in the air after a person infected with the flu coughed or sneezed.  Touching something that has the virus on it and then touching your mouth, nose, or eyes. What increases the risk? Certain things may make you more likely to get the flu. These include:  Not washing your hands often.  Having close contact with many people during cold and flu season.  Touching your mouth, eyes, or nose without first washing your hands.  Not getting a flu shot every year. You may have a higher risk for the flu, and serious problems, such as a lung infection (pneumonia), if you:  Are older than 65.  Are pregnant.  Have a weakened disease-fighting system (immune system) because of a disease or because you are taking certain medicines.  Have a long-term (chronic) condition, such as: ? Heart, kidney, or lung disease. ? Diabetes. ? Asthma.  Have a liver disorder.  Are very overweight (morbidly obese).  Have anemia. What are the signs or symptoms? Symptoms usually begin suddenly and last 4-14 days. They may include:  Fever and chills.  Headaches, body aches, or muscle aches.  Sore throat.  Cough.  Runny or stuffy (congested) nose.  Feeling discomfort in your chest.  Not wanting to eat as much as normal.  Feeling weak or tired.  Feeling dizzy.  Feeling sick to your stomach or throwing up. How is this treated? If the flu is found early, you can be treated with antiviral medicine. This can help to reduce how bad the illness is and how long it lasts. This may be given by mouth or through an IV  tube. Taking care of yourself at home can help your symptoms get better. Your doctor may want you to:  Take over-the-counter medicines.  Drink plenty of fluids. The flu often goes away on its own. If you have very bad symptoms or other problems, you may be treated in a hospital. Follow these instructions at home: Activity  Rest as needed. Get plenty of sleep.  Stay home from work or school as told by your doctor. ? Do not leave home until you do not have a fever for 24 hours without taking medicine. ? Leave home only to go to your doctor. Eating and drinking  Take an ORS (oral rehydration solution). This is a drink that is sold at pharmacies and stores.  Drink enough fluid to keep your pee pale yellow.  Drink clear fluids in small amounts as you are able. Clear fluids include: ? Water. ? Ice chips. ? Fruit juice mixed with water. ? Low-calorie sports drinks.  Eat bland foods that are easy to digest. Eat small amounts as you are able. These foods include: ? Bananas. ? Applesauce. ? Rice. ? Lean meats. ? Toast. ? Crackers.  Do not eat or drink: ? Fluids that have a lot of sugar or caffeine. ? Alcohol. ? Spicy or fatty foods. General instructions  Take over-the-counter and prescription medicines only as told by your doctor.  Use a cool mist humidifier to add moisture to the air in your home. This can make it   easier for you to breathe. ? When using a cool mist humidifier, clean it daily. Empty water and replace with clean water.  Cover your mouth and nose when you cough or sneeze.  Wash your hands with soap and water often and for at least 20 seconds. This is also important after you cough or sneeze. If you cannot use soap and water, use alcohol-based hand sanitizer.  Keep all follow-up visits.      How is this prevented?  Get a flu shot every year. You may get the flu shot in late summer, fall, or winter. Ask your doctor when you should get your flu shot.  Avoid  contact with people who are sick during fall and winter. This is cold and flu season.   Contact a doctor if:  You get new symptoms.  You have: ? Chest pain. ? Watery poop (diarrhea). ? A fever.  Your cough gets worse.  You start to have more mucus.  You feel sick to your stomach.  You throw up. Get help right away if you:  Have shortness of breath.  Have trouble breathing.  Have skin or nails that turn a bluish color.  Have very bad pain or stiffness in your neck.  Get a sudden headache.  Get sudden pain in your face or ear.  Cannot eat or drink without throwing up. These symptoms may represent a serious problem that is an emergency. Get medical help right away. Call your local emergency services (911 in the [place]  Do not wait to see if the symptoms will go away.  Do not drive yourself to the hospital. Summary  Influenza is also called "the flu." It is an infection in the lungs, nose, and throat. It spreads easily from person to person.  Take over-the-counter and prescription medicines only as told by your doctor.  Getting a flu shot every year is the best way to not get the flu. This information is not intended to replace advice given to you by your health care provider. Make sure you discuss any questions you have with your health care provider. Document Revised: 04/10/2020 Document Reviewed: 04/10/2020 Elsevier Patient Education  2021 [employer] Inc[employer]  

## 2020-11-17 NOTE — Progress Notes (Signed)
Pt states she is lactating alot

## 2020-11-18 LAB — BETA HCG QUANT (REF LAB): hCG Quant: <1 m[IU]/mL

## 2020-11-20 ENCOUNTER — Encounter (HOSPITAL_COMMUNITY): Payer: Self-pay

## 2020-11-20 ENCOUNTER — Ambulatory Visit (HOSPITAL_COMMUNITY)
Admission: RE | Admit: 2020-11-20 | Discharge: 2020-11-20 | Disposition: A | Discharge status: Home / Self Care | Payer: Medicaid Other | Source: Ambulatory Visit | Attending: Urgent Care | Admitting: Urgent Care

## 2020-11-20 ENCOUNTER — Other Ambulatory Visit: Payer: Self-pay

## 2020-11-20 VITALS — BP 114/67 | HR 92 | Temp 99.1°F | Resp 17

## 2020-11-20 DIAGNOSIS — Z7251 High risk heterosexual behavior: Secondary | ICD-10-CM

## 2020-11-20 DIAGNOSIS — Z8759 Personal history of other complications of pregnancy, childbirth and the puerperium: Secondary | ICD-10-CM

## 2020-11-20 DIAGNOSIS — Z202 Contact with and (suspected) exposure to infections with a predominantly sexual mode of transmission: Secondary | ICD-10-CM

## 2020-11-20 MED ORDER — DOXYCYCLINE HYCLATE 100 MG PO CAPS: 100.0000 mg | ORAL_CAPSULE | Freq: Two times a day (BID) | ORAL | 0 refills | Status: DC

## 2020-11-20 NOTE — ED Provider Notes (Signed)
[facility]   MRN: [id number] DOB: [birth year]  Subjective:   Erin Ponce is a 25 y.o. female presenting for  STI testing.  Patient was told that she was exposed to chlamydia.  Denies fever, nausea, vomiting, abdominal or pelvic pain, vaginal discharge, genital rash, dysuria, urinary frequency.  Patient has had a miscarriage at the beginning of this month.  Her last beta-hCG was 3 days ago and was negative.  She does have a history of severe major depressive disorder and is on medication for this.  She has an appointment with a psychologist coming up next week.  Denies SI, HI.  Patient would like to return to work, requires a note for her job.  No current facility-administered medications for this encounter.  Current Outpatient Medications:  .  hydrOXYzine (ATARAX/VISTARIL) 25 MG tablet, Take 1 tablet (25 mg total) by mouth every 4 (four) hours as needed for anxiety., Disp: 30 tablet, Rfl: 0 .  risperiDONE (RISPERDAL) 1 MG tablet, Take 1 tablet (1 mg total) by mouth daily., Disp: 30 tablet, Rfl: 0 .  risperiDONE (RISPERDAL) 4 MG tablet, Take 1 tablet (4 mg total) by mouth at bedtime., Disp: 30 tablet, Rfl: 0 .  traZODone (DESYREL) 100 MG tablet, Take 1 tablet (100 mg total) by mouth at bedtime as needed for sleep., Disp: 15 tablet, Rfl: 0   Allergies  Allergen Reactions  . Latex Itching    Past Medical History:  Diagnosis Date  . Anxiety   . Dislocated knee 2008   R knee  . Miscarriage      Past Surgical History:  Procedure Laterality Date  . NO PAST SURGERIES      Family History  Problem Relation Age of Onset  . Diabetes Mother   . Thyroid disease Mother     Social History   Tobacco Use  . Smoking status: Never Smoker  . Smokeless tobacco: Never Used  Vaping Use  . Vaping Use: Never used  Substance Use Topics  . Alcohol use: No  . Drug use: Yes    Types: Marijuana    Comment: recently stopped    ROS   Objective:   Vitals: BP 114/67    Pulse 92   Temp 99.1 F (37.3 C)   Resp 17   LMP 11/01/2020   SpO2 100%   Physical Exam Constitutional:      General: She is not in acute distress.    Appearance: Normal appearance. She is well-developed. She is not ill-appearing, toxic-appearing or diaphoretic.  HENT:     Head: Normocephalic and atraumatic.     Nose: Nose normal.     Mouth/Throat:     Mouth: Mucous membranes are moist.     Pharynx: Oropharynx is clear.  Eyes:     General: No scleral icterus.       Right eye: No discharge.        Left eye: No discharge.     Extraocular Movements: Extraocular movements intact.     Conjunctiva/sclera: Conjunctivae normal.     Pupils: Pupils are equal, round, and reactive to light.  Cardiovascular:     Rate and Rhythm: Normal rate.  Pulmonary:     Effort: Pulmonary effort is normal.  Skin:    General: Skin is warm and dry.  Neurological:     General: No focal deficit present.     Mental Status: She is alert and oriented to person, place, and time.  Psychiatric:  Mood and Affect: Mood normal.        Behavior: Behavior normal.        Thought Content: Thought content normal.        Judgment: Judgment normal.     Assessment and Plan :   PDMP not reviewed this encounter.  1. Exposure to chlamydia   2. Unprotected sex   3. History of miscarriage      We will treat empirically for chlamydia with doxycycline twice daily, labs pending.  Counseled on abstaining until she completes her treatment.  Follow-up with her psychologist/mental health provider. Counseled patient on potential for adverse effects with medications prescribed/recommended today, ER and return-to-clinic precautions discussed, patient verbalized understanding.    Wallis Bamberg, PA-C 11/20/20 1745

## 2020-11-20 NOTE — Discharge Instructions (Signed)
Avoid all forms of sexual intercourse (oral, vaginal, anal) for the next 7 days to avoid spreading/reinfecting. Return if symptoms worsen/do not resolve, you develop fever, abdominal pain, blood in your urine, or are re-exposed to an STI.  

## 2020-11-20 NOTE — ED Triage Notes (Signed)
Pt in requesting STD testing  States that she was told she might have chlamydia  Denies any vaginal sxs

## 2020-11-23 ENCOUNTER — Encounter (HOSPITAL_COMMUNITY): Payer: Self-pay | Admitting: Psychiatry

## 2020-11-23 ENCOUNTER — Telehealth (INDEPENDENT_AMBULATORY_CARE_PROVIDER_SITE_OTHER): Payer: No Payment, Other | Admitting: Psychiatry

## 2020-11-23 ENCOUNTER — Other Ambulatory Visit: Payer: Self-pay

## 2020-11-23 DIAGNOSIS — R7989 Other specified abnormal findings of blood chemistry: Secondary | ICD-10-CM

## 2020-11-23 DIAGNOSIS — F23 Brief psychotic disorder: Secondary | ICD-10-CM

## 2020-11-23 LAB — CERVICOVAGINAL ANCILLARY ONLY
Chlamydia: POSITIVE — AB
Comment: NEGATIVE
Comment: NEGATIVE
Comment: NORMAL
Neisseria Gonorrhea: NEGATIVE
Trichomonas: NEGATIVE

## 2020-11-23 MED ORDER — HYDROXYZINE HCL 25 MG PO TABS
25.0000 mg | ORAL_TABLET | ORAL | 2 refills | Status: DC | PRN
Start: 2020-11-23 — End: 2020-12-02

## 2020-11-23 MED ORDER — RISPERIDONE 4 MG PO TABS: 4.0000 mg | ORAL_TABLET | Freq: Every day | ORAL | 2 refills | Status: DC

## 2020-11-23 MED ORDER — RISPERIDONE 1 MG PO TABS: 1.0000 mg | ORAL_TABLET | Freq: Every day | ORAL | 2 refills | Status: DC

## 2020-11-23 MED ORDER — TRAZODONE HCL 100 MG PO TABS: 100.0000 mg | ORAL_TABLET | Freq: Every evening | ORAL | 2 refills | Status: DC | PRN
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
## 2020-11-24 ENCOUNTER — Telehealth (HOSPITAL_COMMUNITY): Payer: Self-pay | Admitting: *Deleted

## 2020-11-24 ENCOUNTER — Other Ambulatory Visit: Payer: Self-pay | Admitting: Psychiatry

## 2020-11-24 ENCOUNTER — Other Ambulatory Visit (HOSPITAL_COMMUNITY): Payer: Self-pay | Admitting: Psychiatry

## 2020-11-24 DIAGNOSIS — F23 Brief psychotic disorder: Secondary | ICD-10-CM

## 2020-11-24 LAB — PROLACTIN: Prolactin: 195 ng/mL — ABNORMAL HIGH (ref 4.8–23.3)

## 2020-11-24 MED ORDER — ARIPIPRAZOLE 5 MG PO TABS: 5.0000 mg | ORAL_TABLET | Freq: Every day | ORAL | 2 refills | Status: DC

## 2020-11-24 NOTE — Telephone Encounter (Signed)
VM left on writers phone asking to have Dr Doyne Keel call her, she has some questions after having a prolactin level drawn yesterday and seeing the results on my chart.

## 2020-11-24 NOTE — Telephone Encounter (Signed)
Provider called patient and discussed lab results.  At this time provider suggested Risperdal be discontinued due to increased prolactin level and leakage of milk from her breast.  She is agreeable to discontinue Risperdal 1 mg daily.  She will cut Risperdal 4 mg nightly pills in half for a week and then discontinue it.  She will then start Abilify 5 mg to help manage mood.  Writer informed patient that if she tolerates Abilify and finds it effective and LAI maybe considered.  Potential side effects of medication and risks vs benefits of treatment vs non-treatment were explained and discussed. All questions were answered.  No other concerns noted at this time.

## 2020-11-25 ENCOUNTER — Other Ambulatory Visit (HOSPITAL_COMMUNITY): Payer: Self-pay | Admitting: *Deleted

## 2020-11-25 ENCOUNTER — Telehealth (HOSPITAL_COMMUNITY): Payer: Self-pay | Admitting: *Deleted

## 2020-11-25 DIAGNOSIS — F23 Brief psychotic disorder: Secondary | ICD-10-CM

## 2020-11-25 MED ORDER — ARIPIPRAZOLE 5 MG PO TABS: 5.0000 mg | ORAL_TABLET | Freq: Every day | ORAL | 2 refills | Status: DC

## 2020-11-25 NOTE — Telephone Encounter (Signed)
Call from patient to clarify her meds, she had a recent med change. While on phone with her discussed the expense of her meds since she has no insurance suggested to her we [facility]. She [facility] from [facility], she is able to pick up her Trazodone and Vistaril today, it totalled 29.50 for both, but Abilify she will be starting next week is several hundred dollars. Will ask provider to use [facility] in the future for all of her meds. Will move her Abilify Rx to community today and will work with them and her for her to pick it up later this week.

## 2020-11-25 NOTE — Telephone Encounter (Signed)
Provider will utilize [facility]ness in future. Nursing staff informed writer that they contacted community health and wellness to price other medications.  Nursing staff reports that they will call patient with updated prices.  No other concerns noted at this time.

## 2020-11-26 ENCOUNTER — Other Ambulatory Visit (HOSPITAL_COMMUNITY): Payer: Self-pay | Admitting: *Deleted

## 2020-11-26 ENCOUNTER — Other Ambulatory Visit: Payer: Self-pay | Admitting: Psychiatry

## 2020-11-26 DIAGNOSIS — F23 Brief psychotic disorder: Secondary | ICD-10-CM

## 2020-11-26 MED ORDER — ARIPIPRAZOLE 5 MG PO TABS: 5.0000 mg | ORAL_TABLET | Freq: Every day | ORAL | 2 refills | Status: DC

## 2020-11-26 MED FILL — ARIPiprazole 5 MG TABS: 5 | 30 days supply | Qty: 30 | Fill #0

## 2020-11-27 ENCOUNTER — Telehealth (HOSPITAL_COMMUNITY): Payer: Self-pay | Admitting: *Deleted

## 2020-11-27 ENCOUNTER — Other Ambulatory Visit: Payer: Self-pay

## 2020-11-27 ENCOUNTER — Ambulatory Visit (INDEPENDENT_AMBULATORY_CARE_PROVIDER_SITE_OTHER): Payer: No Payment, Other | Admitting: Professional

## 2020-11-27 DIAGNOSIS — F330 Major depressive disorder, recurrent, mild: Secondary | ICD-10-CM

## 2020-11-27 NOTE — Telephone Encounter (Signed)
Have tried twice to reach her yesterday to arrange for her to get her Abilify affordable but vm not set up and not able to leave a message. This am VM left for writer stating she has been noticing twitching around her mouth which is concerning her. Also she noticied in her my chart it says she is a smoker and she is not and wants me to make that change. I will let her provider know about the twitching concerns.
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
## 2020-11-28 ENCOUNTER — Encounter (HOSPITAL_COMMUNITY): Payer: Self-pay | Admitting: Emergency Medicine

## 2020-11-28 ENCOUNTER — Emergency Department (HOSPITAL_COMMUNITY)
Admission: EM | Admit: 2020-11-28 | Discharge: 2020-11-28 | Disposition: A | Discharge status: Home / Self Care | Payer: Medicaid Other | Attending: Emergency Medicine | Admitting: Emergency Medicine

## 2020-11-28 ENCOUNTER — Emergency Department (HOSPITAL_COMMUNITY): Payer: Medicaid Other

## 2020-11-28 DIAGNOSIS — M25561 Pain in right knee: Secondary | ICD-10-CM | POA: Diagnosis present

## 2020-11-28 DIAGNOSIS — Z91040 Latex allergy status: Secondary | ICD-10-CM | POA: Insufficient documentation

## 2020-11-28 DIAGNOSIS — M25461 Effusion, right knee: Secondary | ICD-10-CM | POA: Diagnosis not present

## 2020-11-28 DIAGNOSIS — X509XXA Other and unspecified overexertion or strenuous movements or postures, initial encounter: Secondary | ICD-10-CM | POA: Insufficient documentation

## 2020-11-28 DIAGNOSIS — Y9301 Activity, walking, marching and hiking: Secondary | ICD-10-CM | POA: Diagnosis not present

## 2020-11-28 MED ORDER — ACETAMINOPHEN 500 MG PO TABS
1000.0000 mg | ORAL_TABLET | Freq: Once | ORAL | Status: AC
  Administered 2020-11-28: 1000 mg via ORAL
  Filled 2020-11-28: qty 2

## 2020-11-28 MED ORDER — NAPROXEN 250 MG PO TABS
500.0000 mg | ORAL_TABLET | Freq: Once | ORAL | Status: AC
  Administered 2020-11-28: 500 mg via ORAL
  Filled 2020-11-28: qty 2

## 2020-11-28 NOTE — ED Provider Notes (Addendum)
[facility] EMERGENCY DEPARTMENT Provider Note   CSN: [id number] Arrival date & time: 11/28/20  1825     History Chief Complaint  Patient presents with  . Knee Pain    Erin Ponce is a 25 y.o. female presents to the ED for evaluation of sudden onset right knee pain that began 2 hours prior to arrival.  She was walking and turned to walk backwards when she felt a pop in her right knee.  She thinks that her kneecap moved out of its place and popped back in.  Reports ongoing deep ache in the right knee that is worse when she extends her knee fully or puts weight on it.  Reports this has happened several times over the course of the last few years and typically she is able to pop it back in.  Her knee is then sore for a few days.  She has been told that she needs to follow-up with an orthopedist but has not been able to take.  Denies any other associated pain in the leg.  No distal tingling or numbness.  No direct trauma.  No interventions.  HPI     Past Medical History:  Diagnosis Date  . Anxiety   . Dislocated knee 2008   R knee  . Miscarriage     Patient Active Problem List   Diagnosis Date Noted  . Major depressive disorder, recurrent episode (HCC) 11/16/2020  . MDD (major depressive disorder), recurrent, severe, with psychosis (HCC) 03/31/2018  . Severe episode of recurrent major depressive disorder, with psychotic features (HCC)   . Psychosis (HCC) 03/30/2018  . Brief psychotic disorder (HCC) 03/30/2018    Past Surgical History:  Procedure Laterality Date  . NO PAST SURGERIES       OB History    Gravida  1   Para      Term      Preterm      AB      Living        SAB      IAB      Ectopic      Multiple      Live Births              Family History  Problem Relation Age of Onset  . Diabetes Mother   . Thyroid disease Mother     Social History   Tobacco Use  . Smoking status: Never Smoker  . Smokeless tobacco: Never  Used  Vaping Use  . Vaping Use: Never used  Substance Use Topics  . Alcohol use: No  . Drug use: Yes    Types: Marijuana    Comment: recently stopped    Home Medications Prior to Admission medications   Medication Sig Start Date End Date Taking? Authorizing Provider  ARIPiprazole (ABILIFY) 5 MG tablet Take 1 tablet (5 mg total) by mouth daily. 11/26/20   Shanna Cisco, NP  doxycycline (VIBRAMYCIN) 100 MG capsule Take 1 capsule (100 mg total) by mouth 2 (two) times daily. 11/20/20   Wallis Bamberg, PA-C  hydrOXYzine (ATARAX/VISTARIL) 25 MG tablet Take 1 tablet (25 mg total) by mouth every 4 (four) hours as needed for anxiety. 11/23/20   Shanna Cisco, NP  traZODone (DESYREL) 100 MG tablet Take 1 tablet (100 mg total) by mouth at bedtime as needed for sleep. 11/23/20   Shanna Cisco, NP  Cetirizine HCl 10 MG CAPS Take 1 capsule (10 mg total) by mouth daily. 07/07/19 04/08/20  Wieters, Hallie C, PA-C    Allergies    Latex  Review of Systems   Review of Systems  Musculoskeletal: Positive for arthralgias and gait problem.  All other systems reviewed and are negative.   Physical Exam Updated Vital Signs BP 112/81   Pulse 72   Temp 99.1 F (37.3 C) (Oral)   Resp 16   LMP 11/03/2020   SpO2 100%   Physical Exam Constitutional:      Appearance: She is well-developed.  HENT:     Head: Normocephalic.     Nose: Nose normal.  Eyes:     General: Lids are normal.  Cardiovascular:     Rate and Rhythm: Normal rate.     Pulses:          Popliteal pulses are 1+ on the right side.       Dorsalis pedis pulses are 1+ on the right side.       Posterior tibial pulses are 1+ on the right side.  Pulmonary:     Effort: Pulmonary effort is normal. No respiratory distress.  Musculoskeletal:     Cervical back: Normal range of motion.     Right knee: Decreased range of motion.     Comments:  Right knee: Tenderness in the medial, lateral joint lines, area below the patella.  Pain  with full extension. Patient able to extend knee fully but with pain. Patient able to hold leg above bed against gravity, with pain.  No popliteal space tenderness, fullness.  No tenderness over the quad tendon, tibial tuberosity. Calf soft non tender   Neurological:     Mental Status: She is alert.     Comments: Sensation and strength intact in RLE  Psychiatric:        Behavior: Behavior normal.     ED Results / Procedures / Treatments   Labs (all labs ordered are listed, but only abnormal results are displayed) Labs Reviewed - No data to display  EKG None  Radiology DG Knee Complete 4 Views Right  Result Date: 11/28/2020 CLINICAL DATA:  Right knee pain, EXAM: RIGHT KNEE - COMPLETE 4+ VIEW COMPARISON:  None. FINDINGS: No evidence of fracture or dislocation. There is edema in Hoffa's fat pad with a trace suprapatellar joint effusion. No evidence of arthropathy or other focal bone abnormality. Subcutaneous edema. IMPRESSION: No acute osseous abnormality of the right knee. Edema in Hoffa's fat pad with a trace suprapatellar joint effusion. Electronically Signed   By: Maudry Mayhew MD   On: 11/28/2020 19:56    Procedures Procedures   Medications Ordered in ED Medications  naproxen (NAPROSYN) tablet 500 mg (500 mg Oral Given 11/28/20 2127)  acetaminophen (TYLENOL) tablet 1,000 mg (1,000 mg Oral Given 11/28/20 2127)    ED Course  I have reviewed the triage vital signs and the nursing notes.  Pertinent labs & imaging results that were available during my care of the patient were reviewed by me and considered in my medical decision making (see chart for details).    MDM Rules/Calculators/A&P                          25 year old female presents to the ED with right knee pain.  Reports feeling her knee pop in and out of place.  Reports similar episode several times in the past.  Has not followed up with orthopedist.  No direct trauma.  X-ray ordered in triage shows no fracture or  dislocation but  there is edema in Hoffa's fat pad and trace suprapatellar joint effusion.  This fits the clinical picture.  Clinically sounds like patient likely dislocated her patella again and several times in the past.  Suspect chronic repetitive trauma to the knee as well from recurrent dislocations/sublux.  On exam she has almost full ROM, but pain with full extension of her knee.  Normal J tracking of the patella.  She is able to fully extend her leg against gravity and no signs to suggest quad rupture.  Extremity neurovascularly intact.  Will place in a knee immobilizer, crutches.  Recommended high-dose NSAIDs, ice.  She was instructed to follow-up with orthopedist the next 7 to 10 days for reevaluation.  May need strengthening exercises, PT.  Return precautions discussed.  [name] comfortable with this. Final Clinical Impression(s) / ED Diagnoses Final diagnoses:  Effusion of right knee    Rx / DC Orders ED Discharge Orders    None         Jerrell Mylar 11/28/20 2212    Alvira Monday, MD 11/30/20 [phone]

## 2020-11-28 NOTE — ED Notes (Signed)
Ortho tech called for knee immobilizer. °

## 2020-11-28 NOTE — Discharge Instructions (Signed)
You were seen in the emergency department for right knee pain  X-ray showed swelling of Hoffa's fat pad in your knee.  This is a nonspecific finding but usually reflect some type of injury inside the knee joint.  I think you may have dislocated your kneecap.  On the x-ray it showed that your kneecap is back in place.  Repetitive dislocations or subluxations of the kneecap can cause inflammation of the joint, chronic pain, inflammation and stability  Wear your knee brace 24/7 to provide compression and stability.  You may remove this for bathing.  Avoid putting any weight on your leg.  Use your crutches.  For pain and inflammation you can use a combination of ibuprofen and acetaminophen.  Take [phone] mg acetaminophen (tylenol) every 6 hours or 600 mg ibuprofen (advil, motrin) every 6 hours.  You can take these separately or combine them every 6 hours for maximum pain control. Do not exceed 4,000 mg acetaminophen or 2,400 mg ibuprofen in a 24 hour period.  Do not take ibuprofen containing products if you have history of kidney disease, ulcers, GI bleeding, severe acid reflux, or take a blood thinner.  Do not take acetaminophen if you have liver disease.   Call orthopedist and schedule an ER follow up appointment in 7-10 days.  You may need more imaging like CT or MRI of your knee to evaluate for any soft tissue injury.  You may need physical therapy, etc.  Return to the ED for worsening or new symptoms, increased knee swelling or pain, calf pain, distal leg tingling or numbness

## 2020-11-28 NOTE — Progress Notes (Signed)
Orthopedic Tech Progress Note Patient Details:  Erin Ponce Feb 05, 1996 [id number]  Ortho Devices Type of Ortho Device: Knee Immobilizer,Crutches Ortho Device/Splint Location: rle Ortho Device/Splint Interventions: Ordered,Application,Adjustment   Post Interventions Patient Tolerated: Well Instructions Provided: Care of device,Adjustment of device   [name] Post 11/28/2020, 9:48 PM

## 2020-11-28 NOTE — ED Triage Notes (Signed)
Pt states she dislocated her knee while walking just PTA.  C/o R knee pain.

## 2020-11-28 NOTE — ED Notes (Signed)
Ortho tech at bedside for knee immobilizer.

## 2020-11-30 ENCOUNTER — Other Ambulatory Visit (HOSPITAL_COMMUNITY): Payer: Self-pay | Admitting: Psychiatry

## 2020-11-30 ENCOUNTER — Encounter (INDEPENDENT_AMBULATORY_CARE_PROVIDER_SITE_OTHER): Payer: Self-pay | Admitting: Primary Care

## 2020-11-30 ENCOUNTER — Other Ambulatory Visit: Payer: Self-pay | Admitting: Psychiatry

## 2020-11-30 DIAGNOSIS — F23 Brief psychotic disorder: Secondary | ICD-10-CM

## 2020-11-30 MED ORDER — BENZTROPINE MESYLATE 0.5 MG PO TABS
0.5000 mg | ORAL_TABLET | Freq: Two times a day (BID) | ORAL | 2 refills | Status: DC
Start: 2020-11-30 — End: 2021-02-17

## 2020-11-30 NOTE — Telephone Encounter (Signed)
Provider called patient twice last week without success.  Provider called patient today and was informed that since discontinuing Risperdal she has had slight facial twitching around her mouth which she notes is bothersome.  She informed provider that she begin taking Abilify today and denies side effects.  Today she is agreeable to starting Cogentin 0.5 mg twice daily to help manage facial twitching. Potential side effects of medication and risks vs benefits of treatment vs non-treatment were explained and discussed. All questions were answered.  No other concerns noted at this time.

## 2020-12-02 ENCOUNTER — Telehealth (INDEPENDENT_AMBULATORY_CARE_PROVIDER_SITE_OTHER): Payer: Self-pay | Admitting: Primary Care

## 2020-12-02 ENCOUNTER — Other Ambulatory Visit (HOSPITAL_COMMUNITY): Payer: Self-pay | Admitting: Psychiatry

## 2020-12-02 DIAGNOSIS — F23 Brief psychotic disorder: Secondary | ICD-10-CM

## 2020-12-02 NOTE — Telephone Encounter (Signed)
received call from pt and [facility] with Radiology. Pt called to [facility]iology to have [place] completed, pt feels that she may be pregnant due to levels dropping. Called, spoke with office was advised that orders were not placed with Radiology due to nothing supporting positive pregnancy. Pt has had negative test. Pt was very upset that order has not been placed. Pt says that she understand the reason but wish that I not continue with explanation due to her not caring. Pt says that she don't care what the test says, pt says that her body feels different and would like to know what she need to do going forward to have [place] completed?   Pt would like a call back to be advised / discuss further.

## 2020-12-03 NOTE — Telephone Encounter (Signed)
Please schedule appointment.

## 2020-12-04 ENCOUNTER — Ambulatory Visit (HOSPITAL_COMMUNITY): Payer: Self-pay

## 2020-12-04 ENCOUNTER — Ambulatory Visit (HOSPITAL_COMMUNITY)
Admission: RE | Admit: 2020-12-04 | Discharge: 2020-12-04 | Disposition: A | Discharge status: Home / Self Care | Payer: BLUE CROSS/BLUE SHIELD | Source: Ambulatory Visit | Attending: Emergency Medicine | Admitting: Emergency Medicine

## 2020-12-04 ENCOUNTER — Other Ambulatory Visit: Payer: Self-pay

## 2020-12-04 ENCOUNTER — Encounter (HOSPITAL_COMMUNITY): Payer: Self-pay

## 2020-12-04 VITALS — BP 128/78 | HR 90 | Temp 98.2°F | Resp 18

## 2020-12-04 DIAGNOSIS — Z711 Person with feared health complaint in whom no diagnosis is made: Secondary | ICD-10-CM | POA: Diagnosis not present

## 2020-12-04 DIAGNOSIS — A749 Chlamydial infection, unspecified: Secondary | ICD-10-CM

## 2020-12-04 NOTE — Discharge Instructions (Addendum)
Please get retested for chlamydia 3 months after treatment to detect reinfection with chlamydia.  CDC recommendations that you do not need testing at this point in time.

## 2020-12-04 NOTE — ED Triage Notes (Signed)
Pt in for F/u vaginal swab  States she tested positive for chlamydia a few weeks ago and has finished taking her antibiotic but wants to make sure std is gone  Denies any sxs

## 2020-12-04 NOTE — ED Provider Notes (Signed)
HPI  SUBJECTIVE:  Erin Ponce is a 25 y.o. female who presents for chlamydia retesting. Patient was seen here on 3/18 and was found to have chlamydia.  She was treated with 1 week of doxycycline, finishing on 3/25.  She states that she took it as directed and finished all of the antibiotic.  She has no symptoms now.  she denies vaginal odor, discharge, itching, abdominal, back, pelvic pain.  She reports vaginal bleeding, but is currently on menses.  She states that she is not pregnant.  She states that her current partner was treated, however, she is wishing to get back with a previous partner and is concerned that she could pass chlamydia to him.  PMD: Grayce Sessions, NP   Past Medical History:  Diagnosis Date  . Anxiety   . Dislocated knee 2008   R knee  . Miscarriage     Past Surgical History:  Procedure Laterality Date  . NO PAST SURGERIES      Family History  Problem Relation Age of Onset  . Diabetes Mother   . Thyroid disease Mother     Social History   Tobacco Use  . Smoking status: Never Smoker  . Smokeless tobacco: Never Used  Vaping Use  . Vaping Use: Never used  Substance Use Topics  . Alcohol use: No  . Drug use: Yes    Types: Marijuana    Comment: recently stopped    No current facility-administered medications for this encounter.  Current Outpatient Medications:  .  ARIPiprazole (ABILIFY) 5 MG tablet, Take 1 tablet (5 mg total) by mouth daily., Disp: 30 tablet, Rfl: 2 .  benztropine (COGENTIN) 0.5 MG tablet, Take 1 tablet (0.5 mg total) by mouth 2 (two) times daily., Disp: 60 tablet, Rfl: 2 .  doxycycline (VIBRAMYCIN) 100 MG capsule, Take 1 capsule (100 mg total) by mouth 2 (two) times daily., Disp: 14 capsule, Rfl: 0 .  hydrOXYzine (ATARAX/VISTARIL) 25 MG tablet, TAKE 1 TABLET BY MOUTH EVERY 4 HOURS AS NEEDED FOR ANXIETY, Disp: 540 tablet, Rfl: 1 .  traZODone (DESYREL) 100 MG tablet, Take 1 tablet (100 mg total) by mouth at bedtime as needed for  sleep., Disp: 30 tablet, Rfl: 2  Allergies  Allergen Reactions  . Latex Itching     ROS  As noted in HPI.   Physical Exam  BP 128/78   Pulse 90   Temp 98.2 F (36.8 C)   Resp 18   LMP 11/29/2020   SpO2 99%   Constitutional: Well developed, well nourished, no acute distress Eyes:  EOMI, conjunctiva normal bilaterally HENT: Normocephalic, atraumatic,mucus membranes moist Respiratory: Normal inspiratory effort Cardiovascular: Normal rate GI: nondistended skin: No rash, skin intact Musculoskeletal: no deformities Neurologic: Alert & oriented x 3, no focal neuro deficits Psychiatric: Speech and behavior appropriate   ED Course   Medications - No data to display  No orders of the defined types were placed in this encounter.   No results found for this or any previous visit (from the past 24 hour(s)). No results found.  ED Clinical Impression  1. Chlamydia infection   2. Concern about STD in female without diagnosis      ED Assessment/Plan  Previous records, labs reviewed as noted in HPI.  Discussed with patient that the CDC recommendations is to retest all patients 3 months after treatment regardless of other sex partners are documented and even if a test of cure 4 weeks after treatment completion was negative.  Test of cure is not routinely warranted, unless she is pregnant (patient denies pregnancy), has persistent symptoms (patient denies any symptom), concern for nonadherence to regimen (patient states that she finished everything as directed), use of a regimen with inferior cure rates (she was treated appropriately with 1 week of doxycycline) or was treated with azithromycin with her at high risk for rectal infection.  This also does not apply to her.  In this case, test of cure should be performed no sooner than 4 weeks after treatment is completed.  Discussed with her that chlamydia organisms may still be detectable several weeks after treatment despite an  absence of viable organisms.  There are no indications for retesting today.  Discussed this with patient.  Advised her that she needs to be retested around 6/25.  Advised her to give the medication another week to 10 days to work.    No orders of the defined types were placed in this encounter.      ?    Domenick Gong, MD 12/04/20 1013

## 2020-12-09 NOTE — Telephone Encounter (Signed)
I have tried to contact the patient, I was unable to leave a voicemail.

## 2020-12-10 ENCOUNTER — Ambulatory Visit (HOSPITAL_COMMUNITY): Payer: Self-pay

## 2020-12-15 ENCOUNTER — Telehealth (HOSPITAL_COMMUNITY): Payer: Self-pay | Admitting: Professional

## 2020-12-15 ENCOUNTER — Ambulatory Visit (INDEPENDENT_AMBULATORY_CARE_PROVIDER_SITE_OTHER): Payer: Medicaid Other | Admitting: Professional

## 2020-12-15 ENCOUNTER — Other Ambulatory Visit: Payer: Self-pay

## 2020-12-15 DIAGNOSIS — F23 Brief psychotic disorder: Secondary | ICD-10-CM

## 2020-12-15 NOTE — Telephone Encounter (Signed)
Cln opened call log to call pt due to pt not being in virtual apt. As cln was dialing, pt arrived.

## 2020-12-15 NOTE — Progress Notes (Signed)
Virtual Visit via Telephone Note  I connected with [name] on 12/15/20 at  9:00 AM EDT by telephone and verified that I am speaking with the correct person using two identifiers.  Location: Patient: Home Provider: Clinical Home Office   I discussed the limitations, risks, security and privacy concerns of performing an evaluation and management service by telephone and the availability of in person appointments. I also discussed with the patient that there may be a patient responsible charge related to this service. The patient expressed understanding and agreed to proceed.  Follow Up Instructions:    I discussed the assessment and treatment plan with the patient. The patient was provided an opportunity to ask questions and all were answered. The patient agreed with the plan and demonstrated an understanding of the instructions.   The patient was advised to call back or seek an in-person evaluation if the symptoms worsen or if the condition fails to improve as anticipated.  I provided 17 minutes of non-face-to-face time during this encounter.   Quinn Axe, [facility]    THERAPIST PROGRESS NOTE  Session Time: 9  Participation Level: Active  Behavioral Response: CasualAlertEuthymic  Type of Therapy: Individual Therapy  Treatment Goals addressed: Coping  Interventions: CBT, Solution Focused, Strength-based, Supportive and Reframing  Summary: Rylen Hou is a 25 y.o. female who presents with depression and anxiety symptoms, as follow-up from inpt. Pt reports "things have been pretty good." Pt reports eating and sleeping. 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 Pt reports she starts classes May 9. Pt reports her daughter has visited 3x. Pt reports she has not contacted the [facility] yet. Pt shares she is feeling good and doesn't have anything she wants to work on today. Cln reminds pt to use skills discussed to manage  symptoms if they arise and to reframe thoughts around powerlessness. Pt reports she wants to take a break from counseling while this cln is out on maternity leave. Cln and pt will have one more appointment scheduled before this cln goes out. Pt denies SI/HI/AVH.  Suicidal/Homicidal: Nowithout intent/plan  Therapist Response: Cln asked how the client has been since discharge from inpt. Cln asked open ended questions about positive and/or negative changes that have occurred since last seen. Cln used active listening to understand and validate patient. Cln used CBT to help with reframing. Cln assisted with scheduling next appointment.   Plan: Return again in 2 weeks.  Diagnosis: MDD    Quinn Axe, [facility] 12/15/2020

## 2020-12-17 ENCOUNTER — Other Ambulatory Visit (HOSPITAL_COMMUNITY): Payer: Self-pay | Admitting: Psychiatry

## 2020-12-17 DIAGNOSIS — F23 Brief psychotic disorder: Secondary | ICD-10-CM

## 2020-12-21 ENCOUNTER — Ambulatory Visit: Payer: Self-pay

## 2020-12-21 ENCOUNTER — Ambulatory Visit (INDEPENDENT_AMBULATORY_CARE_PROVIDER_SITE_OTHER): Payer: Self-pay | Admitting: *Deleted

## 2020-12-21 NOTE — Telephone Encounter (Signed)
Pt. Asking if she should have her COVID 19 vaccination with cold symptoms. Pt. Will wait until symptom free.

## 2020-12-21 NOTE — Telephone Encounter (Signed)
Attempted twice to return pt's call regarding getting a covid vaccine while having cold symptoms.   No voicemail set up.   No answer.

## 2020-12-28 ENCOUNTER — Ambulatory Visit: Payer: Medicaid Other

## 2020-12-29 ENCOUNTER — Ambulatory Visit (HOSPITAL_COMMUNITY): Payer: Medicaid Other | Admitting: Professional

## 2020-12-29 ENCOUNTER — Other Ambulatory Visit: Payer: Self-pay

## 2020-12-29 ENCOUNTER — Telehealth (HOSPITAL_COMMUNITY): Payer: Self-pay | Admitting: Professional

## 2020-12-29 NOTE — Telephone Encounter (Signed)
Cln called pt due to pt not being in to virtual apt. Pt reprots she is at work and forgot about the appointment. Pt reports she feels good about starting a break from therapy while this cln is out on leave. Pt denies SI/HI/AVH. Cln tells pt to call office to get an appointment if needed while this cln is out. Pt agrees.

## 2021-01-21 ENCOUNTER — Other Ambulatory Visit (HOSPITAL_COMMUNITY)
Admission: RE | Admit: 2021-01-21 | Discharge: 2021-01-21 | Disposition: A | Discharge status: Home / Self Care | Payer: Medicaid Other | Source: Ambulatory Visit | Attending: Primary Care | Admitting: Primary Care

## 2021-01-21 ENCOUNTER — Other Ambulatory Visit: Payer: Self-pay

## 2021-01-21 ENCOUNTER — Encounter (INDEPENDENT_AMBULATORY_CARE_PROVIDER_SITE_OTHER): Payer: Self-pay | Admitting: Primary Care

## 2021-01-21 ENCOUNTER — Ambulatory Visit (INDEPENDENT_AMBULATORY_CARE_PROVIDER_SITE_OTHER): Payer: BLUE CROSS/BLUE SHIELD | Admitting: Primary Care

## 2021-01-21 VITALS — BP 114/77 | HR 89 | Ht 62.0 in | Wt 147.0 lb

## 2021-01-21 DIAGNOSIS — Z020 Encounter for examination for admission to educational institution: Secondary | ICD-10-CM | POA: Insufficient documentation

## 2021-01-21 DIAGNOSIS — Z124 Encounter for screening for malignant neoplasm of cervix: Secondary | ICD-10-CM | POA: Diagnosis not present

## 2021-01-21 NOTE — Progress Notes (Signed)
   WELL-WOMAN PHYSICAL & PAP Patient name: Erin Ponce MRN [id number]  Date of birth: 1995-09-25 Chief Complaint:   Gynecologic Exam and Annual Exam  History of Present Illness:   Erin Ponce is a 25 y.o. G1P0 female being seen today for a routine well-woman exam.  Current complaints: None [id number] P Erin Ponce      does desire labs No LMP recorded. (Menstrual status: Irregular Periods). The current method of family planning is none.   Review of Systems:   Pertinent items are noted in HPI Denies any headaches, blurred vision, fatigue, shortness of breath, chest pain, abdominal pain, abnormal vaginal discharge/itching/odor/irritation, problems with periods, bowel movements, urination, or intercourse unless otherwise stated above. Pertinent History Reviewed:  Reviewed past medical,surgical, social and family history.  Reviewed problem list, medications and allergies. Physical Assessment:   Vitals:   01/21/21 1502  BP: 114/77  Pulse: 89  SpO2: 100%  Weight: 147 lb (66.7 kg)  Height: 5\' 2"  (1.575 m)  Body mass index is 26.89 kg/m.        Physical Examination:   General appearance - well appearing, and in no distress  Mental status - alert, oriented to person, place, and time  Psych:  She has a normal mood and affect  Skin - warm and dry, normal color, no suspicious lesions noted  Chest - effort normal, all lung fields clear to auscultation bilaterally  Heart - normal rate and regular rhythm  Neck:  midline trachea, no thyromegaly or nodules  Breasts - breasts appear normal, no suspicious masses, no skin or nipple changes or axillary nodes   Educated patient on proper self breast examination and had patient to demonstrate SBE.  Abdomen - soft, nontender, nondistended, no masses or organomegaly  Pelvic - VULVA: normal appearing vulva with no masses, tenderness or lesions   VAGINA: normal appearing vagina with normal color and discharge, no lesions   CERVIX: normal  appearing cervix without discharge or lesions, no CMT   Thin prep pap is done    UTERUS: uterus is felt to be normal size, shape, consistency and nontender    ADNEXA: No adnexal masses or tenderness noted.  Extremities:  No swelling or varicosities noted  No results found for this or any previous visit (from the past 24 hour(s)).  Assessment & Plan:  1) Well-Woman Exam with Pap Erin Ponce was seen today for gynecologic exam and annual exam.  Pap smear for cervical cancer screening Completed   Encounter for school history and physical examination -     Measles/Mumps/Rubella Immunity; Future -     Varicella Zoster Abs, IgG/IgM; Future -     Hepatitis B core antibody, IgM; Future -     Cytology - PAP -     Cervicovaginal ancillary only   No orders of the defined types were placed in this encounter.   Meds: No orders of the defined types were placed in this encounter.   Follow-up: No follow-ups on file.  , NP 01/21/2021 3:45 PM

## 2021-01-25 ENCOUNTER — Other Ambulatory Visit: Payer: Self-pay | Admitting: Family Medicine

## 2021-01-25 ENCOUNTER — Other Ambulatory Visit: Payer: Self-pay

## 2021-01-25 ENCOUNTER — Ambulatory Visit: Payer: Medicaid Other | Attending: Primary Care

## 2021-01-25 DIAGNOSIS — Z020 Encounter for examination for admission to educational institution: Secondary | ICD-10-CM

## 2021-01-25 LAB — CERVICOVAGINAL ANCILLARY ONLY
Bacterial Vaginitis (gardnerella): POSITIVE — AB
Candida Glabrata: NEGATIVE
Candida Vaginitis: NEGATIVE
Chlamydia: NEGATIVE
Comment: NEGATIVE
Comment: NEGATIVE
Comment: NEGATIVE
Comment: NEGATIVE
Comment: NEGATIVE
Comment: NORMAL
Neisseria Gonorrhea: NEGATIVE
Trichomonas: NEGATIVE

## 2021-01-25 LAB — CYTOLOGY - PAP
Comment: NEGATIVE
Diagnosis: NEGATIVE
High risk HPV: NEGATIVE

## 2021-01-25 MED ORDER — METRONIDAZOLE 0.75 % VA GEL
1.0000 | Freq: Every day | VAGINAL | 0 refills | Status: DC
  Filled 2021-01-25: qty 70, 7d supply, fill #0

## 2021-01-25 NOTE — Progress Notes (Signed)
Pt arrived at clinic to get PPD test for school PPD test was placed and pt was informed to return in 48 hours for reading.

## 2021-01-26 LAB — VARICELLA ZOSTER ABS, IGG/IGM
Varicella IgM: <0.91 index (ref 0.00–0.90)
Varicella zoster IgG: 2555 index (ref >165)

## 2021-01-26 LAB — MEASLES/MUMPS/RUBELLA IMMUNITY
MUMPS ABS, IGG: 112 AU/mL (ref >10.9)
RUBEOLA AB, IGG: 76 AU/mL (ref >16.4)
Rubella Antibodies, IGG: 3.46 index (ref >0.99)

## 2021-01-26 LAB — HEPATITIS B CORE ANTIBODY, IGM: Hep B C IgM: NEGATIVE

## 2021-01-28 LAB — QUANTIFERON-TB GOLD PLUS
QuantiFERON Mitogen Value: >10 IU/mL
QuantiFERON Nil Value: 0.1 IU/mL
QuantiFERON TB1 Ag Value: 0.12 IU/mL
QuantiFERON TB2 Ag Value: 0.12 IU/mL
QuantiFERON-TB Gold Plus: NEGATIVE

## 2021-02-02 ENCOUNTER — Other Ambulatory Visit: Payer: Self-pay

## 2021-02-04 ENCOUNTER — Encounter (INDEPENDENT_AMBULATORY_CARE_PROVIDER_SITE_OTHER): Payer: Self-pay | Admitting: Primary Care

## 2021-02-17 ENCOUNTER — Other Ambulatory Visit: Payer: Self-pay

## 2021-02-17 ENCOUNTER — Telehealth (INDEPENDENT_AMBULATORY_CARE_PROVIDER_SITE_OTHER): Payer: Medicaid Other | Admitting: Psychiatry

## 2021-02-17 ENCOUNTER — Encounter (HOSPITAL_COMMUNITY): Payer: Self-pay | Admitting: Psychiatry

## 2021-02-17 DIAGNOSIS — F23 Brief psychotic disorder: Secondary | ICD-10-CM | POA: Diagnosis not present

## 2021-02-17 MED ORDER — BENZTROPINE MESYLATE 0.5 MG PO TABS
0.5000 mg | ORAL_TABLET | Freq: Two times a day (BID) | ORAL | 2 refills | Status: DC
  Filled 2021-02-17: qty 60, 30d supply, fill #0

## 2021-02-17 MED ORDER — TRAZODONE HCL 100 MG PO TABS
100.0000 mg | ORAL_TABLET | Freq: Every evening | ORAL | 2 refills | Status: DC | PRN
  Filled 2021-02-17: qty 30, 30d supply, fill #0

## 2021-02-17 MED ORDER — RISPERIDONE 1 MG PO TABS
1.0000 mg | ORAL_TABLET | Freq: Every day | ORAL | 2 refills | Status: DC
Start: 2021-02-17 — End: 2021-05-20
  Filled 2021-02-17: qty 30, 30d supply, fill #0

## 2021-02-17 MED ORDER — HYDROXYZINE HCL 25 MG PO TABS
ORAL_TABLET | ORAL | 2 refills | Status: DC
  Filled 2021-02-17: qty 90, 15d supply, fill #0

## 2021-02-17 NOTE — Progress Notes (Signed)
BH MD/PA/NP OP Progress Note Virtual Visit via Telephone Note  I connected with Erin Ponce on 02/17/21 at 10:00 AM EDT by telephone and verified that I am speaking with the correct person using two identifiers.  Location: Patient: home Provider: Clinic   I discussed the limitations, risks, security and privacy concerns of performing an evaluation and management service by telephone and the availability of in person appointments. I also discussed with the patient that there may be a patient responsible charge related to this service. The patient expressed understanding and agreed to proceed.   I provided 30 minutes of non-face-to-face time during this encounter.  02/17/2021 10:51 AM Erin Ponce  MRN:  [id number]  Chief Complaint: "I don't see a difference with the Abilify and I want to restart Risperdal"  HPI: 25 year old female seen today for initial psychiatric evaluation.   She has a psychiatric history of brief psychotic disorder, depression, and psychosis.  She is currently managed on hydroxyzine 25 mg 3 times daily as needed, Abilify 5 mg daily,and trazodone 100 mg nightly as needed.  She notes her medications are somewhat effective in managing her psychiatric conditions.   Today patient was unable to login virtually so her assessment was done over the phone.  During exam she was pleasant, cooperative, and engaged in conversation.  She informed provider that she she dislikes Abilify.  She notes that she does not see a difference in it and reports that she would like to restart risperidone.  Provider informed patient that while on Risperdal she had increased prolactin levels, facial twitching, and breast leakage.  She notes that she finds Risperdal works best for her and notes that she is aware of the side effects however would really like to restart it.  She informed provider that she has been unable to control her anger recently and having racing thoughts.  She denies other  symptoms of mania.  She does note that at times she feels paranoid however reports that its regarding her concern for her daughter.  She notes that her daughter has been spending more time with her father while she goes to school.  She notes that she generally gets her daughter on the weekends however notes that she did not see her for the last 2 weeks because she went on a girls trip.  Today she denies SI/HI/VAH.  She endorses adequate sleep noting that she sleeps 8 to 10 hours nightly.  She endorses adequate appetite.   Today provider conducted a GAD-7 and patient scored a 8, at her last visit she scored a 0.  Provider also conducted a PHQ-9 and patient scored a 13, at her last visit she scored a 2.  She    Patient notes that she smoked marijuana 2 weeks ago to help manage her irritability and anxiety.  She notes that since that time she has not consumed any illegal substances or alcohol.     Provider offered patient an increase in Abilify or switching antipsychotics.  She however notes that she does not want to try anything else and became tearful when she Risperdal was not offered.  Provider informed patient that Risperdal could be restarted in a very low dose of 1 mg nightly.  Provider informed patient that she is concerned about side effects that she has had in the past.  She endorsed understanding however notes that she is willing to take any risk as she found Risperdal most effective.  She will continue all other medications as prescribed.  No  other concerns noted at this time.  Visit Diagnosis:    ICD-10-CM   1. Brief psychotic disorder (HCC)  F23 risperiDONE (RISPERDAL) 1 MG tablet    hydrOXYzine (ATARAX/VISTARIL) 25 MG tablet    traZODone (DESYREL) 100 MG tablet    benztropine (COGENTIN) 0.5 MG tablet      Past Psychiatric History: brief psychotic disorder, depression, and psychosis  Past Medical History:  Past Medical History:  Diagnosis Date   Anxiety    Dislocated knee 2008   R  knee   Miscarriage     Past Surgical History:  Procedure Laterality Date   NO PAST SURGERIES      Family Psychiatric History: Denies  Family History:  Family History  Problem Relation Age of Onset   Diabetes Mother    Thyroid disease Mother     Social History:  Social History   Socioeconomic History   Marital status: Significant Other    Spouse name: Not on file   Number of children: 1   Years of education: 12   Highest education level: 10th grade  Occupational History   Occupation: Home health aide  Tobacco Use   Smoking status: Never   Smokeless tobacco: Never  Vaping Use   Vaping Use: Never used  Substance and Sexual Activity   Alcohol use: No   Drug use: Yes    Types: Marijuana    Comment: recently stopped   Sexual activity: Yes    Birth control/protection: None  Other Topics Concern   Not on file  Social History Narrative   Not on file   Social Determinants of Health   Financial Resource Strain: Not on file  Food Insecurity: Not on file  Transportation Needs: Not on file  Physical Activity: Not on file  Stress: Not on file  Social Connections: Not on file    Allergies:  Allergies  Allergen Reactions   Latex Itching    Metabolic Disorder Labs: Lab Results  Component Value Date   HGBA1C 5.7 (H) 10/28/2020   MPG 116.89 10/28/2020   MPG 108.28 04/03/2018   Lab Results  Component Value Date   PROLACTIN 195.0 (H) 11/23/2020   PROLACTIN 160.1 (H) 04/03/2018   Lab Results  Component Value Date   CHOL 122 10/28/2020   TRIG 60 10/28/2020   HDL 38 (L) 10/28/2020   CHOLHDL 3.2 10/28/2020   VLDL 12 10/28/2020   LDLCALC 72 10/28/2020   LDLCALC 79 04/03/2018   Lab Results  Component Value Date   TSH 0.935 10/28/2020   TSH 1.228 03/31/2018    Therapeutic Level Labs: No results found for: LITHIUM No results found for: VALPROATE No components found for:  CBMZ  Current Medications: Current Outpatient Medications  Medication Sig  Dispense Refill   risperiDONE (RISPERDAL) 1 MG tablet Take 1 tablet (1 mg total) by mouth at bedtime. 30 tablet 2   benztropine (COGENTIN) 0.5 MG tablet Take 1 tablet (0.5 mg total) by mouth 2 (two) times daily. 60 tablet 2   doxycycline (VIBRAMYCIN) 100 MG capsule Take 1 capsule (100 mg total) by mouth 2 (two) times daily. 14 capsule 0   hydrOXYzine (ATARAX/VISTARIL) 25 MG tablet TAKE 1 TABLET BY MOUTH EVERY 4 HOURS AS NEEDED FOR ANXIETY 90 tablet 2   metroNIDAZOLE (METROGEL VAGINAL) 0.75 % vaginal gel Place 1 Applicatorful vaginally at bedtime. 70 g 0   traZODone (DESYREL) 100 MG tablet Take 1 tablet (100 mg total) by mouth at bedtime as needed. for sleep 30 tablet 2  No current facility-administered medications for this visit.     Musculoskeletal: Strength & Muscle Tone:  Unable to assess dud to telehealth visit Gait & Station:  Unable to assess dud to telehealth visit Patient leans: N/A  Psychiatric Specialty Exam: Review of Systems  There were no vitals taken for this visit.There is no height or weight on file to calculate BMI.  General Appearance:  Unable to assess dud to telehealth visit  Eye Contact:   Unable to assess dud to telehealth visit  Speech:  Clear and Coherent and Normal Rate  Volume:  Normal  Mood:  Anxious, Depressed, and Irritable  Affect:  Appropriate and Congruent  Thought Process:  Coherent, Goal Directed, and Linear  Orientation:  Full (Time, Place, and Person)  Thought Content: WDL and Logical   Suicidal Thoughts:  No  Homicidal Thoughts:  No  Memory:  Immediate;   Good Recent;   Good Remote;   Good  Judgement:  Good  Insight:  Good  Psychomotor Activity:   Unable to assess dud to telehealth visit  Concentration:  Concentration: Good and Attention Span: Good  Recall:  Good  Fund of Knowledge: Good  Language: Good  Akathisia:  No  Handed:  Right  AIMS (if indicated): not done  Assets:  Communication Skills Desire for Improvement Financial  Resources/Insurance Housing Leisure Time Physical Health Social Support Vocational/Educational  ADL's:  Intact  Cognition: WNL  Sleep:  Good   Screenings: AIMS    Flowsheet Row [facility]  AIMS Total Score 0 0      AUDIT    Flowsheet Row [facility]) [facility]  Alcohol Use Disorder Identification Test Final Score (AUDIT) 0 0      GAD-7    Flowsheet Row Video Visit from 02/17/2021 in [facility] Office Visit from 01/21/2021 in [facility] Video Visit from 11/23/2020 in [facility] Office Visit from 11/17/2020 in [facility] Integrated Behavioral Health from 11/12/2020 in [facility] at [facility]  Total GAD-7 Score 8 2 0 0 19      PHQ2-9    Flowsheet Row Video Visit from 02/17/2021 in [facility] Office Visit from 01/21/2021 in [facility] Video Visit from 11/23/2020 in [facility] Office Visit from 11/17/2020 in [facility] Counselor from 11/16/2020 in [facility]  PHQ-2 Total Score 2 1 0 0 0  PHQ-9 Total Score 13 5 2  0 0      Flowsheet Row ED from 12/04/2020 in [facility] Video Visit from 11/23/2020 in [facility] ED from 11/20/2020 in [facility] RISK CATEGORY Error: Question 6 not populated No Risk Error: Question 6 not populated        Assessment and Plan: Patient endorses increased irritability, anxiety, and depression.  She informed [facility] that  she found Abilify ineffective.  Provider offered patient an increase in Abilify or switching antipsychotics.  She however notes that she does not want to try anything else and became tearful when she Risperdal was not offered.  Provider informed patient that Risperdal could be restarted in  a very low dose of 1 mg nightly.  Provider informed patient that she is concerned about side effects that she has had in the past.  She endorsed understanding however notes that she is willing to take any risk as she found Risperdal most effective.  1. Brief psychotic disorder (HCC)  Continue- hydrOXYzine (ATARAX/VISTARIL) 25 MG tablet; TAKE 1 TABLET BY MOUTH EVERY 4 HOURS AS NEEDED FOR ANXIETY  Dispense: 90 tablet; Refill: 2 Continue- traZODone (DESYREL) 100 MG tablet; Take 1 tablet (100 mg total) by mouth at bedtime as needed. for sleep  Dispense: 30 tablet; Refill: 2 Continue- benztropine (COGENTIN) 0.5 MG tablet; Take 1 tablet (0.5 mg total) by mouth 2 (two) times daily.  Dispense: 60 tablet; Refill: 2 Restart- risperiDONE (RISPERDAL) 1 MG tablet; Take 1 tablet (1 mg total) by mouth at bedtime.  Dispense: 30 tablet; Refill: 2  Follow-up in 3 months Follow-up with therapy    Shanna CiscoBrittney E Versia Mignogna, NP 02/17/2021, 10:51 AM

## 2021-02-24 ENCOUNTER — Other Ambulatory Visit: Payer: Self-pay

## 2021-03-08 ENCOUNTER — Emergency Department (HOSPITAL_COMMUNITY): Payer: Medicaid Other

## 2021-03-08 ENCOUNTER — Encounter (HOSPITAL_COMMUNITY): Payer: Self-pay | Admitting: Emergency Medicine

## 2021-03-08 ENCOUNTER — Other Ambulatory Visit: Payer: Self-pay

## 2021-03-08 ENCOUNTER — Emergency Department (HOSPITAL_COMMUNITY)
Admission: EM | Admit: 2021-03-08 | Discharge: 2021-03-08 | Disposition: A | Discharge status: Home / Self Care | Payer: Medicaid Other | Attending: Emergency Medicine | Admitting: Emergency Medicine

## 2021-03-08 DIAGNOSIS — S61411A Laceration without foreign body of right hand, initial encounter: Secondary | ICD-10-CM

## 2021-03-08 DIAGNOSIS — Z91040 Latex allergy status: Secondary | ICD-10-CM | POA: Insufficient documentation

## 2021-03-08 DIAGNOSIS — W25XXXA Contact with sharp glass, initial encounter: Secondary | ICD-10-CM | POA: Diagnosis not present

## 2021-03-08 DIAGNOSIS — S61216A Laceration without foreign body of right little finger without damage to nail, initial encounter: Secondary | ICD-10-CM | POA: Insufficient documentation

## 2021-03-08 DIAGNOSIS — S6991XA Unspecified injury of right wrist, hand and finger(s), initial encounter: Secondary | ICD-10-CM | POA: Diagnosis present

## 2021-03-08 NOTE — ED Provider Notes (Signed)
[facility]-EMERGENCY DEPT Provider Note   CSN: [id number] Arrival date & time: 03/08/21  1126     History Chief Complaint  Patient presents with   Extremity Laceration    Erin Ponce is a 25 y.o. female.  [name] a 25 year old female with a history of depression presenting today after she cut her right hand on broken glass.  She was pushing on a window when it broke and cut her hand.  She is concerned that there may be glass in the palm of her hand.  The cut is on her palm and on her right fifth finger.  Due to an injury she had several years ago she can never been and her left finger but there is no new functional changes.  Tetanus shot is up-to-date.  The history is provided by the patient.      Past Medical History:  Diagnosis Date   Anxiety    Dislocated knee 2008   R knee   Miscarriage     Patient Active Problem List   Diagnosis Date Noted   Major depressive disorder, recurrent episode (HCC) 11/16/2020   MDD (major depressive disorder), recurrent, severe, with psychosis (HCC) 03/31/2018   Severe episode of recurrent major depressive disorder, with psychotic features (HCC)    Psychosis (HCC) 03/30/2018   Brief psychotic disorder (HCC) 03/30/2018    Past Surgical History:  Procedure Laterality Date   NO PAST SURGERIES       OB History     Gravida  1   Para      Term      Preterm      AB      Living         SAB      IAB      Ectopic      Multiple      Live Births              Family History  Problem Relation Age of Onset   Diabetes Mother    Thyroid disease Mother     Social History   Tobacco Use   Smoking status: Never   Smokeless tobacco: Never  Vaping Use   Vaping Use: Never used  Substance Use Topics   Alcohol use: No   Drug use: Yes    Types: Marijuana    Comment: recently stopped    Home Medications Prior to Admission medications   Medication Sig Start Date End Date Taking? Authorizing  Provider  benztropine (COGENTIN) 0.5 MG tablet Take 1 tablet (0.5 mg total) by mouth 2 (two) times daily. 02/17/21   Shanna Cisco, NP  doxycycline (VIBRAMYCIN) 100 MG capsule Take 1 capsule (100 mg total) by mouth 2 (two) times daily. 11/20/20   Wallis Bamberg, PA-C  hydrOXYzine (ATARAX/VISTARIL) 25 MG tablet TAKE 1 TABLET BY MOUTH EVERY 4 HOURS AS NEEDED FOR ANXIETY 02/17/21   Toy Cookey E, NP  metroNIDAZOLE (METROGEL VAGINAL) 0.75 % vaginal gel Place 1 Applicatorful vaginally at bedtime. 01/25/21   Hoy Register, MD  risperiDONE (RISPERDAL) 1 MG tablet Take 1 tablet (1 mg total) by mouth at bedtime. 02/17/21   Shanna Cisco, NP  traZODone (DESYREL) 100 MG tablet Take 1 tablet (100 mg total) by mouth at bedtime as needed. for sleep 02/17/21   Toy Cookey E, NP  Cetirizine HCl 10 MG CAPS Take 1 capsule (10 mg total) by mouth daily. 07/07/19 04/08/20  Wieters, Hallie C, PA-C    Allergies  Latex  Review of Systems   Review of Systems  All other systems reviewed and are negative.  Physical Exam Updated Vital Signs BP 133/89 (BP Location: Left Arm)   Pulse (!) 117   Temp 99 F (37.2 C) (Oral)   Resp 18   Ht 5\' 2"  (1.575 m)   Wt 67 kg   SpO2 99%   BMI 27.02 kg/m   Physical Exam Vitals and nursing note reviewed.  Constitutional:      General: She is not in acute distress.    Appearance: Normal appearance. She is normal weight.  HENT:     Head: Normocephalic.  Cardiovascular:     Rate and Rhythm: Normal rate.  Pulmonary:     Effort: Pulmonary effort is normal.  Musculoskeletal:        General: Signs of injury present.       Hands:     Comments: Able to flex and extend fully at the right fifth MCP joint but unable to flex at the PIP and DIP joint which patient reports is chronic.  Skin:    General: Skin is warm.  Neurological:     Mental Status: She is alert and oriented to person, place, and time. Mental status is at baseline.  Psychiatric:        Mood  and Affect: Mood normal.        Behavior: Behavior normal.    ED Results / Procedures / Treatments   Labs (all labs ordered are listed, but only abnormal results are displayed) Labs Reviewed - No data to display  EKG None  Radiology DG Hand Complete Right  Result Date: 03/08/2021 CLINICAL DATA:  Pt reported she cut her right little finger and her palm on glass of a window, states she had a previous stitch in her pinky and it might have been removed. EXAM: RIGHT HAND - COMPLETE 3+ VIEW COMPARISON:  None. FINDINGS: No fracture.  No bone lesion. Joints normally spaced and aligned. Soft tissues are unremarkable.  No radiopaque foreign body. IMPRESSION: Negative. Electronically Signed   By: 05/09/2021 M.D.   On: 03/08/2021 14:09    Procedures Procedures  LACERATION REPAIR Performed by: 05/09/2021 Authorized by: [employer] [facility] Consent: Verbal consent obtained. Risks and benefits: risks, benefits and alternatives were discussed Consent given by: patient Patient identity confirmed: provided demographic data Prepped and Draped in normal sterile fashion Wound explored  Laceration Location: right hand  Laceration Length: 0.5cm on the right fifth finger and 1cm on the right palm  No Foreign Bodies seen or palpated  Anesthesia: local infiltration  Local anesthetic: lidocaine 1% with epinephrine  Anesthetic total: none Amount of cleaning: standard  Skin closure: dermabond  Patient tolerance: Patient tolerated the procedure well with no immediate complications.    Medications Ordered in ED Medications - No data to display  ED Course  I have reviewed the triage vital signs and the nursing notes.  Pertinent labs & imaging results that were available during my care of the patient were reviewed by me and considered in my medical decision making (see chart for details).    MDM Rules/Calculators/A&P                          Patient presenting today with complaint of  laceration to her hand after she accidentally put it through a broken window.  We will get plain films to ensure no retained glass.  Lacerations are small and can  be repaired with Dermabond.  Tetanus shot is up-to-date.  X-ray neg for FB. Dermabond as above.  MDM   Amount and/or Complexity of Data Reviewed Tests in the radiology section of CPT: ordered and reviewed Independent visualization of images, tracings, or specimens: yes     Final Clinical Impression(s) / ED Diagnoses Final diagnoses:  Laceration of right hand without foreign body, initial encounter    Rx / DC Orders ED Discharge Orders     None        Gwyneth Sprout, MD 03/08/21 1432

## 2021-03-08 NOTE — ED Triage Notes (Addendum)
Cut her R pinky and palm on glass of a window, states she had a previous stitch in her pinky and it might have been removed.

## 2021-03-08 NOTE — ED Notes (Signed)
Laceration cleaned with wound spray and normal saline, small glass shard removed from palm.

## 2021-04-16 ENCOUNTER — Ambulatory Visit (INDEPENDENT_AMBULATORY_CARE_PROVIDER_SITE_OTHER): Payer: BLUE CROSS/BLUE SHIELD | Admitting: Primary Care

## 2021-04-23 ENCOUNTER — Other Ambulatory Visit: Payer: Self-pay

## 2021-04-26 ENCOUNTER — Ambulatory Visit (INDEPENDENT_AMBULATORY_CARE_PROVIDER_SITE_OTHER): Payer: Medicaid Other | Admitting: Primary Care

## 2021-04-26 ENCOUNTER — Other Ambulatory Visit: Payer: Self-pay

## 2021-04-26 ENCOUNTER — Encounter (INDEPENDENT_AMBULATORY_CARE_PROVIDER_SITE_OTHER): Payer: Self-pay | Admitting: Primary Care

## 2021-04-26 VITALS — BP 125/83 | HR 76 | Temp 97.7°F | Ht 62.0 in | Wt 143.8 lb

## 2021-04-26 DIAGNOSIS — Z3042 Encounter for surveillance of injectable contraceptive: Secondary | ICD-10-CM | POA: Diagnosis not present

## 2021-04-26 DIAGNOSIS — Z6826 Body mass index (BMI) 26.0-26.9, adult: Secondary | ICD-10-CM

## 2021-04-26 LAB — POCT URINE PREGNANCY: Preg Test, Ur: NEGATIVE

## 2021-04-26 MED ORDER — MEDROXYPROGESTERONE ACETATE 150 MG/ML IM SUSP: 150.0000 mg | Freq: Once | INTRAMUSCULAR | Status: DC

## 2021-04-26 MED ORDER — MEDROXYPROGESTERONE ACETATE 150 MG/ML IM SUSY
150.0000 mg | PREFILLED_SYRINGE | Freq: Once | INTRAMUSCULAR | Status: AC
  Administered 2021-04-26: 150 mg via INTRAMUSCULAR

## 2021-04-26 NOTE — Patient Instructions (Addendum)
HPV Vaccine Information for Parents  HPV (human papillomavirus) is a common virus that spreads easily from person to person through skin-to-skin or sexual contact. There are many types of HPV viruses. They can cause warts in the genitals (genital or mucosal HPV), or on the hands or feet (cutaneous or nonmucosal HPV). Some genital HPV types are considered high-risk and may cause cancer. Your child can get a vaccination to help prevent certain HPV infections that can cause cancer as well as those types that cause genital and anal warts. The vaccine is safe and effective. It is recommended for boys and girls at about 40-42 years of age. Getting the vaccine at this age (before he or she is sexually active) gives your child the best protection from HPV infectionthrough adulthood. How can HPV affect my child? HPV infection can cause: Genital warts. Mouth or throat cancer. Anal cancer. Cervical, vulvar, or vaginal cancer. Penile cancer. During pregnancy, HPV infection can be passed to the baby. This infection cancause warts to develop in a baby's throat and windpipe. What actions can I take to lower my child's risk for HPV? To lower your child's risk for genital HPV infection, have him or her get the HPV vaccine before becoming sexually active. The best time for vaccination is between ages 68 and 40, though it can be given to children as young as 40 years old. If your child gets the first dose before age 10, the vaccination can be given as 2 shots, 6-12 months apart. In some situations, 3 doses are needed. If your child starts the vaccine before age 6 but does not have a second dose within 6-12 months after the first dose, he or she will need 3 doses to complete the vaccination. When your child has the first dose, it is important to make an appointment for the next shot and keep the appointment. Teens who are not vaccinated before age 79 will need 3 doses, within six months of the first dose. If your child  has a weak immune system, he or she may need 3 doses. Young adults can also get the vaccination, even if they are already sexually active and even if they have already been infected with HPV. The vaccination can still help prevent the types of cancer-causing HPV that a person has notbeen infected with. What are the risks and benefits of the HPV vaccine? Benefits The main benefit of getting vaccinated is to prevent certain cancers, including: Cervical, vulvar, and vaginal cancer in females. Penile cancer in males. Oral and anal cancer in both males and females. The risk of these cancers is lower if your child gets vaccinated before he orshe becomes sexually active. The vaccine also prevents genital warts caused by HPV. Risks The risks, although low, include side effects or reactions to the vaccine. Very few reactions have been reported, but they can include: Soreness, redness, or swelling at the injection site. Dizziness or headache. Fever. Who should not get the HPV vaccine or should wait to get it? Some children should not get the HPV vaccine or should wait. Discuss the risks and benefits of the vaccine with your child's health care provider if your child: Has had a severe allergic reaction to other vaccinations. Is allergic to yeast. Has a fever. Has had a recent illness. Is pregnant or may be pregnant. Where to find more information Centers for Disease Control and Prevention: https://www.boyd-meyer.org/ American Academy of Pediatrics: healthychildren.org Summary HPV (human papillomavirus) is a common virus that spreads from person  to person through skin-to-skin or sexual contact. It can spread during vaginal, anal, or oral sex. Your child can get a vaccination to prevent HPV infection and cancer. It is best to get the vaccination before becoming sexually active. The HPV vaccine can protect your child from genital warts and certain types of cancer, including cancer of the cervix, throat, mouth, vulva,  vagina, anus, and penis. The HPV vaccine is both safe and effective. The best time for boys and girls to get the vaccination is when they are between ages 58 and 68. This information is not intended to replace advice given to you by your health care provider. Make sure you discuss any questions you have with your healthcare provider. Document Revised: 04/28/2020 Document Reviewed: 04/07/2020 Elsevier Patient Education  2022 [employer] Contraceptive Injection A contraceptive injection is a shot that prevents pregnancy. It is also called a birth control shot. The shot contains the hormone progestin, which prevents pregnancy by: Stopping the ovaries from releasing eggs. Thickening cervical mucus to prevent sperm from entering the cervix. Thinning the lining of the uterus to prevent a fertilized egg from attaching to the uterus. Contraceptive injections are given under the skin (subcutaneous) or into a muscle (intramuscular). For these shots to work, you must get one of them every 3 months (12-13weeks) from a health care provider. Tell a health care provider about: Any allergies you have. All medicines you are taking, including vitamins, herbs, eye drops, creams, and over-the-counter medicines. Any blood disorders you have. Any medical conditions you have. Whether you are pregnant or may be pregnant. What are the risks? Generally, this is a safe procedure. However, problems may occur, including: Mood changes or depression. Loss of bone density (osteoporosis) after long-term use. Blood clots. This is rare. Higher risk of an egg being fertilized outside your uterus (ectopic pregnancy).This is rare. What happens before the procedure? Your health care provider may do a routine physical exam. You may have a test to make sure you are not pregnant. What happens during the procedure?  The area where the shot will be given will be cleaned and sanitized with alcohol. A needle will be inserted  into a muscle in your upper arm or buttock, or into the skin of your thigh or abdomen. The needle will be attached to a syringe with the medicine inside of it. The medicine will be pushed through the syringe and injected into your body. A small bandage (dressing) may be placed over the injection site. What can I expect after the procedure? After the procedure, it is common to have: Soreness around the injection site for a couple of days. Irregular menstrual bleeding. Weight gain. Breast tenderness. Headaches. Discomfort in your abdomen. Ask your health care provider whether you need to use an added method of birth control (backup contraception), such as a condom, sponge, or spermicide. If the first shot is given 1-7 days after the start of your last menstrual period, you will not need backup contraception. If the first shot is given at any other time during your menstrual cycle, you should avoid having sex. If you do have sex, you will need to use backup contraception for 7 days after you receive the shot. Follow these instructions at home: General instructions Take over-the-counter and prescription medicines only as told by your health care provider. Do not rub or massage the injection site. Track your menstrual periods so you will know if they become irregular. Always use a condom to protect against sexually transmitted  infections (STIs). Make sure you schedule an appointment in time for your next shot and mark it on your calendar. You must get an injection every 3 months (12-13 weeks) to prevent pregnancy. Lifestyle Do not use any products that contain nicotine or tobacco. These products include cigarettes, chewing tobacco, and vaping devices, such as e-cigarettes. If you need help quitting, ask your health care provider. Eat foods that are high in calcium and vitamin D, such as milk, cheese, and salmon. Doing this may help with any loss in bone density caused by the contraceptive  injection. Ask your health care provider for dietary recommendations. Contact a health care provider if you: Have nausea or vomiting. Have abnormal vaginal discharge or bleeding. Miss a menstrual period or think you might be pregnant. Experience mood changes or depression. Feel dizzy or light-headed. Have leg pain. Get help right away if you: Have chest pain or cough up blood. Have shortness of breath. Have a severe headache that does not go away. Have numbness in any part of your body. Have slurred speech or vision problems. Have vaginal bleeding that is abnormally heavy or does not stop, or you have severe pain in your abdomen. Have depression that does not get better with treatment. If you ever feel like you may hurt yourself or others, or have thoughts about taking your own life, get help right away. Go to your nearest emergency department or: Call your local emergency services (911 in the [place] Call a suicide crisis helpline, such as the National Suicide Prevention Lifeline at 901-722-6585. This is open 24 hours a day in the [place] Text the Crisis Text Line at (830) 106-6511 (in the [place] Summary A contraceptive injection is a shot that prevents pregnancy. It is also called the birth control shot. The shot is given under the skin (subcutaneous) or into a muscle (intramuscular). After this procedure, it is common to have soreness around the injection site for a couple of days. To prevent pregnancy, the shot must be given by a health care provider every 3 months (12-13 weeks). After you have the shot, ask your health care provider whether you need to use an added method of birth control (backup contraception), such as a condom, sponge, or spermicide. This information is not intended to replace advice given to you by your health care provider. Make sure you discuss any questions you have with your healthcare provider. Document Revised: 03/02/2020 Document Reviewed: 03/02/2020 Elsevier Patient  Education  2022 [employer].

## 2021-04-26 NOTE — Progress Notes (Signed)
Established Patient Office Visit  Subjective:  Patient ID: Erin Ponce, female    DOB: August 04, 1996  Age: 25 y.o. MRN: [id number]  CC:  Chief Complaint  Patient presents with   Contraception    depo    HPI Erin Ponce is a 25 year female presents for birth control management . Previously discussed and she has decided on depo- discussed side effects and not for preventing STD- recommend condoms. She voices no other problems or concerns .  Past Medical History:  Diagnosis Date   Anxiety    Dislocated knee 2008   R knee   Miscarriage     Past Surgical History:  Procedure Laterality Date   NO PAST SURGERIES      Family History  Problem Relation Age of Onset   Diabetes Mother    Thyroid disease Mother     Social History   Socioeconomic History   Marital status: Significant Other    Spouse name: Not on file   Number of children: 1   Years of education: 28   Highest education level: 10th grade  Occupational History   Occupation: Home health aide  Tobacco Use   Smoking status: Never   Smokeless tobacco: Never  Vaping Use   Vaping Use: Never used  Substance and Sexual Activity   Alcohol use: No   Drug use: Yes    Types: Marijuana    Comment: recently stopped   Sexual activity: Yes    Birth control/protection: None  Other Topics Concern   Not on file  Social History Narrative   Not on file   Social Determinants of Health   Financial Resource Strain: Not on file  Food Insecurity: Not on file  Transportation Needs: Not on file  Physical Activity: Not on file  Stress: Not on file  Social Connections: Not on file  Intimate Partner Violence: Not on file    Outpatient Medications Prior to Visit  Medication Sig Dispense Refill   hydrOXYzine (ATARAX/VISTARIL) 25 MG tablet TAKE 1 TABLET BY MOUTH EVERY 4 HOURS AS NEEDED FOR ANXIETY 90 tablet 2   traZODone (DESYREL) 100 MG tablet Take 1 tablet (100 mg total) by mouth at bedtime as needed. for sleep 30  tablet 2   benztropine (COGENTIN) 0.5 MG tablet Take 1 tablet (0.5 mg total) by mouth 2 (two) times daily. (Patient not taking: Reported on 04/26/2021) 60 tablet 2   risperiDONE (RISPERDAL) 1 MG tablet Take 1 tablet (1 mg total) by mouth at bedtime. (Patient not taking: Reported on 04/26/2021) 30 tablet 2   doxycycline (VIBRAMYCIN) 100 MG capsule Take 1 capsule (100 mg total) by mouth 2 (two) times daily. 14 capsule 0   metroNIDAZOLE (METROGEL VAGINAL) 0.75 % vaginal gel Place 1 Applicatorful vaginally at bedtime. 70 g 0   No facility-administered medications prior to visit.    Allergies  Allergen Reactions   Latex Itching    ROS Review of Systems  All other systems reviewed and are negative.    Objective:    Physical Exam Vitals reviewed.  HENT:     Head: Normocephalic.     Right Ear: Tympanic membrane and external ear normal.     Left Ear: There is impacted cerumen.     Nose: Nose normal.  Eyes:     Extraocular Movements: Extraocular movements intact.     Pupils: Pupils are equal, round, and reactive to light.  Cardiovascular:     Rate and Rhythm: Normal rate and regular rhythm.  Pulmonary:     Effort: Pulmonary effort is normal.     Breath sounds: Normal breath sounds.  Abdominal:     General: Bowel sounds are normal. There is distension.     Palpations: Abdomen is soft.  Musculoskeletal:        General: Normal range of motion.     Cervical back: Normal range of motion and neck supple.  Skin:    General: Skin is warm and dry.  Neurological:     Mental Status: She is alert and oriented to person, place, and time.  Psychiatric:        Mood and Affect: Mood normal.        Behavior: Behavior normal.        Thought Content: Thought content normal.        Judgment: Judgment normal.    BP 125/83 (BP Location: Right Arm, Patient Position: Sitting, Cuff Size: Normal)   Pulse 76   Temp 97.7 F (36.5 C) (Temporal)   Ht 5\' 2"  (1.575 m)   Wt 143 lb 12.8 oz (65.2 kg)    LMP 04/17/2021 (Exact Date)   SpO2 98%   BMI 26.30 kg/m  Wt Readings from Last 3 Encounters:  04/26/21 143 lb 12.8 oz (65.2 kg)  03/08/21 147 lb 11.3 oz (67 kg)  01/21/21 147 lb (66.7 kg)     Health Maintenance Due  Topic Date Due   HPV VACCINES (1 - 2-dose series) Never done   COVID-19 Vaccine (2 - Pfizer series) 11/12/2020   INFLUENZA VACCINE  04/05/2021       Topic Date Due   HPV VACCINES (1 - 2-dose series) Never done    Lab Results  Component Value Date   TSH 0.935 10/28/2020   Lab Results  Component Value Date   WBC 5.6 10/24/2020   HGB 12.9 10/24/2020   HCT 41.8 10/24/2020   MCV 88.9 10/24/2020   PLT 307 10/24/2020   Lab Results  Component Value Date   NA 136 10/24/2020   K 3.8 10/24/2020   CO2 21 (L) 10/24/2020   GLUCOSE 92 10/24/2020   BUN 6 10/24/2020   CREATININE 0.85 10/24/2020   BILITOT 0.6 10/24/2020   ALKPHOS 46 10/24/2020   AST 17 10/24/2020   ALT 11 10/24/2020   PROT 7.6 10/24/2020   ALBUMIN 4.7 10/24/2020   CALCIUM 9.6 10/24/2020   ANIONGAP 10 10/24/2020   Lab Results  Component Value Date   CHOL 122 10/28/2020   Lab Results  Component Value Date   HDL 38 (L) 10/28/2020   Lab Results  Component Value Date   LDLCALC 72 10/28/2020   Lab Results  Component Value Date   TRIG 60 10/28/2020   Lab Results  Component Value Date   CHOLHDL 3.2 10/28/2020   Lab Results  Component Value Date   HGBA1C 5.7 (H) 10/28/2020      Assessment & Plan:  Erin Ponce was seen today for contraception.  Diagnoses and all orders for this visit:  Encounter for Depo-Provera contraception Information provided on AVS and discussed in visit side effects. -     POCT urine pregnancy -     medroxyPROGESTERone Acetate SUSY 150 mg  BMI 26.0-26.9,adult Recommend monitoring carbohydrates  Acceptable weigh increase exercise      Follow-up: Return for NOV 7-nov 21 depo injection .    Nov 23, NP

## 2021-05-07 ENCOUNTER — Telehealth (INDEPENDENT_AMBULATORY_CARE_PROVIDER_SITE_OTHER): Payer: Self-pay

## 2021-05-07 NOTE — Telephone Encounter (Signed)
Patient called requesting for [facility] call her back. Patient states her employer received the forms that were filled out by PCP but on the forms PCP placed a comment that the patient would need to have another tb test done.  Please advice [phone]  Patient stated she would bring a new form on Tuesday to have PCP filled out

## 2021-05-20 ENCOUNTER — Encounter (HOSPITAL_COMMUNITY): Payer: Self-pay | Admitting: Psychiatry

## 2021-05-20 ENCOUNTER — Other Ambulatory Visit: Payer: Self-pay

## 2021-05-20 ENCOUNTER — Telehealth (INDEPENDENT_AMBULATORY_CARE_PROVIDER_SITE_OTHER): Payer: Medicaid Other | Admitting: Psychiatry

## 2021-05-20 DIAGNOSIS — F23 Brief psychotic disorder: Secondary | ICD-10-CM | POA: Diagnosis not present

## 2021-05-20 MED ORDER — TRAZODONE HCL 100 MG PO TABS
100.0000 mg | ORAL_TABLET | Freq: Every evening | ORAL | 3 refills | Status: DC | PRN
  Filled 2021-05-20 – 2021-05-28 (×2): qty 30, 30d supply, fill #0

## 2021-05-20 MED ORDER — ARIPIPRAZOLE 5 MG PO TABS
5.0000 mg | ORAL_TABLET | Freq: Every day | ORAL | 3 refills | Status: DC
  Filled 2021-05-20 – 2021-05-28 (×2): qty 30, 30d supply, fill #0

## 2021-05-20 MED ORDER — HYDROXYZINE HCL 25 MG PO TABS
ORAL_TABLET | ORAL | 3 refills | Status: DC
  Filled 2021-05-20 – 2021-05-28 (×2): qty 90, 15d supply, fill #0

## 2021-05-20 NOTE — Progress Notes (Signed)
BH MD/PA/NP OP Progress Note Virtual Visit via Video Note  I connected with Erin Ponce on 05/20/21 at  1:00 PM EDT by a video enabled telemedicine application and verified that I am speaking with the correct person using two identifiers.  Location: Patient: Home Provider: Clinic   I discussed the limitations of evaluation and management by telemedicine and the availability of in person appointments. The patient expressed understanding and agreed to proceed.  I provided 30 minutes of non-face-to-face time during this encounter.    05/20/2021 1:14 PM Erin Ponce  MRN:  [id number]  Chief Complaint: "I could not pick up the Risperdal and have only been taking Abilify and Hydroxyzine"  HPI: 25 year old female seen today for follow up psychiatric evaluation.   She has a psychiatric history of brief psychotic disorder, depression, and psychosis.  She is currently managed on hydroxyzine 25 mg 3 times daily as needed, Abilify 5 mg daily,and trazodone 100 mg nightly as needed.  She notes her medications are somewhat effective in managing her psychiatric conditions.   Today she appears for a telehealth visit. She is pleasant, cooperative, engaged in conversation, and maintaining good eye contact. She says she is doing well and is currently working at a school. Previously she had been taking risperidone which was causing galactorrhea, so this was discontinued, and she was switched to Abilify. She did not like the abilify and wanted to switch back to risperidone (1 mg) but was unable to pick it up so continued with the Abilify.  She now notes that she finds Abilify effective and reports that she would like to continue it.  She notes that her symptoms have been well controlled with current medication regimen, and denies symptoms of mania.  She does note that she sometimes worries about bad things happening.   Patient informed writer that her anxiety and depression are well managed.  Provider  conducted a GAD 7 and patient scored a 7, at her last visit she scored an 8.  Provider also conducted a PHQ-9 and patient scored a 5, at her last visit she scored a 13.  Since she has run out of her Trazadone, she says during the week she only gets 2 hours of sleep per night and notes her sleep schedule is from 4:30-6:30 am. She reports her appetite has been fluctuating and her weight has been between 139-147. She says some days she isn't hungry at all and others she eats larger portions. She reports that she recently started  depo Provera and denies being pregnant.  At this time Risperdal not restarted.  Patient will continue Abilify 5 mg and restart trazodone 100 mg nightly as needed.  We will continue all other medications as prescribed.     Visit Diagnosis:    ICD-10-CM   1. Brief psychotic disorder (HCC)  F23 hydrOXYzine (ATARAX/VISTARIL) 25 MG tablet    traZODone (DESYREL) 100 MG tablet      Past Psychiatric History: brief psychotic disorder, depression, and psychosis  Past Medical History:  Past Medical History:  Diagnosis Date   Anxiety    Dislocated knee 2008   R knee   Miscarriage     Past Surgical History:  Procedure Laterality Date   NO PAST SURGERIES      Family Psychiatric History: Denies  Family History:  Family History  Problem Relation Age of Onset   Diabetes Mother    Thyroid disease Mother     Social History:  Social History   Socioeconomic History  Marital status: Significant Other    Spouse name: Not on file   Number of children: 1   Years of education: 41   Highest education level: 10th grade  Occupational History   Occupation: Home health aide  Tobacco Use   Smoking status: Never   Smokeless tobacco: Never  Vaping Use   Vaping Use: Never used  Substance and Sexual Activity   Alcohol use: No   Drug use: Yes    Types: Marijuana    Comment: recently stopped   Sexual activity: Yes    Birth control/protection: None  Other Topics Concern    Not on file  Social History Narrative   Not on file   Social Determinants of Health   Financial Resource Strain: Not on file  Food Insecurity: Not on file  Transportation Needs: Not on file  Physical Activity: Not on file  Stress: Not on file  Social Connections: Not on file    Allergies:  Allergies  Allergen Reactions   Latex Itching    Metabolic Disorder Labs: Lab Results  Component Value Date   HGBA1C 5.7 (H) 10/28/2020   MPG 116.89 10/28/2020   MPG 108.28 04/03/2018   Lab Results  Component Value Date   PROLACTIN 195.0 (H) 11/23/2020   PROLACTIN 160.1 (H) 04/03/2018   Lab Results  Component Value Date   CHOL 122 10/28/2020   TRIG 60 10/28/2020   HDL 38 (L) 10/28/2020   CHOLHDL 3.2 10/28/2020   VLDL 12 10/28/2020   LDLCALC 72 10/28/2020   LDLCALC 79 04/03/2018   Lab Results  Component Value Date   TSH 0.935 10/28/2020   TSH 1.228 03/31/2018    Therapeutic Level Labs: No results found for: LITHIUM No results found for: VALPROATE No components found for:  CBMZ  Current Medications: Current Outpatient Medications  Medication Sig Dispense Refill   ARIPiprazole (ABILIFY) 5 MG tablet Take 1 tablet (5 mg total) by mouth daily. 30 tablet 3   benztropine (COGENTIN) 0.5 MG tablet Take 1 tablet (0.5 mg total) by mouth 2 (two) times daily. (Patient not taking: Reported on 04/26/2021) 60 tablet 2   hydrOXYzine (ATARAX/VISTARIL) 25 MG tablet TAKE 1 TABLET BY MOUTH EVERY 4 HOURS AS NEEDED FOR ANXIETY 90 tablet 3   traZODone (DESYREL) 100 MG tablet Take 1 tablet (100 mg total) by mouth at bedtime as needed. for sleep 30 tablet 3   No current facility-administered medications for this visit.     Musculoskeletal: Strength & Muscle Tone:  Unable to assess dud to telehealth visit Gait & Station:  Unable to assess dud to telehealth visit Patient leans: N/A  Psychiatric Specialty Exam: Review of Systems  There were no vitals taken for this visit.There is no height  or weight on file to calculate BMI.  General Appearance: Well Groomed  Eye Contact:  Good  Speech:  Clear and Coherent and Normal Rate  Volume:  Normal  Mood:  Euthymic  Affect:  Appropriate and Congruent  Thought Process:  Coherent, Goal Directed, and Linear  Orientation:  Full (Time, Place, and Person)  Thought Content: WDL and Logical   Suicidal Thoughts:  No  Homicidal Thoughts:  No  Memory:  Immediate;   Good Recent;   Good Remote;   Good  Judgement:  Good  Insight:  Good  Psychomotor Activity:  Normal  Concentration:  Concentration: Good and Attention Span: Good  Recall:  Good  Fund of Knowledge: Good  Language: Good  Akathisia:  No  Handed:  Right  AIMS (if indicated): not done  Assets:  Communication Skills Desire for Improvement Financial Resources/Insurance Housing Leisure Time Physical Health Social Support Vocational/Educational  ADL's:  Intact  Cognition: WNL  Sleep:  Poor   Screenings: AIMS    Flowsheet Row [facility]  AIMS Total Score 0 0      AUDIT    Flowsheet Row [facility]) [facility] Ad[facility] ([facility]  Alcohol Use Disorder Identification Test Final Score (AUDIT) 0 0      GAD-7    Flowsheet Row Video Visit from 05/20/2021 in [facility] Office Visit from 04/26/2021 in [facility] Video Visit from 02/17/2021 in [facility] Office Visit from 01/21/2021 in [facility] Video Visit from 11/23/2020 in [facility]  Total GAD-7 Score 7 0 8 2 0      PHQ2-9    Flowsheet Row Video Visit from 05/20/2021 in [facility] Office Visit from 04/26/2021 in [facility] Video Visit from 02/17/2021 in [facility] Office Visit from 01/21/2021 in [facility] Video Visit from 11/23/2020 in [facility]  PHQ-2 Total Score 0 0 2 1 0  PHQ-9 Total Score 5 -- 13 5 2       Flowsheet Row ED from 03/08/2021 in [facility] from 12/04/2020 in [facility] Video Visit from 11/23/2020 in [facility]  C-SSRS RISK CATEGORY No Risk Error: Question 6 not populated No Risk        Assessment and Plan: Patient notes that she was unable to pick up her Risperdal prescription.  She notes that she has only been taking an old prescription of Abilify and her hydroxyzine.  She notes that her mood is stable but informed writer that since being off the trazodone she has had insomnia.  At this time Risperdal not restarted.  Patient will continue Abilify 5 mg and restart trazodone 100 mg nightly as needed.  We will continue all other medications as prescribed.   1. Brief psychotic disorder (HCC)  Continue- hydrOXYzine (ATARAX/VISTARIL) 25 MG tablet; TAKE 1 TABLET BY MOUTH EVERY 4 HOURS AS NEEDED FOR ANXIETY  Dispense: 90 tablet; Refill: 3 Continue- ARIPiprazole (ABILIFY) 5 MG tablet; Take 1 tablet (5 mg total) by mouth daily.  Dispense: 30 tablet; Refill: 3 Restart- traZODone (DESYREL) 100 MG tablet; Take 1 tablet (100 mg total) by mouth at bedtime as needed. for sleep  Dispense: 30 tablet; Refill: 3  Follow-up in 3 months Follow-up with therapy    [facility], NP 05/20/2021, 1:14 PM

## 2021-05-25 ENCOUNTER — Ambulatory Visit (INDEPENDENT_AMBULATORY_CARE_PROVIDER_SITE_OTHER): Payer: Self-pay

## 2021-05-25 NOTE — Telephone Encounter (Signed)
Pt. Reports she feels like she is not "doing good with the Depo shots. I'm having long periods and a of weakness, headaches." States "I've never done good on it." Wants to know what PCP thinks. Please advise.    Answer Assessment - Initial Assessment Questions 1. NAME of MEDICATION: "What medicine are you calling about?"     Depo 2. QUESTION: "What is your question?" (e.g., double dose of medicine, side effect)     Side effects 3. PRESCRIBING HCP: "Who prescribed it?" Reason: if prescribed by specialist, call should be referred to that group.     Ms. Randa Evens 4. SYMPTOMS: "Do you have any symptoms?"     Still having long periods 5. SEVERITY: If symptoms are present, ask "Are they mild, moderate or severe?"     Moderate 6. PREGNANCY:  "Is there any chance that you are pregnant?" "When was your last menstrual period?"     No  Protocols used: Medication Question Call-A-AH

## 2021-05-25 NOTE — Telephone Encounter (Signed)
Sent to PCP to advise patient.  

## 2021-05-27 ENCOUNTER — Other Ambulatory Visit: Payer: Self-pay

## 2021-05-27 NOTE — Telephone Encounter (Signed)
Call patient VM not set up. We can d/c depot and look at other birthcontrol options.  Reviewed all forms of birth control options available including abstinence; over the counter/barrier methods; hormonal contraceptive medication including pill, patch, ring, injection,contraceptive implant; hormonal and nonhormonal IUDs; permanent sterilization options including vasectomy and the various tubal sterilization modalities. Risks and benefits reviewed. Questions will be answer at appt

## 2021-05-27 NOTE — Telephone Encounter (Signed)
[name] aware 

## 2021-05-28 ENCOUNTER — Other Ambulatory Visit: Payer: Self-pay

## 2021-05-31 ENCOUNTER — Ambulatory Visit (INDEPENDENT_AMBULATORY_CARE_PROVIDER_SITE_OTHER): Payer: Self-pay | Admitting: *Deleted

## 2021-05-31 ENCOUNTER — Other Ambulatory Visit: Payer: Self-pay

## 2021-05-31 NOTE — Telephone Encounter (Signed)
Pt called in c/o having vaginal spotting with some bleeding for 2 weeks now.   She had a Depo Provera shot on Aug. 22, 2022.   She is having some abd cramping early in the morning and late in the evenings but not during the day.   She has less energy than normal but denies being anemic or dizzy.    She was anemic during her pregnancy during 2008 but not since then as far as she knows.  See triage notes      She was on her break and had to go before I got her an appt made.   I let her know when she calls back to let the agent know she had spoken with a nurse and that she needed to have an appt within 3 days.   She was agreeable to this and thanked me.  She called in for [facility].

## 2021-05-31 NOTE — Telephone Encounter (Signed)
Reason for Disposition  [1] Bleeding or spotting after procedure (e.g., biopsy) or pelvic examination (e.g., pap smear) AND [2] lasts > 7 days  Answer Assessment - Initial Assessment Questions 1. AMOUNT: "Describe the bleeding that you are having."    - SPOTTING: spotting, or pinkish / brownish mucous discharge; does not fill panty liner or pad    - MILD:  less than 1 pad / hour; less than patient's usual menstrual bleeding   - MODERATE: 1-2 pads / hour; 1 menstrual cup every 6 hours; small-medium blood clots (e.g., pea, grape, small coin)   - SEVERE: soaking 2 or more pads/hour for 2 or more hours; 1 menstrual cup every 2 hours; bleeding not contained by pads or continuous red blood from vagina; large blood clots (e.g., golf ball, large coin)      She having her period for 2 wks.   I've had this problem before.   I was on a shot for birth control.   I'm not sure why my periods are so long. Deprovera shot on Aug. 22, 2022.   I had it 3 yrs ago I don't remember having long periods back then.    2. ONSET: "When did the bleeding begin?" "Is it continuing now?"     Started Oct 13.   It's bleeding spotting.   It's pink and sometimes it's brown.   I'm wearing tampons.   Using 5-6 tampons a day but I'm changing them every time I go to the bathroom they are a little stained.    3. MENSTRUAL PERIOD: "When was the last normal menstrual period?" "How is this different than your period?"     My last normal one was in Aug and lasted 6 days which is my normal. 4. REGULARITY: "How regular are your periods?"     Very regular. 5. ABDOMINAL PAIN: "Do you have any pain?" "How bad is the pain?"  (e.g., Scale 1-10; mild, moderate, or severe)   - MILD (1-3): doesn't interfere with normal activities, abdomen soft and not tender to touch    - MODERATE (4-7): interferes with normal activities or awakens from sleep, abdomen tender to touch    - SEVERE (8-10): excruciating pain, doubled over, unable to do any normal  activities      Sometimes I have abd pain early mornings and late a night. 6. PREGNANCY: "Could you be pregnant?" "Are you sexually active?" "Did you recently give birth?"     No.  Not sexually active.   Not had a baby recently 7. BREASTFEEDING: "Are you breastfeeding?"     No 8. HORMONES: "Are you taking any hormone medications, prescription or OTC?" (e.g., birth control pills, estrogen)     Depoprovera and Hydrozine and abilify trazodone. 9. BLOOD THINNERS: "Do you take any blood thinners?" (e.g., Coumadin/warfarin, Pradaxa/dabigatran, aspirin)     No 10. CAUSE: "What do you think is causing the bleeding?" (e.g., recent gyn surgery, recent gyn procedure; known bleeding disorder, cervical cancer, polycystic ovarian disease, fibroids)         I'm not sure. 11. HEMODYNAMIC STATUS: "Are you weak or feeling lightheaded?" If Yes, ask: "Can you stand and walk normally?"        I have less energy than normal.    During 2008 I was anemic with my pregnancy. 12. OTHER SYMPTOMS: "What other symptoms are you having with the bleeding?" (e.g., passed tissue, vaginal discharge, fever, menstrual-type cramps)       Last week I passed some tissue that  was red.  Protocols used: Vaginal Bleeding - Abnormal-A-AH

## 2021-06-08 ENCOUNTER — Encounter (HOSPITAL_COMMUNITY): Payer: Self-pay

## 2021-06-08 ENCOUNTER — Other Ambulatory Visit: Payer: Self-pay

## 2021-06-08 ENCOUNTER — Ambulatory Visit (HOSPITAL_COMMUNITY)
Admission: RE | Admit: 2021-06-08 | Discharge: 2021-06-08 | Disposition: A | Discharge status: Home / Self Care | Payer: Medicaid Other | Source: Ambulatory Visit | Attending: Family Medicine | Admitting: Family Medicine

## 2021-06-08 VITALS — BP 113/60 | HR 104 | Temp 98.7°F | Resp 19

## 2021-06-08 DIAGNOSIS — N939 Abnormal uterine and vaginal bleeding, unspecified: Secondary | ICD-10-CM | POA: Diagnosis not present

## 2021-06-08 DIAGNOSIS — N760 Acute vaginitis: Secondary | ICD-10-CM | POA: Diagnosis present

## 2021-06-08 LAB — POCT URINALYSIS DIPSTICK, ED / UC
Bilirubin Urine: NEGATIVE
Glucose, UA: NEGATIVE mg/dL
Ketones, ur: NEGATIVE mg/dL
Leukocytes,Ua: NEGATIVE
Nitrite: NEGATIVE
Protein, ur: NEGATIVE mg/dL
Specific Gravity, Urine: 1.025 (ref 1.005–1.030)
Urobilinogen, UA: 0.2 mg/dL (ref 0.0–1.0)
pH: 7 (ref 5.0–8.0)

## 2021-06-08 LAB — POC URINE PREG, ED: Preg Test, Ur: NEGATIVE

## 2021-06-08 MED ORDER — MEGESTROL ACETATE 40 MG PO TABS
40.0000 mg | ORAL_TABLET | Freq: Two times a day (BID) | ORAL | 0 refills | Status: DC
  Filled 2021-06-08: qty 28, 14d supply, fill #0

## 2021-06-08 NOTE — ED Triage Notes (Signed)
Pt presents with c/o vaginal bleeding since 9/13. States she has not stopped bleeding and states she has not had sexual intercourse nor painful urination.

## 2021-06-08 NOTE — ED Provider Notes (Signed)
[facility]    CSN: [id number] Arrival date & time: 06/08/21  1237      History   Chief Complaint Chief Complaint  Patient presents with   Appointment    1300    HPI Erin Ponce is a 25 y.o. female.   Patient presenting today for evaluation of almost 1 month of daily vaginal bleeding, menstrual cramps and now the past 2 weeks having vaginal discharge and developing an odor.  Denies abdominal pain, nausea vomiting or diarrhea, fever, chills, bowel changes.  States she started on Depo injections about 2 months ago and is wondering if this has something to do with it as she has had abnormal bleeding with Depo in the past.  No medication changes, routine changes, new sexual partners recently.  Has not tried anything over-the-counter for symptoms thus far.   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 Authorizing Provider  megestrol (MEGACE) 40 MG tablet Take 1 tablet (40 mg total) by mouth 2 (two) times daily. 06/08/21  Yes Particia Nearing, PA-C  ARIPiprazole (ABILIFY) 5 MG tablet Take 1 tablet (5 mg total) by mouth daily. 05/20/21   Shanna Cisco, NP  benztropine (COGENTIN) 0.5 MG  tablet Take 1 tablet (0.5 mg total) by mouth 2 (two) times daily. Patient not taking: Reported on 04/26/2021 02/17/21   Toy Cookey E, NP  hydrOXYzine (ATARAX/VISTARIL) 25 MG tablet TAKE 1 TABLET BY MOUTH EVERY 4 HOURS AS NEEDED FOR ANXIETY 05/20/21   Toy Cookey E, NP  traZODone (DESYREL) 100 MG tablet Take 1 tablet (100 mg total) by mouth at bedtime as needed. for sleep 05/20/21   Toy Cookey E, NP  Cetirizine HCl 10 MG CAPS Take 1 capsule (10 mg total) by mouth daily. 07/07/19 04/08/20  Wieters, Junius Creamer, PA-C    Family History Family History  Problem Relation Age of Onset   Diabetes Mother    Thyroid disease Mother     Social History Social History   Tobacco Use   Smoking status: Never   Smokeless tobacco: Never  Vaping Use   Vaping Use: Never used  Substance Use Topics   Alcohol use: No   Drug use: Yes    Types: Marijuana    Comment: recently stopped     Allergies   Latex   Review of Systems Review of Systems  Per HPI  Physical Exam Triage Vital Signs ED Triage Vitals  Enc Vitals Group     BP 06/08/21 1322 113/60     Pulse Rate 06/08/21 1321 (!) 104     Resp 06/08/21 1321 19     Temp 06/08/21 1321 98.7 F (37.1 C)     Temp Source 06/08/21 1321 Oral     SpO2 06/08/21 1321 100 %     Weight --      Height --      Head Circumference --      Peak Flow --      Pain Score 06/08/21 1320 5     Pain Loc --      Pain Edu? --      Excl. in GC? --    No data found.  Updated Vital Signs BP 113/60   Pulse (!) 104   Temp 98.7 F (37.1 C) (Oral)   Resp 19   LMP 05/18/2021 (Exact Date)   SpO2 100%   Visual Acuity Right Eye Distance:   Left Eye Distance:   Bilateral Distance:    Right Eye Near:   Left Eye Near:    Bilateral Near:     Physical Exam Vitals and nursing note reviewed.  Constitutional:      Appearance: Normal appearance. She is not ill-appearing.  HENT:     Head: Atraumatic.  Eyes:     Extraocular Movements: Extraocular  movements intact.     Conjunctiva/sclera: Conjunctivae normal.  Cardiovascular:     Rate and Rhythm: Normal rate and regular rhythm.     Heart sounds: Normal heart sounds.  Pulmonary:     Effort: Pulmonary effort is normal.     Breath sounds: Normal breath sounds.  Abdominal:     General: Bowel sounds are normal. There is no distension.     Palpations: Abdomen is soft.     Tenderness: There is no abdominal tenderness. There is no guarding.  Genitourinary:    Comments: GU exam deferred, self swab performed Musculoskeletal:        General: Normal range of motion.     Cervical back: Normal range of motion and neck supple.  Skin:    General: Skin is warm and dry.  Neurological:     Mental Status: She is alert and oriented to person, place, and time.  Psychiatric:        Mood and Affect: Mood normal.        Thought Content: Thought content normal.        Judgment: Judgment normal.     UC Treatments / Results  Labs (all labs ordered are listed, but only abnormal results are displayed) Labs Reviewed  POCT URINALYSIS DIPSTICK, ED / UC - Abnormal; Notable for the following components:      Result Value   Hgb urine dipstick MODERATE (*)    All other components within normal limits  POC URINE PREG, ED  CERVICOVAGINAL ANCILLARY ONLY    EKG   Radiology No results found.  Procedures Procedures (including critical care time)  Medications Ordered in UC Medications - No data to display  Initial Impression / Assessment and Plan / UC Course  I have reviewed the triage vital signs and the nursing notes.  Pertinent labs & imaging results that were available during my care of the patient were reviewed by me and considered in my medical decision making (see chart for details).     Overall vitals and exam reassuring, mildly tachycardic in  triage but states she is anxious to be here so suspect this is secondary to this.  She does state that the bleeding has slowed down significantly  in the past week or so and now the discharge is more predominant than the bleeding, low suspicion for significant anemia related to the amount of bleeding that she is having.  We will treat with Megace for 2 weeks while we wait follow-up with OB/GYN and vaginal swab pending to rule out vaginal infections as a possible cause of the prolonged bleeding.  Do suspect that the bleeding is related to her body adjusting to Depo injections.  Urine pregnancy negative, urinalysis negative for urinary tract infection.  Return for acutely worsening symptoms.  Final Clinical Impressions(s) / UC Diagnoses   Final diagnoses:  Vaginal bleeding  Vaginitis and vulvovaginitis   Discharge Instructions   None    ED Prescriptions     Medication Sig Dispense Auth. Provider   megestrol (MEGACE) 40 MG tablet Take 1 tablet (40 mg total) by mouth 2 (two) times daily. 28 tablet Particia Nearing, [place]      PDMP not reviewed this encounter.   Particia Nearing, [place] 06/08/21 1406

## 2021-06-09 LAB — CERVICOVAGINAL ANCILLARY ONLY
Bacterial Vaginitis (gardnerella): POSITIVE — AB
Candida Glabrata: NEGATIVE
Candida Vaginitis: NEGATIVE
Chlamydia: NEGATIVE
Comment: NEGATIVE
Comment: NEGATIVE
Comment: NEGATIVE
Comment: NEGATIVE
Comment: NEGATIVE
Comment: NORMAL
Neisseria Gonorrhea: NEGATIVE
Trichomonas: NEGATIVE

## 2021-06-10 ENCOUNTER — Telehealth (HOSPITAL_COMMUNITY): Payer: Self-pay | Admitting: Emergency Medicine

## 2021-06-10 MED ORDER — METRONIDAZOLE 500 MG PO TABS: 500.0000 mg | ORAL_TABLET | Freq: Two times a day (BID) | ORAL | 0 refills | Status: DC

## 2021-06-21 ENCOUNTER — Ambulatory Visit (INDEPENDENT_AMBULATORY_CARE_PROVIDER_SITE_OTHER): Payer: Medicaid Other | Admitting: Primary Care

## 2021-06-28 ENCOUNTER — Encounter (INDEPENDENT_AMBULATORY_CARE_PROVIDER_SITE_OTHER): Payer: Self-pay | Admitting: Primary Care

## 2021-06-28 ENCOUNTER — Ambulatory Visit (INDEPENDENT_AMBULATORY_CARE_PROVIDER_SITE_OTHER): Payer: Medicaid Other | Admitting: Primary Care

## 2021-06-28 ENCOUNTER — Other Ambulatory Visit: Payer: Self-pay

## 2021-06-28 VITALS — BP 118/85 | HR 96 | Temp 97.3°F | Ht 62.0 in | Wt 147.8 lb

## 2021-06-28 DIAGNOSIS — Z3009 Encounter for other general counseling and advice on contraception: Secondary | ICD-10-CM

## 2021-06-28 DIAGNOSIS — Z23 Encounter for immunization: Secondary | ICD-10-CM | POA: Diagnosis not present

## 2021-06-28 NOTE — Progress Notes (Signed)
  [facility]      HPI [name] is a 25 y.o.female presents for menorrhalgia.  She has had an extended painful menstrual cycle that lasted almost approximately 6 weeks she is currently on Depo injections.  After this experience she has decided not to continue with Depo injections.  We reviewed different types of birth control methods we will also provide resources on AVS.  She will return when she decides what works best for her and her body.  Past Medical History:  Diagnosis Date   Anxiety    Dislocated knee 2008   R knee   Miscarriage      Allergies  Allergen Reactions   Latex Itching      Current Outpatient Medications on File Prior to Visit  Medication Sig Dispense Refill   ARIPiprazole (ABILIFY) 5 MG tablet Take 1 tablet (5 mg total) by mouth daily. 30 tablet 3   benztropine (COGENTIN) 0.5 MG tablet Take 1 tablet (0.5 mg total) by mouth 2 (two) times daily. 60 tablet 2   hydrOXYzine (ATARAX/VISTARIL) 25 MG tablet TAKE 1 TABLET BY MOUTH EVERY 4 HOURS AS NEEDED FOR ANXIETY 90 tablet 3   traZODone (DESYREL) 100 MG tablet Take 1 tablet (100 mg total) by mouth at bedtime as needed. for sleep 30 tablet 3   [DISCONTINUED] Cetirizine HCl 10 MG CAPS Take 1 capsule (10 mg total) by mouth daily. 20 capsule 0   No current facility-administered medications on file prior to visit.    ROS: all negative except above.   Physical Exam: Filed Weights   06/28/21 1412  Weight: 147 lb 12.8 oz (67 kg)   BP 118/85 (BP Location: Right Arm, Patient Position: Sitting, Cuff Size: Normal)   Pulse 96   Temp (!) 97.3 F (36.3 C) (Temporal)   Ht 5\' 2"  (1.575 m)   Wt 147 lb 12.8 oz (67 kg)   LMP 05/18/2021 (Exact Date)   SpO2 99%   BMI 27.03 kg/m  General Appearance: Well nourished, in no apparent distress. Eyes: PERRLA, EOMs, conjunctiva no swelling or erythema Sinuses: No Frontal/maxillary tenderness ENT/Mouth: Ext aud canals clear, TMs without erythema right  ,left ear cerumen impaction. No erythema, swelling, or exudate on post pharynx.  Tonsils not swollen or erythematous. Hearing normal.  Neck: Supple, thyroid normal.  Respiratory: Respiratory effort normal, BS equal bilaterally without rales, rhonchi, wheezing or stridor.  Cardio: RRR with no MRGs. Brisk peripheral pulses without edema.  Abdomen: Soft, + BS.  Non tender, no guarding, rebound, hernias, masses. Lymphatics: Non tender without lymphadenopathy.  Musculoskeletal: Full ROM, 5/5 strength, normal gait.  Skin: Warm, dry without rashes, lesions, ecchymosis.  Neuro: Cranial nerves intact. Normal muscle tone, no cerebellar symptoms. Sensation intact.  Psych: Awake and oriented X 3, normal affect, Insight and Judgment appropriate.   Erin Ponce was seen today for menorrhagia.  Diagnoses and all orders for this visit:  Birth control counseling Reviewed all forms of birth control options available including abstinence; over the counter/barrier methods; hormonal contraceptive medication including pill, patch, ring, injection,contraceptive implant; hormonal and nonhormonal IUDs; permanent sterilization options including vasectomy and the various tubal sterilization modalities. Risks and benefits reviewed. Questions were answered.  Information was given to patient to review.      05/20/2021, NP 2:22 PM

## 2021-06-28 NOTE — Patient Instructions (Addendum)
Influenza, Adult Influenza is also called "the flu." It is an infection in the lungs, nose, and throat (respiratory tract). It spreads easily from person to person (is contagious). The flu causes symptoms that are like a cold, along with high fever and body aches. What are the causes? This condition is caused by the influenza virus. You can get the virus by: Breathing in droplets that are in the air after a person infected with the flu coughed or sneezed. Touching something that has the virus on it and then touching your mouth, nose, or eyes. What increases the risk? Certain things may make you more likely to get the flu. These include: Not washing your hands often. Having close contact with many people during cold and flu season. Touching your mouth, eyes, or nose without first washing your hands. Not getting a flu shot every year. You may have a higher risk for the flu, and serious problems, such as a lung infection (pneumonia), if you: Are older than 65. Are pregnant. Have a weakened disease-fighting system (immune system) because of a disease or because you are taking certain medicines. Have a long-term (chronic) condition, such as: Heart, kidney, or lung disease. Diabetes. Asthma. Have a liver disorder. Are very overweight (morbidly obese). Have anemia. What are the signs or symptoms? Symptoms usually begin suddenly and last 4-14 days. They may include: Fever and chills. Headaches, body aches, or muscle aches. Sore throat. Cough. Runny or stuffy (congested) nose. Feeling discomfort in your chest. Not wanting to eat as much as normal. Feeling weak or tired. Feeling dizzy. Feeling sick to your stomach or throwing up. How is this treated? If the flu is found early, you can be treated with antiviral medicine. This can help to reduce how bad the illness is and how long it lasts. This may be given by mouth or through an IV tube. Taking care of yourself at home can help your  symptoms get better. Your doctor may want you to: Take over-the-counter medicines. Drink plenty of fluids. The flu often goes away on its own. If you have very bad symptoms or other problems, you may be treated in a hospital. Follow these instructions at home:   Activity Rest as needed. Get plenty of sleep. Stay home from work or school as told by your doctor. Do not leave home until you do not have a fever for 24 hours without taking medicine. Leave home only to go to your doctor. Eating and drinking Take an ORS (oral rehydration solution). This is a drink that is sold at pharmacies and stores. Drink enough fluid to keep your pee pale yellow. Drink clear fluids in small amounts as you are able. Clear fluids include: Water. Ice chips. Fruit juice mixed with water. Low-calorie sports drinks. Eat bland foods that are easy to digest. Eat small amounts as you are able. These foods include: Bananas. Applesauce. Rice. Lean meats. Toast. Crackers. Do not eat or drink: Fluids that have a lot of sugar or caffeine. Alcohol. Spicy or fatty foods. General instructions Take over-the-counter and prescription medicines only as told by your doctor. Use a cool mist humidifier to add moisture to the air in your home. This can make it easier for you to breathe. When using a cool mist humidifier, clean it daily. Empty water and replace with clean water. Cover your mouth and nose when you cough or sneeze. Wash your hands with soap and water often and for at least 20 seconds. This is also important after  you cough or sneeze. If you cannot use soap and water, use alcohol-based hand sanitizer. Keep all follow-up visits. How is this prevented?  Get a flu shot every year. You may get the flu shot in late summer, fall, or winter. Ask your doctor when you should get your flu shot. Avoid contact with people who are sick during fall and winter. This is cold and flu season. Contact a doctor if: You get  new symptoms. You have: Chest pain. Watery poop (diarrhea). A fever. Your cough gets worse. You start to have more mucus. You feel sick to your stomach. You throw up. Get help right away if you: Have shortness of breath. Have trouble breathing. Have skin or nails that turn a bluish color. Have very bad pain or stiffness in your neck. Get a sudden headache. Get sudden pain in your face or ear. Cannot eat or drink without throwing up. These symptoms may represent a serious problem that is an emergency. Get medical help right away. Call your local emergency services (911 in the [place] Do not wait to see if the symptoms will go away. Do not drive yourself to the hospital. Summary Influenza is also called "the flu." It is an infection in the lungs, nose, and throat. It spreads easily from person to person. Take over-the-counter and prescription medicines only as told by your doctor. Getting a flu shot every year is the best way to not get the flu. This information is not intended to replace advice given to you by your health care provider. Make sure you discuss any questions you have with your health care provider. Document Revised: 04/10/2020 Document Reviewed: 04/10/2020 Elsevier Patient Education  2022 [employer]. Contraception Choices Contraception refers to things you do or use to prevent pregnancy. It is also called birth control. There are several methods of birth control. Talk to your doctor about the best method for you. Hormonal birth control This kind of birth control uses hormones. Here are some types of hormonal birth control: A tube that is put under the skin of your arm (implant). The tube can stay in for up to 3 years. Shots you get every 3 months. Pills you take every day. A patch you change 1 time each week for 3 weeks. After that, the patch is taken off for 1 week. A ring you put in the vagina. The ring is left in for 3 weeks. Then it is taken out of the vagina for  1 week. Then a new ring is put in. Pills you take after unprotected sex. These are called emergency birth control pills. Barrier birth control Here are some types of barrier birth control: A thin covering that is put on the penis before sex (female condom). The covering is thrown away after sex. A soft, loose covering that is put in the vagina before sex (female condom). The covering is thrown away after sex. A rubber bowl that sits over the cervix (diaphragm). The bowl must be made for you. The bowl is put into the vagina before sex. The bowl is left in for 6-8 hours after sex. It is taken out within 24 hours. A small, soft cup that fits over the cervix (cervical cap). The cup must be made for you. The cup should be left in for 6-8 hours after sex. It is taken out within 48 hours. A sponge that is put into the vagina before sex. It must be left in for at least 6 hours after sex. It must  be taken out within 30 hours and thrown away. A chemical that kills or stops sperm from getting into the womb (uterus). This chemical is called a spermicide. It may be a pill, cream, jelly, or foam to put in the vagina. The chemical should be used at least 10-15 minutes before sex. IUD birth control IUD means "intrauterine device." It is put inside the womb. There are two kinds: Hormone IUD. This kind can stay in the womb for 3-5 years. Copper IUD. This kind can stay in the womb for 10 years. Permanent birth control Here are some types of permanent birth control: Surgery to block the fallopian tubes. Having an insert put into each fallopian tube. This method takes 3 months to work. Other forms of birth control must be used for 3 months. Surgery to tie off the tubes that carry sperm in men (vasectomy). This method takes 3 months to work. Other forms of birth control must be used for 3 months. Natural planning birth control Here are some types of natural planning birth control: Not having sex on the days the woman  could get pregnant. Using a calendar: To keep track of the length of each menstrual cycle. To find out what days pregnancy can happen. To plan to not have sex on days when pregnancy can happen. Watching for signs of ovulation and not having sex during this time. One way the woman can check for ovulation is to check her temperature. Waiting to have sex until after ovulation. Where to find more information Centers for Disease Control and Prevention: [employer] Summary Contraception, also called birth control, refers to things you do or use to prevent pregnancy. Hormonal methods of birth control include implants, injections, pills, patches, vaginal rings, and emergency birth control pills. Barrier methods of birth control can include female condoms, female condoms, diaphragms, cervical caps, sponges, and spermicides. There are two types of IUD (intrauterine device) birth control. An IUD can be put in a woman's womb to prevent pregnancy for several years. Permanent birth control can be done through a procedure for males, females, or both. Natural planning means not having sex when the woman could get pregnant. This information is not intended to replace advice given to you by your health care provider. Make sure you discuss any questions you have with your health care provider. Document Revised: 01/27/2020 Document Reviewed: 01/27/2020 Elsevier Patient Education  2022 [employer].

## 2021-07-12 ENCOUNTER — Encounter (INDEPENDENT_AMBULATORY_CARE_PROVIDER_SITE_OTHER): Payer: Self-pay | Admitting: Primary Care

## 2021-07-12 ENCOUNTER — Other Ambulatory Visit: Payer: Self-pay

## 2021-07-12 ENCOUNTER — Ambulatory Visit (INDEPENDENT_AMBULATORY_CARE_PROVIDER_SITE_OTHER): Payer: Medicaid Other | Admitting: Primary Care

## 2021-07-12 VITALS — BP 122/81 | HR 94 | Resp 16

## 2021-07-12 DIAGNOSIS — H6122 Impacted cerumen, left ear: Secondary | ICD-10-CM | POA: Diagnosis not present

## 2021-07-12 DIAGNOSIS — Z3042 Encounter for surveillance of injectable contraceptive: Secondary | ICD-10-CM

## 2021-07-12 MED ORDER — MEDROXYPROGESTERONE ACETATE 150 MG/ML IM SUSP
150.0000 mg | Freq: Once | INTRAMUSCULAR | Status: AC
  Administered 2021-07-12: 150 mg via INTRAMUSCULAR

## 2021-07-12 NOTE — Progress Notes (Signed)
Depo provera Ear lavage

## 2021-07-12 NOTE — Progress Notes (Signed)
  [facility]  Birthcontrol  Patient name: Erin Ponce MRN [id number]  Date of birth: [birth year] Chief Complaint:   Cerumen Impaction  History of Present Illness:   Erin Ponce is a 25 y.o. G1P0 African American female being seen today for depo.     Patient's last menstrual period was 06/30/2021. The current method of family planning is Depo-Provera injections.   Review of Systems:   Pertinent items are noted in HPI Denies fever/chills, dizziness, headaches, visual disturbances, fatigue, shortness of breath, chest pain, abdominal pain, vomiting, abnormal vaginal discharge/itching/odor/irritation, problems with periods, bowel movements, urination, or intercourse unless otherwise stated above.  Pertinent History Reviewed:  Reviewed past medical,surgical, social, obstetrical and family history.  Reviewed problem list, medications and allergies. Physical Assessment:   Vitals:   07/12/21 1458  BP: 122/81  Pulse: 94  Resp: 16  SpO2: 98%  There is no height or weight on file to calculate BMI.       Physical Examination:   General appearance: alert, well appearing, and in no distress  Mental status: alert, oriented to person, place, and time             ENT: Left ear cerumen impaction   Skin: warm & dry   Cardiovascular: normal heart rate noted  Respiratory: normal respiratory effort, no distress  Abdomen: soft, non-tender   Extremities: no edema   No results found for this or any previous visit (from the past 24 hour(s)).  Assessment & Plan:  Diagnoses and all orders for this visit:  Encounter for Depo-Provera contraception Long discussion of DepoProvera including possible delay in return to fertility for up to a year after cessation, as well as irregular bleeding first 3-6 months and then amenorrhea after injections  Also discussed black box warining about osteoporosis after 2 years of use -     medroxyPROGESTERone (DEPO-PROVERA) injection 150  mg  Impacted cerumen of left ear -     Ear Lavage   PROCEDURE: CERUMEN DISIMPACTION   The patient had a large amount of cerumen in the external auditory canal(s): left Ear wax softener was used prior to the lavage. Curettage by provider was not performed in addition.  There were no complications and following the disimpaction the tympanic membrane was visible. Charges to be entered in Charge Capture section.   Meds:  Meds ordered this encounter  Medications   medroxyPROGESTERone (DEPO-PROVERA) injection 150 mg    Orders Placed This Encounter  Procedures   Ear Lavage    3 months depo  Grayce Sessions  07/18/2021 6:10 PM

## 2021-07-12 NOTE — Patient Instructions (Signed)
Contraceptive Injection A contraceptive injection is a shot that prevents pregnancy. It is also called a birth control shot. The shot contains the hormone progestin, which prevents pregnancy by: Stopping the ovaries from releasing eggs. Thickening cervical mucus to prevent sperm from entering the cervix. Thinning the lining of the uterus to prevent a fertilized egg from attaching to the uterus. Contraceptive injections are given under the skin (subcutaneous) or into a muscle (intramuscular). For these shots to work, you must get one of them every 3 months (12-13 weeks) from a health care provider. Tell a health care provider about: Any allergies you have. All medicines you are taking, including vitamins, herbs, eye drops, creams, and over-the-counter medicines. Any blood disorders you have. Any medical conditions you have. Whether you are pregnant or may be pregnant. What are the risks? Generally, this is a safe procedure. However, problems may occur, including: Mood changes or depression. Loss of bone density (osteoporosis) after long-term use. Blood clots. This is rare. Higher risk of an egg being fertilized outside your uterus (ectopic pregnancy).This is rare. What happens before the procedure? Your health care provider may do a routine physical exam. You may have a test to make sure you are not pregnant. What happens during the procedure?  The area where the shot will be given will be cleaned and sanitized with alcohol. A needle will be inserted into a muscle in your upper arm or buttock, or into the skin of your thigh or abdomen. The needle will be attached to a syringe with the medicine inside of it. The medicine will be pushed through the syringe and injected into your body. A small bandage (dressing) may be placed over the injection site. What can I expect after the procedure? After the procedure, it is common to have: Soreness around the injection site for a couple of  days. Irregular menstrual bleeding. Weight gain. Breast tenderness. Headaches. Discomfort in your abdomen. Ask your health care provider whether you need to use an added method of birth control (backup contraception), such as a condom, sponge, or spermicide. If the first shot is given 1-7 days after the start of your last menstrual period, you will not need backup contraception. If the first shot is given at any other time during your menstrual cycle, you should avoid having sex. If you do have sex, you will need to use backup contraception for 7 days after you receive the shot. Follow these instructions at home: General instructions Take over-the-counter and prescription medicines only as told by your health care provider. Do not rub or massage the injection site. Track your menstrual periods so you will know if they become irregular. Always use a condom to protect against sexually transmitted infections (STIs). Make sure you schedule an appointment in time for your next shot and mark it on your calendar. You must get an injection every 3 months (12-13 weeks) to prevent pregnancy. Lifestyle Do not use any products that contain nicotine or tobacco. These products include cigarettes, chewing tobacco, and vaping devices, such as e-cigarettes. If you need help quitting, ask your health care provider. Eat foods that are high in calcium and vitamin D, such as milk, cheese, and salmon. Doing this may help with any loss in bone density caused by the contraceptive injection. Ask your health care provider for dietary recommendations. Contact a health care provider if you: Have nausea or vomiting. Have abnormal vaginal discharge or bleeding. Miss a menstrual period or think you might be pregnant. Experience mood changes  or depression. Feel dizzy or light-headed. Have leg pain. Get help right away if you: Have chest pain or cough up blood. Have shortness of breath. Have a severe headache that does  not go away. Have numbness in any part of your body. Have slurred speech or vision problems. Have vaginal bleeding that is abnormally heavy or does not stop, or you have severe pain in your abdomen. Have depression that does not get better with treatment. If you ever feel like you may hurt yourself or others, or have thoughts about taking your own life, get help right away. Go to your nearest emergency department or: Call your local emergency services (911 in the [place] Call a suicide crisis helpline, such as the National Suicide Prevention Lifeline at (954)101-9700 or 988 in the [place] This is open 24 hours a day in the [place] Text the Crisis Text Line at (434) 001-2068 (in the [place] Summary A contraceptive injection is a shot that prevents pregnancy. It is also called the birth control shot. The shot is given under the skin (subcutaneous) or into a muscle (intramuscular). After this procedure, it is common to have soreness around the injection site for a couple of days. To prevent pregnancy, the shot must be given by a health care provider every 3 months (12-13 weeks). After you have the shot, ask your health care provider whether you need to use an added method of birth control (backup contraception), such as a condom, sponge, or spermicide. This information is not intended to replace advice given to you by your health care provider. Make sure you discuss any questions you have with your health care provider. Document Revised: 03/17/2021 Document Reviewed: 03/02/2020 Elsevier Patient Education  2022 [employer].

## 2021-08-20 ENCOUNTER — Telehealth (HOSPITAL_COMMUNITY): Payer: Medicaid Other | Admitting: Psychiatry

## 2021-09-01 ENCOUNTER — Ambulatory Visit (HOSPITAL_COMMUNITY)
Admission: EM | Admit: 2021-09-01 | Discharge: 2021-09-01 | Disposition: A | Discharge status: Home / Self Care | Payer: Medicaid Other | Attending: Urgent Care | Admitting: Urgent Care

## 2021-09-01 ENCOUNTER — Other Ambulatory Visit: Payer: Self-pay

## 2021-09-01 ENCOUNTER — Encounter (HOSPITAL_COMMUNITY): Payer: Self-pay

## 2021-09-01 DIAGNOSIS — N760 Acute vaginitis: Secondary | ICD-10-CM | POA: Diagnosis present

## 2021-09-01 DIAGNOSIS — N938 Other specified abnormal uterine and vaginal bleeding: Secondary | ICD-10-CM | POA: Diagnosis present

## 2021-09-01 NOTE — Discharge Instructions (Addendum)
Your abnormal bleeding is likely secondary to your depo-provera injection. We will refrain from Megace at this time as we use this primarily for heavy menstrual bleeding. Please monitor your spotting and speak with your PCP on Jan 11 at your follow up.  It would be recommended to have thyroid and blood cell count labs. We will call with results of your vaginal swab and treat any positive test results.

## 2021-09-01 NOTE — ED Triage Notes (Signed)
Pt presents to the office for prolong  menstrual cycle greater then 28 days. She started depo-provera in August of 2022. Pt is not soaking 1-2 pads per hour. She would like to be check for BV.

## 2021-09-01 NOTE — ED Provider Notes (Signed)
[place]    CSN: [id number] Arrival date & time: 09/01/21  1511      History   Chief Complaint Chief Complaint  Patient presents with   abnormal bleeding     HPI Erin Ponce is a 25 y.o. female.   25 year old female presents today with concerns of abnormal bleeding.  She states she had her first Depo-Provera shot back in August, and started having abnormal bleeding in September and beginning of October.  She apparently was seen in our clinic back and was prescribed Megace x14 days.  She states it helped stop the bleeding, but then received her second Depo-Provera injection just a few weeks ago.  She states she has now had intermittent bleeding for the past several weeks again.  She denies large quantities and states it is more like intermittent pink spotting.  She denies any pelvic pain or discharge.  She has a follow-up with her gynecologist in 2 weeks.  She is also concerned as she notes that she feels cold all the time.  Has labs scheduled with her PCP in 2 weeks.  Lastly, patient is requesting STI evaluation due to mild vaginal discharge.  She denies known exposure to STI and denies pelvic pain.    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 Authorizing Provider  ARIPiprazole (ABILIFY) 5 MG tablet Take 1 tablet (5 mg total) by mouth daily. 05/20/21   Salley Slaughter, NP  benztropine (COGENTIN) 0.5 MG tablet Take 1 tablet (0.5 mg total) by mouth 2 (two) times daily. Patient not taking: Reported on 07/12/2021 02/17/21   Eulis Canner E, NP  hydrOXYzine (ATARAX/VISTARIL) 25 MG tablet TAKE 1 TABLET BY MOUTH EVERY 4 HOURS AS NEEDED FOR ANXIETY 05/20/21   Eulis Canner E, NP  traZODone (DESYREL) 100 MG tablet Take 1 tablet (100 mg total) by mouth at bedtime as needed. for sleep 05/20/21   Eulis Canner E, NP  Cetirizine HCl 10 MG CAPS Take 1 capsule (10 mg total) by mouth daily. 07/07/19 04/08/20  Wieters, Elesa Hacker, PA-C    Family History Family History  Problem Relation Age of Onset   Diabetes Mother    Thyroid disease Mother     Social History Social History   Tobacco Use   Smoking status: Never   Smokeless tobacco: Never  Vaping Use  Vaping Use: Never used  Substance Use Topics   Alcohol use: No   Drug use: Yes    Types: Marijuana    Comment: recently stopped     Allergies   Latex   Review of Systems Review of Systems  Genitourinary:  Positive for menstrual problem, vaginal bleeding and vaginal discharge.  All other systems reviewed and are negative.   Physical Exam Triage Vital Signs ED Triage Vitals  Enc Vitals Group     BP 09/01/21 1801 (!) 154/92     Pulse Rate 09/01/21 1801 82     Resp 09/01/21 1801 16     Temp 09/01/21 1801 97.8 F (36.6 C)     Temp Source 09/01/21 1801 Oral     SpO2 09/01/21 1801 100 %     Weight --      Height --      Head Circumference --      Peak Flow --      Pain Score 09/01/21 1802 0     Pain Loc --      Pain Edu? --      Excl. in GC? --    No data found.  Updated Vital Signs BP (!) 154/92 (BP Location: Left Arm)    Pulse 82    Temp 97.8 F (36.6 C) (Oral)    Resp 16    LMP 09/01/2021  (Approximate)    SpO2 100%   Visual Acuity Right Eye Distance:   Left Eye Distance:   Bilateral Distance:    Right Eye Near:   Left Eye Near:    Bilateral Near:     Physical Exam Vitals and nursing note reviewed.  Constitutional:      General: She is not in acute distress.    Appearance: She is well-developed.  HENT:     Head: Normocephalic and atraumatic.  Eyes:     Conjunctiva/sclera: Conjunctivae normal.  Cardiovascular:     Rate and Rhythm: Normal rate and regular rhythm.     Heart sounds: No murmur heard. Pulmonary:     Effort: Pulmonary effort is normal. No respiratory distress.     Breath sounds: Normal breath sounds.  Abdominal:     Palpations: Abdomen is soft.     Tenderness: There is no abdominal tenderness.  Genitourinary:    Comments: Exam deferred Musculoskeletal:        General: No swelling.     Cervical back: Neck supple.  Skin:    General: Skin is warm and dry.     Capillary Refill: Capillary refill takes less than 2 seconds.  Neurological:     Mental Status: She is alert.  Psychiatric:        Mood and Affect: Mood normal.     UC Treatments / Results  Labs (all labs ordered are listed, but only abnormal results are displayed) Labs Reviewed  CERVICOVAGINAL ANCILLARY ONLY    EKG   Radiology No results found.  Procedures Procedures (including critical care time)  Medications Ordered in UC Medications - No data to display  Initial Impression / Assessment and Plan / UC Course  I have reviewed the triage vital signs and the nursing notes.  Pertinent labs & imaging results that were available during my care of the patient were reviewed by me and considered in my medical decision making (see chart for details).  Clinical Course as of 09/01/21 1851  Wed Sep 01, 2021  1846 Recheck BP 143/102 [WC]    Clinical Course User  Index [WC] Jamontae Thwaites L, PA    Uterine bleeding - secondary to depo-provera injection.  This is a known side  effect, must discuss with PCP.  Consider alternative contraceptive if symptoms persist or continue to be of concern.  Spotting appears to light for Megace at this time. Elevated blood pressure -patient denies hypertension.  Feels anxious in doctors offices.  Monitor at home. Vaginitis -self swab performed, patient refused exam in office.  Will treat pending results.  Final Clinical Impressions(s) / UC Diagnoses   Final diagnoses:  Dysfunctional uterine bleeding  Vaginitis and vulvovaginitis     Discharge Instructions      Your abnormal bleeding is likely secondary to your depo-provera injection. We will refrain from Megace at this time as we use this primarily for heavy menstrual bleeding. Please monitor your spotting and speak with your PCP on Jan 11 at your follow up.  It would be recommended to have thyroid and blood cell count labs. We will call with results of your vaginal swab and treat any positive test results.      ED Prescriptions   None    PDMP not reviewed this encounter.   Chaney Malling, [place] 09/01/21 2149

## 2021-09-02 ENCOUNTER — Telehealth (HOSPITAL_COMMUNITY): Payer: Self-pay | Admitting: Emergency Medicine

## 2021-09-02 LAB — CERVICOVAGINAL ANCILLARY ONLY
Bacterial Vaginitis (gardnerella): POSITIVE — AB
Candida Glabrata: NEGATIVE
Candida Vaginitis: NEGATIVE
Chlamydia: NEGATIVE
Comment: NEGATIVE
Comment: NEGATIVE
Comment: NEGATIVE
Comment: NEGATIVE
Comment: NEGATIVE
Comment: NORMAL
Neisseria Gonorrhea: NEGATIVE
Trichomonas: NEGATIVE

## 2021-09-02 MED ORDER — METRONIDAZOLE 500 MG PO TABS: 500.0000 mg | ORAL_TABLET | Freq: Two times a day (BID) | ORAL | 0 refills | Status: DC

## 2021-09-15 ENCOUNTER — Other Ambulatory Visit: Payer: Self-pay

## 2021-09-15 ENCOUNTER — Ambulatory Visit (INDEPENDENT_AMBULATORY_CARE_PROVIDER_SITE_OTHER): Payer: Medicaid Other | Admitting: Primary Care

## 2021-09-15 VITALS — BP 118/78 | HR 87 | Temp 98.8°F | Ht 62.0 in | Wt 148.6 lb

## 2021-09-15 DIAGNOSIS — N939 Abnormal uterine and vaginal bleeding, unspecified: Secondary | ICD-10-CM

## 2021-09-15 DIAGNOSIS — B9689 Other specified bacterial agents as the cause of diseases classified elsewhere: Secondary | ICD-10-CM | POA: Diagnosis not present

## 2021-09-15 DIAGNOSIS — N760 Acute vaginitis: Secondary | ICD-10-CM | POA: Diagnosis not present

## 2021-09-15 MED ORDER — FLUCONAZOLE 150 MG PO TABS
150.0000 mg | ORAL_TABLET | Freq: Once | ORAL | 0 refills | Status: AC
  Filled 2021-09-15: qty 1, 1d supply, fill #0

## 2021-09-15 MED ORDER — METRONIDAZOLE 500 MG PO TABS
500.0000 mg | ORAL_TABLET | Freq: Two times a day (BID) | ORAL | 0 refills | Status: DC
  Filled 2021-09-15: qty 14, 7d supply, fill #0

## 2021-09-15 MED ORDER — MEGESTROL ACETATE 40 MG PO TABS
40.0000 mg | ORAL_TABLET | Freq: Two times a day (BID) | ORAL | 0 refills | Status: DC
  Filled 2021-09-15: qty 14, 7d supply, fill #0

## 2021-09-15 NOTE — Progress Notes (Signed)
Erin Ponce, is a 26 y.o. female  [id number]  [id number]  DOB - 1996/08/12  Chief Complaint  Patient presents with   Menorrhagia    Saturating pad within an hour        Subjective:   [name] is a 26 y.o. female here today for a follow up visit on AUB. Menstrual cycle heavy for 39 days - having to change pads every hour with saturation. She is tired , light headed and dizzy at times.Patient has No headache, No chest pain, No abdominal pain - No Nausea, No new weakness tingling or numbness, No Cough - SOB.  No problems updated.  ALLERGIES: Allergies  Allergen Reactions   Latex Itching    PAST MEDICAL HISTORY: Past Medical History:  Diagnosis Date   Anxiety    Dislocated knee 2008   R knee   Miscarriage     MEDICATIONS AT HOME: Prior to Admission medications   Medication Sig Start Date End Date Taking? Authorizing Provider  megestrol (MEGACE) 40 MG tablet Take 1 tablet (40 mg total) by mouth 2 (two) times daily. 09/15/21  Yes Kerin Perna, NP  ARIPiprazole (ABILIFY) 5 MG tablet Take 1 tablet (5 mg total) by mouth daily. Patient not taking: Reported on 09/15/2021 05/20/21   Salley Slaughter, NP  benztropine (COGENTIN) 0.5 MG tablet Take 1 tablet (0.5 mg total) by mouth 2 (two) times daily. Patient not taking: Reported on 07/12/2021 02/17/21   Eulis Canner E, NP  hydrOXYzine (ATARAX/VISTARIL) 25 MG tablet TAKE 1 TABLET BY MOUTH EVERY 4 HOURS AS NEEDED FOR ANXIETY Patient not taking: Reported on 09/15/2021 05/20/21   Salley Slaughter, NP  traZODone (DESYREL) 100 MG tablet Take 1 tablet (100 mg total) by mouth at bedtime as needed. for sleep Patient not taking: Reported on 09/15/2021 05/20/21   Eulis Canner E, NP  Cetirizine HCl 10 MG CAPS Take 1 capsule (10 mg total) by mouth daily. 07/07/19 04/08/20  Wieters, Hallie C, PA-C    Objective:   Vitals:   09/15/21 1510  BP: 118/78  Pulse: 87  Temp: 98.8 F (37.1 C)  TempSrc: Oral   SpO2: 97%  Weight: 148 lb 9.6 oz (67.4 kg)  Height: 5\' 2"  (1.575 m)   Exam General appearance : Awake, alert, not in any distress. Speech Clear. Not toxic looking HEENT: Atraumatic and Normocephalic, pupils equally reactive to light and accomodation Neck: Supple, no JVD. No cervical lymphadenopathy.  Chest: Good air entry bilaterally, no added sounds  CVS: S1 S2 regular, no murmurs.  Abdomen: Bowel sounds present, Non tender and not distended with no gaurding, rigidity or rebound. Extremities: B/L Lower Ext shows no edema, both legs are warm to touch Neurology: Awake alert, and oriented X 3, CN II-XII intact, Non focal Skin: No Rash  Data Review Lab Results  Component Value Date   HGBA1C 5.7 (H) 10/28/2020   HGBA1C 5.4 04/03/2018    Assessment & Plan   Estrella was seen today for menorrhagia.  Diagnoses and all orders for this visit:  Abnormal uterine bleeding (AUB) -     megestrol (MEGACE) 40 MG tablet; Take 1 tablet (40 mg total) by mouth 2 (two) times daily. -     CBC with Differential -     Ambulatory referral to Gynecology  BV (bacterial vaginosis) -     metroNIDAZOLE (FLAGYL) 500 MG tablet; Take 1 tablet (500 mg total) by mouth 2 (two) times daily.  Other orders -  fluconazole (DIFLUCAN) 150 MG tablet; Take 1 tablet (150 mg total) by mouth once for 1 dose.    No follow-ups on file.  The patient was given clear instructions to go to ER or return to medical center if symptoms don't improve, worsen or new problems develop. The patient verbalized understanding. The patient was told to call to get lab results if they haven't heard anything in the next week.   This note has been created with Surveyor, quantity. Any transcriptional errors are unintentional.    Kerin Perna, 09/20/2021, 10:00 PM

## 2021-09-15 NOTE — Progress Notes (Signed)
Does not wish to continue depo injections does not wish to consider other options at this time

## 2021-09-16 LAB — CBC WITH DIFFERENTIAL/PLATELET
Basophils Absolute: 0 10*3/uL (ref 0.0–0.2)
Basos: 1 %
EOS (ABSOLUTE): 0.1 10*3/uL (ref 0.0–0.4)
Eos: 1 %
Hematocrit: 38.9 % (ref 34.0–46.6)
Hemoglobin: 12.4 g/dL (ref 11.1–15.9)
Immature Grans (Abs): 0 10*3/uL (ref 0.0–0.1)
Immature Granulocytes: 0 %
Lymphocytes Absolute: 2.6 10*3/uL (ref 0.7–3.1)
Lymphs: 47 %
MCH: 27.4 pg (ref 26.6–33.0)
MCHC: 31.9 g/dL (ref 31.5–35.7)
MCV: 86 fL (ref 79–97)
Monocytes Absolute: 0.4 10*3/uL (ref 0.1–0.9)
Monocytes: 6 %
Neutrophils Absolute: 2.5 10*3/uL (ref 1.4–7.0)
Neutrophils: 45 %
Platelets: 297 10*3/uL (ref 150–450)
RBC: 4.52 x10E6/uL (ref 3.77–5.28)
RDW: 13.2 % (ref 11.7–15.4)
WBC: 5.6 10*3/uL (ref 3.4–10.8)

## 2021-09-27 ENCOUNTER — Ambulatory Visit (INDEPENDENT_AMBULATORY_CARE_PROVIDER_SITE_OTHER): Payer: Medicaid Other

## 2021-10-02 ENCOUNTER — Encounter (INDEPENDENT_AMBULATORY_CARE_PROVIDER_SITE_OTHER): Payer: Self-pay | Admitting: Primary Care

## 2021-10-04 ENCOUNTER — Other Ambulatory Visit (INDEPENDENT_AMBULATORY_CARE_PROVIDER_SITE_OTHER): Payer: Self-pay | Admitting: Primary Care

## 2021-10-04 DIAGNOSIS — N939 Abnormal uterine and vaginal bleeding, unspecified: Secondary | ICD-10-CM

## 2021-11-02 ENCOUNTER — Encounter (INDEPENDENT_AMBULATORY_CARE_PROVIDER_SITE_OTHER): Payer: Self-pay | Admitting: Primary Care

## 2021-11-02 ENCOUNTER — Other Ambulatory Visit: Payer: Self-pay

## 2021-11-02 ENCOUNTER — Ambulatory Visit (INDEPENDENT_AMBULATORY_CARE_PROVIDER_SITE_OTHER): Payer: Medicaid Other | Admitting: Primary Care

## 2021-11-02 VITALS — BP 111/74 | HR 91 | Temp 98.0°F | Ht 62.0 in | Wt 145.8 lb

## 2021-11-02 DIAGNOSIS — N644 Mastodynia: Secondary | ICD-10-CM | POA: Diagnosis not present

## 2021-11-05 NOTE — Progress Notes (Signed)
Acute Office Visit  Subjective:    Patient ID: Erin Ponce, female    DOB: March 13, 1996, 26 y.o.   MRN: [id number]  Chief Complaint  Patient presents with   Breast Pain    HPI Erin Ponce is a 26 year old female in today for breast tenderness and pain. She is to start her menstrual cycle in the next few days. Patient's last menstrual period was 10/01/2021 (exact date).  Explained this is hormonal and normal if pain is present a week or 2 after her cycle reschedule an appt. Past Medical History:  Diagnosis Date   Anxiety    Dislocated knee 2008   R knee   Miscarriage     Past Surgical History:  Procedure Laterality Date   NO PAST SURGERIES      Family History  Problem Relation Age of Onset   Diabetes Mother    Thyroid disease Mother     Social History   Socioeconomic History   Marital status: Significant Other    Spouse name: Not on file   Number of children: 1   Years of education: 24   Highest education level: 10th grade  Occupational History   Occupation: Home health aide  Tobacco Use   Smoking status: Never   Smokeless tobacco: Never  Vaping Use   Vaping Use: Never used  Substance and Sexual Activity   Alcohol use: No   Drug use: Yes    Types: Marijuana    Comment: recently stopped   Sexual activity: Yes    Birth control/protection: None  Other Topics Concern   Not on file  Social History Narrative   Not on file   Social Determinants of Health   Financial Resource Strain: Not on file  Food Insecurity: Not on file  Transportation Needs: Not on file  Physical Activity: Not on file  Stress: Not on file  Social Connections: Not on file  Intimate Partner Violence: Not on file    Outpatient Medications Prior to Visit  Medication Sig Dispense Refill   hydrOXYzine (ATARAX/VISTARIL) 25 MG tablet TAKE 1 TABLET BY MOUTH EVERY 4 HOURS AS NEEDED FOR ANXIETY (Patient not taking: Reported on 09/15/2021) 90 tablet 3   traZODone (DESYREL) 100 MG  tablet Take 1 tablet (100 mg total) by mouth at bedtime as needed. for sleep (Patient not taking: Reported on 09/15/2021) 30 tablet 3   ARIPiprazole (ABILIFY) 5 MG tablet Take 1 tablet (5 mg total) by mouth daily. (Patient not taking: Reported on 09/15/2021) 30 tablet 3   benztropine (COGENTIN) 0.5 MG tablet Take 1 tablet (0.5 mg total) by mouth 2 (two) times daily. (Patient not taking: Reported on 07/12/2021) 60 tablet 2   megestrol (MEGACE) 40 MG tablet Take 1 tablet (40 mg total) by mouth 2 (two) times daily. 14 tablet 0   metroNIDAZOLE (FLAGYL) 500 MG tablet Take 1 tablet (500 mg total) by mouth 2 (two) times daily. 14 tablet 0   No facility-administered medications prior to visit.    Allergies  Allergen Reactions   Latex Itching    Review of Systems Comprehensive ROS Pertinent positive and negative noted in HPI      Objective:    Physical Exam Vitals reviewed.  Constitutional:      Appearance: Normal appearance. She is normal weight.  HENT:     Right Ear: External ear normal.     Left Ear: External ear normal.     Nose: Nose normal.  Cardiovascular:     Rate  and Rhythm: Normal rate and regular rhythm.  Pulmonary:     Effort: Pulmonary effort is normal.     Breath sounds: Normal breath sounds.  Abdominal:     General: Abdomen is flat. Bowel sounds are normal.     Palpations: Abdomen is soft.  Musculoskeletal:     Cervical back: Normal range of motion.  Neurological:     Mental Status: She is alert and oriented to person, place, and time.  Psychiatric:        Mood and Affect: Mood normal.        Behavior: Behavior normal.        Thought Content: Thought content normal.        Judgment: Judgment normal.   BP 111/74 (BP Location: Right Arm, Patient Position: Sitting, Cuff Size: Normal)    Pulse 91    Temp 98 F (36.7 C) (Oral)    Ht 5\' 2"  (1.575 m)    Wt 145 lb 12.8 oz (66.1 kg)    LMP 10/01/2021 (Exact Date)    SpO2 96%    BMI 26.67 kg/m  Wt Readings from Last 3  Encounters:  11/02/21 145 lb 12.8 oz (66.1 kg)  09/15/21 148 lb 9.6 oz (67.4 kg)  06/28/21 147 lb 12.8 oz (67 kg)    Health Maintenance Due  Topic Date Due   HPV VACCINES (1 - 2-dose series) Never done   COVID-19 Vaccine (2 - Pfizer series) 11/12/2020       Topic Date Due   HPV VACCINES (1 - 2-dose series) Never done     Lab Results  Component Value Date   TSH 0.935 10/28/2020   Lab Results  Component Value Date   WBC 5.6 09/15/2021   HGB 12.4 09/15/2021   HCT 38.9 09/15/2021   MCV 86 09/15/2021   PLT 297 09/15/2021   Lab Results  Component Value Date   NA 136 10/24/2020   K 3.8 10/24/2020   CO2 21 (L) 10/24/2020   GLUCOSE 92 10/24/2020   BUN 6 10/24/2020   CREATININE 0.85 10/24/2020   BILITOT 0.6 10/24/2020   ALKPHOS 46 10/24/2020   AST 17 10/24/2020   ALT 11 10/24/2020   PROT 7.6 10/24/2020   ALBUMIN 4.7 10/24/2020   CALCIUM 9.6 10/24/2020   ANIONGAP 10 10/24/2020   Lab Results  Component Value Date   CHOL 122 10/28/2020   Lab Results  Component Value Date   HDL 38 (L) 10/28/2020   Lab Results  Component Value Date   LDLCALC 72 10/28/2020   Lab Results  Component Value Date   TRIG 60 10/28/2020   Lab Results  Component Value Date   CHOLHDL 3.2 10/28/2020   Lab Results  Component Value Date   HGBA1C 5.7 (H) 10/28/2020       Assessment & Plan:  Jaxyn was seen today for breast pain.  Diagnoses and all orders for this visit:  Breast tenderness in female More than likely it's hormonal. Some women begin to have pain around the time of ovulation. The pain continues until the start of their menstrual cycle.     No orders of the defined types were placed in this encounter.    Donalee Citrin, NP

## 2022-04-28 ENCOUNTER — Telehealth (INDEPENDENT_AMBULATORY_CARE_PROVIDER_SITE_OTHER): Payer: Medicaid Other | Admitting: Psychiatry

## 2022-04-28 ENCOUNTER — Encounter (HOSPITAL_COMMUNITY): Payer: Self-pay | Admitting: Psychiatry

## 2022-04-28 ENCOUNTER — Other Ambulatory Visit: Payer: Self-pay

## 2022-04-28 DIAGNOSIS — F330 Major depressive disorder, recurrent, mild: Secondary | ICD-10-CM | POA: Diagnosis not present

## 2022-04-28 DIAGNOSIS — F411 Generalized anxiety disorder: Secondary | ICD-10-CM

## 2022-04-28 MED ORDER — HYDROXYZINE HCL 25 MG PO TABS
ORAL_TABLET | ORAL | 3 refills | Status: DC
  Filled 2022-04-28: qty 90, 15d supply, fill #0

## 2022-04-28 MED ORDER — QUETIAPINE FUMARATE 25 MG PO TABS
25.0000 mg | ORAL_TABLET | Freq: Every day | ORAL | 3 refills | Status: DC
  Filled 2022-04-28: qty 30, 30d supply, fill #0

## 2022-04-28 NOTE — Progress Notes (Signed)
[place] MD/PA/NP OP Progress Note Virtual Visit via Video Note  I connected with Erin Ponce on 04/28/22 at  2:00 PM EDT by a video enabled telemedicine application and verified that I am speaking with the correct person using two identifiers.  Location: Patient: Work ([employer] [facility]) Provider: Clinic   I discussed the limitations of evaluation and management by telemedicine and the availability of in person appointments. The patient expressed understanding and agreed to proceed.  I provided 30 minutes of non-face-to-face time during this encounter.    04/28/2022 2:54 PM Erin Ponce  MRN:  [id number]  Chief Complaint: "Lately I have been having outburst at work"  HPI: 26 year old female seen today for follow up psychiatric evaluation.   She has a psychiatric history of brief psychotic disorder, depression, and psychosis.  She is currently not managed on medications. She was last prescribed hydroxyzine 25 mg 3 times daily as needed, Abilify 5 mg daily,and trazodone 100 mg nightly as needed a year ago.     Today was well groomed, pleasant, cooperative, engaged in conversation, and maintaining good eye contact. She informed that she has been having outburst at work.  She notes that she is unable to control her emotions.  Patient informed Probation officer that she has been working at [employer] since March.  She reports that she finds enjoyment in her job but is worried about her behavior at work.  She also notes that she worries about coparenting with her child's father.  Today provider conducted a GAD-7 and patient scored a 14.   patient notes that her depression continues to be bothersome and reports that her mood fluctuates.  At times she notes that she is irritable, distractible, having racing thoughts.  Writer conducted PHQ-9 and patient scored a 13.  She notes that her sleep is poor reporting sleeping 2 to 3 hours nightly.  Patient reports her appetite has been poor noting that she is lost around  13 pounds in the last 3 months.  Today she denies SI/HI/AVAH or paranoia.  Patient was on Risperdal in the past and found it effective however notes that it caused increased prolactin levels and leakage of her breast.  She informed writer that she did not find Abilify effective and would like to try something else.  Today she is agreeable to starting Seroquel 25 mg daily to help manage mood, anxiety, sleep, and appetite.  Hydroxyzine 25 mg 3 times daily restarted to help manage anxiety. Potential side effects of medication and risks vs benefits of treatment vs non-treatment were explained and discussed. All questions were answered. No other concerns at this time.     Visit Diagnosis:    ICD-10-CM   1. Mild episode of recurrent major depressive disorder (HCC)  F33.0 QUEtiapine (SEROQUEL) 25 MG tablet    2. Generalized anxiety disorder  F41.1 hydrOXYzine (ATARAX) 25 MG tablet      Past Psychiatric History: brief psychotic disorder, depression, and psychosis  Past Medical History:  Past Medical History:  Diagnosis Date   Anxiety    Dislocated knee 2008   R knee   Miscarriage     Past Surgical History:  Procedure Laterality Date   NO PAST SURGERIES      Family Psychiatric History: Denies  Family History:  Family History  Problem Relation Age of Onset   Diabetes Mother    Thyroid disease Mother     Social History:  Social History   Socioeconomic History   Marital status: Significant Other  Spouse name: Not on file   Number of children: 1   Years of education: 57   Highest education level: 10th grade  Occupational History   Occupation: Home health aide  Tobacco Use   Smoking status: Never   Smokeless tobacco: Never  Vaping Use   Vaping Use: Never used  Substance and Sexual Activity   Alcohol use: No   Drug use: Yes    Types: Marijuana    Comment: recently stopped   Sexual activity: Yes    Birth control/protection: None  Other Topics Concern   Not on file   Social History Narrative   Not on file   Social Determinants of Health   Financial Resource Strain: Not on file  Food Insecurity: Not on file  Transportation Needs: Not on file  Physical Activity: Not on file  Stress: Not on file  Social Connections: Not on file    Allergies:  Allergies  Allergen Reactions   Latex Itching    Metabolic Disorder Labs: Lab Results  Component Value Date   HGBA1C 5.7 (H) 10/28/2020   MPG 116.89 10/28/2020   MPG 108.28 04/03/2018   Lab Results  Component Value Date   PROLACTIN 195.0 (H) 11/23/2020   PROLACTIN 160.1 (H) 04/03/2018   Lab Results  Component Value Date   CHOL 122 10/28/2020   TRIG 60 10/28/2020   HDL 38 (L) 10/28/2020   CHOLHDL 3.2 10/28/2020   VLDL 12 10/28/2020   LDLCALC 72 10/28/2020   LDLCALC 79 04/03/2018   Lab Results  Component Value Date   TSH 0.935 10/28/2020   TSH 1.228 03/31/2018    Therapeutic Level Labs: No results found for: "LITHIUM" No results found for: "VALPROATE" No results found for: "CBMZ"  Current Medications: Current Outpatient Medications  Medication Sig Dispense Refill   QUEtiapine (SEROQUEL) 25 MG tablet Take 1 tablet (25 mg total) by mouth at bedtime. 30 tablet 3   hydrOXYzine (ATARAX) 25 MG tablet TAKE 1 TABLET BY MOUTH EVERY 4 HOURS AS NEEDED FOR ANXIETY 90 tablet 3   No current facility-administered medications for this visit.     Musculoskeletal: Strength & Muscle Tone: within normal limits and telehealth visit Gait & Station: normal,  telehealth visit Patient leans: N/A  Psychiatric Specialty Exam: Review of Systems  There were no vitals taken for this visit.There is no height or weight on file to calculate BMI.  General Appearance: Well Groomed  Eye Contact:  Good  Speech:  Clear and Coherent and Normal Rate  Volume:  Normal  Mood:  Anxious and Depressed  Affect:  Appropriate and Congruent  Thought Process:  Coherent, Goal Directed, and Linear  Orientation:  Full  (Time, Place, and Person)  Thought Content: WDL and Logical   Suicidal Thoughts:  No  Homicidal Thoughts:  No  Memory:  Immediate;   Good Recent;   Good Remote;   Good  Judgement:  Good  Insight:  Good  Psychomotor Activity:  Normal  Concentration:  Concentration: Good and Attention Span: Good  Recall:  Good  Fund of Knowledge: Good  Language: Good  Akathisia:  No  Handed:  Right  AIMS (if indicated): not done  Assets:  Communication Skills Desire for Improvement Financial Resources/Insurance Housing Leisure Time Physical Health Social Support Vocational/Educational  ADL's:  Intact  Cognition: WNL  Sleep:  Poor   Screenings: AIMS    Flowsheet Row [facility]) [facility]  AIMS Total Score 0 0      AUDIT    Flowsheet Row [facility]) [facility] Ad[facility] ([facility]  Alcohol Use Disorder Identification Test Final Score (AUDIT) 0 0      GAD-7    Flowsheet Row Video Visit from 04/28/2022 in [facility] Office Visit from 11/02/2021 in [facility] Office Visit from 09/15/2021 in [facility] Office Visit from 07[facility]12/2021 in [facility] Office Visit from 06/28/2021 in [facility]  Total GAD-7 Score 14 0 0 0 0      PHQ2-9    Flowsheet Row Video Visit from 04/28/2022 in [facility] Office Visit from 11/02/2021 in [facility] Office Visit from 09/15/2021 in [facility] Office Visit from 07/12/2021 in [facility] Office Visit from 06/28/2021 in [facility]  PHQ-2 Total  Score 2 0 0 0 0  PHQ-9 Total Score 13 -- -- 0 --      Flowsheet Row Video Visit from 04/28/2022 in [facility] ED from 09/01/2021 in [facility] ED from 06/08/2021 in [facility] RISK CATEGORY Error: Question 6 not populated No Risk No Risk        Assessment and Plan: Patient endorses symptoms of anxiety, depression, and insomnia. Patient was on Risperdal in the past and found it effective however notes that it caused increased prolactin levels and leakage of her breast.  She informed writer that she did not find Abilify effective and would like to try something else.  Today she is agreeable to starting Seroquel 25 mg daily to help manage mood, anxiety, sleep, and appetite.  Hydroxyzine 25 mg 3 times daily restarted to help manage anxiety  1. Mild episode of recurrent major depressive disorder (HCC)  Start- QUEtiapine (SEROQUEL) 25 MG tablet; Take 1 tablet (25 mg total) by mouth at bedtime.  Dispense: 30 tablet; Refill: 3  2. Generalized anxiety disorder  Restart- hydrOXYzine (ATARAX) 25 MG tablet; TAKE 1 TABLET BY MOUTH EVERY 4 HOURS AS NEEDED FOR ANXIETY  Dispense: 90 tablet; Refill: 3   Follow-up in 3 months Follow-up with therapy    Shanna Cisco, NP 04/28/2022, 2:54 PM

## 2022-07-21 ENCOUNTER — Other Ambulatory Visit: Payer: Self-pay

## 2022-07-21 ENCOUNTER — Encounter (HOSPITAL_COMMUNITY): Payer: Self-pay | Admitting: Psychiatry

## 2022-07-21 ENCOUNTER — Telehealth (INDEPENDENT_AMBULATORY_CARE_PROVIDER_SITE_OTHER): Payer: Medicaid Other | Admitting: Psychiatry

## 2022-07-21 DIAGNOSIS — F411 Generalized anxiety disorder: Secondary | ICD-10-CM | POA: Diagnosis not present

## 2022-07-21 MED ORDER — HYDROXYZINE HCL 25 MG PO TABS
25.0000 mg | ORAL_TABLET | ORAL | 3 refills | Status: DC | PRN
  Filled 2022-07-21: qty 90, 15d supply, fill #0

## 2022-07-21 NOTE — Progress Notes (Signed)
[facility] MD/PA/NP OP Progress Note Virtual Visit via Telephone Note  I connected with Erin Ponce on 07/21/22 at 11:30 AM EST by telephone and verified that I am speaking with the correct person using two identifiers.  Location: Patient: home Provider: Clinic   I discussed the limitations, risks, security and privacy concerns of performing an evaluation and management service by telephone and the availability of in person appointments. I also discussed with the patient that there may be a patient responsible charge related to this service. The patient expressed understanding and agreed to proceed.   I provided 30 minutes of non-face-to-face time during this encounter.     07/21/2022 9:47 AM Erin Ponce  MRN:  [id number]  Chief Complaint: "I did not feel that the Seroquel worked"  HPI: 26 year old female seen today for follow up psychiatric evaluation.   She has a psychiatric history of brief psychotic disorder, depression, and psychosis.  She is currently managed on medication hydroxyzine 25 mg 3 times daily as needed,and Seroquel 25 mg nightly.  She informed Clinical research associate that she discontinued Seroquel.   Today was unable to logon virtually sources with time.  During exam she was pleasant, cooperative, and engaged in conversation.  She informed Clinical research associate that she discontinued Seroquel because she did not feel that it was working.  Patient notes that she has only been taking hydroxyzine and reports that it manages her mood and anxiety at work.  Since her last visit she notes that work has been less stressful.  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  She informed Clinical research associate that she has minimal anxiety and depression.  Provider conducted a GAD-7 and patient scored a 6, at her last visit she scored a 14.  Provider also conducted PHQ-9 patient scored an 8, at her last visit she scored a 13.  She endorses adequate sleep and appetite.  Today she denies SI/HI/VAH, mania,  paranoia.    At this time patient does not wish to restart Seroquel.  She will continue hydroxyzine as prescribed. No other concerns at this time.     Visit Diagnosis:    ICD-10-CM   1. Generalized anxiety disorder  F41.1 hydrOXYzine (ATARAX) 25 MG tablet      Past Psychiatric History: brief psychotic disorder, depression, and psychosis  Past Medical History:  Past Medical History:  Diagnosis Date   Anxiety    Dislocated knee 2008   R knee   Miscarriage     Past Surgical History:  Procedure Laterality Date   NO PAST SURGERIES      Family Psychiatric History: Denies  Family History:  Family History  Problem Relation Age of Onset   Diabetes Mother    Thyroid disease Mother     Social History:  Social History   Socioeconomic History   Marital status: Significant Other    Spouse name: Not on file   Number of children: 1   Years of education: 12   Highest education level: 10th grade  Occupational History   Occupation: Home health aide  Tobacco Use   Smoking status: Never   Smokeless tobacco: Never  Vaping Use   Vaping Use: Never used  Substance and Sexual Activity   Alcohol use: No   Drug use: Yes    Types: Marijuana    Comment: recently stopped   Sexual activity: Yes    Birth control/protection: None  Other Topics Concern   Not on file  Social History Narrative   Not  on file   Social Determinants of Health   Financial Resource Strain: Not on file  Food Insecurity: Not on file  Transportation Needs: Not on file  Physical Activity: Not on file  Stress: Not on file  Social Connections: Not on file    Allergies:  Allergies  Allergen Reactions   Latex Itching    Metabolic Disorder Labs: Lab Results  Component Value Date   HGBA1C 5.7 (H) 10/28/2020   MPG 116.89 10/28/2020   MPG 108.28 04/03/2018   Lab Results  Component Value Date   PROLACTIN 195.0 (H) 11/23/2020   PROLACTIN 160.1 (H) 04/03/2018   Lab Results  Component Value Date    CHOL 122 10/28/2020   TRIG 60 10/28/2020   HDL 38 (L) 10/28/2020   CHOLHDL 3.2 10/28/2020   VLDL 12 10/28/2020   LDLCALC 72 10/28/2020   LDLCALC 79 04/03/2018   Lab Results  Component Value Date   TSH 0.935 10/28/2020   TSH 1.228 03/31/2018    Therapeutic Level Labs: No results found for: "LITHIUM" No results found for: "VALPROATE" No results found for: "CBMZ"  Current Medications: Current Outpatient Medications  Medication Sig Dispense Refill   hydrOXYzine (ATARAX) 25 MG tablet TAKE 1 TABLET BY MOUTH EVERY 4 HOURS AS NEEDED FOR ANXIETY 90 tablet 3   No current facility-administered medications for this visit.     Musculoskeletal: Strength & Muscle Tone:  Unable to assess due to telephone visit Gait & Station:  Unable to assess due to telephone visit Patient leans: N/A  Psychiatric Specialty Exam: Review of Systems  There were no vitals taken for this visit.There is no height or weight on file to calculate BMI.  General Appearance:  Unable to assess due to telephone visit  Eye Contact:   Unable to assess due to telephone visit  Speech:  Clear and Coherent and Normal Rate  Volume:  Normal  Mood:  Euthymic  Affect:  Appropriate and Congruent  Thought Process:  Coherent, Goal Directed, and Linear  Orientation:  Full (Time, Place, and Person)  Thought Content: WDL and Logical   Suicidal Thoughts:  No  Homicidal Thoughts:  No  Memory:  Immediate;   Good Recent;   Good Remote;   Good  Judgement:  Good  Insight:  Good  Psychomotor Activity:   Unable to assess due to telephone visit  Concentration:  Concentration: Good and Attention Span: Good  Recall:  Good  Fund of Knowledge: Good  Language: Good  Akathisia:   Unable to assess due to telephone visit  Handed:  Right  AIMS (if indicated): not done  Assets:  Communication Skills Desire for Improvement Financial Resources/Insurance Housing Leisure Time Physical Health Social Support Vocational/Educational   ADL's:  Intact  Cognition: WNL  Sleep:  Good   Screenings: AIMS    Flowsheet Row [facility]) [facility]  AIMS Total Score 0 0      AUDIT    Flowsheet Row [facility]) [facility] Ad[facility] ([facility]  Alcohol Use Disorder Identification Test Final Score (AUDIT) 0 0      GAD-7    Flowsheet Row Video Visit from 07/21/2022 in [facility] Video Visit from 04/28/2022 in [facility] Office Visit from 11/02/2021 in [facility] Office Visit  from 09/15/2021 in [facility] Office Visit from 07/12/2021 in [facility]  Total GAD-7 Score 6 14 0 0 0      PHQ2-9    Flowsheet Row Video Visit from 07/21/2022 in [facility] Video Visit from 04/28/2022 in [facility] Office Visit from 11/02/2021 in [facility] Office Visit from 09/15/2021 in [facility] Office Visit from 07/12/2021 in [facility]  PHQ-2 Total Score 2 2 0 0 0  PHQ-9 Total Score 8 13 -- -- 0      Flowsheet Row Video Visit from 04/28/2022 in [facility] ED from 09/01/2021 in [facility] ED from 06/08/2021 in [facility] RISK CATEGORY Error: Question 6 not populated No Risk No Risk        Assessment and Plan: Patient reports that her anxiety, depression, and sleep has improved since her last visit.  She notes that she discontinued Seroquel because she found it ineffective.At this time patient does not wish to restart Seroquel.   She will continue hydroxyzine as prescribed.   1. Generalized anxiety disorder  Continue- hydrOXYzine (ATARAX) 25 MG tablet; TAKE 1 TABLET BY MOUTH EVERY 4 HOURS AS NEEDED FOR ANXIETY  Dispense: 90 tablet; Refill: 3    Follow-up in 3 months Follow-up with therapy    Shanna Cisco, NP 07/21/2022, 9:47 AM

## 2022-07-27 ENCOUNTER — Other Ambulatory Visit: Payer: Self-pay

## 2022-10-03 ENCOUNTER — Encounter (INDEPENDENT_AMBULATORY_CARE_PROVIDER_SITE_OTHER): Payer: Self-pay | Admitting: Primary Care

## 2022-10-03 ENCOUNTER — Ambulatory Visit (INDEPENDENT_AMBULATORY_CARE_PROVIDER_SITE_OTHER): Payer: Medicaid Other | Admitting: Primary Care

## 2022-10-03 VITALS — BP 127/81 | HR 83 | Resp 16 | Ht 62.0 in | Wt 130.2 lb

## 2022-10-03 DIAGNOSIS — Z6823 Body mass index (BMI) 23.0-23.9, adult: Secondary | ICD-10-CM

## 2022-10-03 DIAGNOSIS — R7303 Prediabetes: Secondary | ICD-10-CM | POA: Diagnosis not present

## 2022-10-03 DIAGNOSIS — Z1159 Encounter for screening for other viral diseases: Secondary | ICD-10-CM

## 2022-10-03 DIAGNOSIS — N644 Mastodynia: Secondary | ICD-10-CM

## 2022-10-03 DIAGNOSIS — Z79899 Other long term (current) drug therapy: Secondary | ICD-10-CM

## 2022-10-03 DIAGNOSIS — Z23 Encounter for immunization: Secondary | ICD-10-CM | POA: Diagnosis not present

## 2022-10-03 DIAGNOSIS — Z13220 Encounter for screening for lipoid disorders: Secondary | ICD-10-CM

## 2022-10-03 DIAGNOSIS — Z7185 Encounter for immunization safety counseling: Secondary | ICD-10-CM | POA: Diagnosis not present

## 2022-10-03 DIAGNOSIS — N921 Excessive and frequent menstruation with irregular cycle: Secondary | ICD-10-CM

## 2022-10-03 NOTE — Progress Notes (Signed)
Erin Ponce, is a 27 y.o. female  NWG:[id number]  [id number]:[id number]  DOB - 29-May-1996  Chief Complaint  Patient presents with   Breast Pain    On and off menstrual cycles Started Sep 04 2022  Using heating pads        Subjective:   Erin Ponce is a 28 y.o. sexually active sexual female with is here today for concerns of breast pain left greater than right.  She also voices concern that her her mother sister has been diagnosed and treated for breast cancer.  Today we did separate breast examinations patient was able to demonstrate and voiced understanding and to no when to be concerned.  Pain or mass prior to or after menstrual cycle are normal.  She will use the clock to inform writer if she feels something other than those time.Patient has No headache, No chest pain, No abdominal pain - No Nausea, No new weakness tingling or numbness, No Cough - shortness of breath  No problems updated.  Allergies  Allergen Reactions   Latex Itching    Past Medical History:  Diagnosis Date   Anxiety    Dislocated knee 2008   R knee   Miscarriage     Current Outpatient Medications on File Prior to Visit  Medication Sig Dispense Refill   hydrOXYzine (ATARAX) 25 MG tablet Take 1 tablet (25 mg total) by mouth every 4 (four) hours as needed for anxiety. 90 tablet 3   [DISCONTINUED] Cetirizine HCl 10 MG CAPS Take 1 capsule (10 mg total) by mouth daily. 20 capsule 0   No current facility-administered medications on file prior to visit.    Objective:   Vitals:   10/03/22 1018  BP: 127/81  Pulse: 83  Resp: 16  SpO2: 96%  Weight: 130 lb 3.2 oz (59.1 kg)  Height: 5\' 2"  (1.575 m)    Exam General appearance : Awake, alert, not in any distress. Speech Clear. Not toxic looking HEENT: Atraumatic and Normocephalic, pupils equally reactive to light and accomodation Neck: Supple, no JVD. No cervical lymphadenopathy.  Chest: Breast exam - Good air entry  bilaterally, no added sounds  CVS: S1 S2 regular, no murmurs.  Abdomen: Bowel sounds present, Non tender and not distended with no gaurding, rigidity or rebound. Extremities: B/L Lower Ext shows no edema, both legs are warm to touch Neurology: Awake alert, and oriented X 3, CN II-XII intact, Non focal Skin: No Rash  Data Review Lab Results  Component Value Date   HGBA1C 5.7 (H) 10/28/2020   HGBA1C 5.4 04/03/2018    Assessment & Plan  Erin Ponce was seen today for breast pain.  Diagnoses and all orders for this visit:  Need for immunization against influenza -     Flu Vaccine QUAD 70mo+IM (Fluarix, Fluzone & Alfiuria Quad PF)  Breast tenderness in female Demonstrated to be SBE return demonstration.  Also aware that breast are tender and full prior to menstrual cycle and may have palpable masses during that time which is hormone outside at this time she is to use clock for location and position  Prediabetes -     CBC with Differential/Platelet -     Hemoglobin A1c   Medication management -     CMP14+EGFR  Menorrhagia with irregular cycle On and off menstrual cycles Started Sep 04 2022  Using heating pads  -     CBC with Differential/Platelet  Encounter for HCV screening test for low risk patient -  HCV Ab w Reflex to Quant PCR  Lipid screening -     Lipid panel    BMI 23.0-23.9, adult  BMI wnl  Patient have been counseled extensively about nutrition and exercise. Other issues discussed during this visit include: low cholesterol diet, weight control and daily exercise, foot care, annual eye examinations at Ophthalmology, importance of adherence with medications and regular follow-up. We also discussed long term complications of uncontrolled diabetes and hypertension.   Return for annual physical.  The patient was given clear instructions to go to ER or return to medical center if symptoms don't improve, worsen or new problems develop. The patient verbalized  understanding. The patient was told to call to get lab results if they haven't heard anything in the next week.   This note has been created with Surveyor, quantity. Any transcriptional errors are unintentional.   Kerin Perna, NP 10/03/2022, 2:10 PM

## 2022-10-04 ENCOUNTER — Telehealth: Payer: Self-pay | Admitting: Emergency Medicine

## 2022-10-04 LAB — CBC WITH DIFFERENTIAL/PLATELET
Basophils Absolute: 0 10*3/uL (ref 0.0–0.2)
Basos: 0 %
EOS (ABSOLUTE): 0 10*3/uL (ref 0.0–0.4)
Eos: 1 %
Hematocrit: 38.7 % (ref 34.0–46.6)
Hemoglobin: 12.6 g/dL (ref 11.1–15.9)
Immature Grans (Abs): 0 10*3/uL (ref 0.0–0.1)
Immature Granulocytes: 0 %
Lymphocytes Absolute: 2.3 10*3/uL (ref 0.7–3.1)
Lymphs: 44 %
MCH: 27.8 pg (ref 26.6–33.0)
MCHC: 32.6 g/dL (ref 31.5–35.7)
MCV: 85 fL (ref 79–97)
Monocytes Absolute: 0.4 10*3/uL (ref 0.1–0.9)
Monocytes: 7 %
Neutrophils Absolute: 2.5 10*3/uL (ref 1.4–7.0)
Neutrophils: 48 %
Platelets: 313 10*3/uL (ref 150–450)
RBC: 4.53 x10E6/uL (ref 3.77–5.28)
RDW: 13.6 % (ref 11.7–15.4)
WBC: 5.1 10*3/uL (ref 3.4–10.8)

## 2022-10-04 LAB — CMP14+EGFR
ALT: 6 IU/L (ref 0–32)
AST: 12 IU/L (ref 0–40)
Albumin/Globulin Ratio: 2.2 (ref 1.2–2.2)
Albumin: 4.8 g/dL (ref 4.0–5.0)
Alkaline Phosphatase: 58 IU/L (ref 44–121)
BUN/Creatinine Ratio: 13 (ref 9–23)
BUN: 9 mg/dL (ref 6–20)
Bilirubin Total: 0.4 mg/dL (ref 0.0–1.2)
CO2: 19 mmol/L — ABNORMAL LOW (ref 20–29)
Calcium: 9.6 mg/dL (ref 8.7–10.2)
Chloride: 105 mmol/L (ref 96–106)
Creatinine, Ser: 0.71 mg/dL (ref 0.57–1.00)
Globulin, Total: 2.2 g/dL (ref 1.5–4.5)
Glucose: 91 mg/dL (ref 70–99)
Potassium: 4.5 mmol/L (ref 3.5–5.2)
Sodium: 142 mmol/L (ref 134–144)
Total Protein: 7 g/dL (ref 6.0–8.5)
eGFR: 120 mL/min/{1.73_m2} (ref >59)

## 2022-10-04 LAB — LIPID PANEL
Chol/HDL Ratio: 3 ratio (ref 0.0–4.4)
Cholesterol, Total: 146 mg/dL (ref 100–199)
HDL: 48 mg/dL (ref >39)
LDL Chol Calc (NIH): 87 mg/dL (ref 0–99)
Triglycerides: 52 mg/dL (ref 0–149)
VLDL Cholesterol Cal: 11 mg/dL (ref 5–40)

## 2022-10-04 LAB — HEMOGLOBIN A1C
Est. average glucose Bld gHb Est-mCnc: 120 mg/dL
Hgb A1c MFr Bld: 5.8 % — ABNORMAL HIGH (ref 4.8–5.6)

## 2022-10-04 LAB — HCV AB W REFLEX TO QUANT PCR: HCV Ab: NONREACTIVE

## 2022-10-04 LAB — HCV INTERPRETATION

## 2022-10-04 NOTE — Telephone Encounter (Signed)
Copied from [place] [phone]. Topic: General - Inquiry >> Oct 04, 2022 12:17 PM Erin Ponce wrote: Reason for CRM:Pt is requesting a call back to discuss recent labs. Please advise.

## 2022-10-05 NOTE — Telephone Encounter (Signed)
Returned pt call to go over lab results pt states she saw provider message in regards to them

## 2022-10-19 ENCOUNTER — Encounter (HOSPITAL_COMMUNITY): Payer: Self-pay | Admitting: Psychiatry

## 2022-10-19 ENCOUNTER — Telehealth (INDEPENDENT_AMBULATORY_CARE_PROVIDER_SITE_OTHER): Payer: Medicaid Other | Admitting: Psychiatry

## 2022-10-19 DIAGNOSIS — F325 Major depressive disorder, single episode, in full remission: Secondary | ICD-10-CM

## 2022-10-19 NOTE — Progress Notes (Signed)
BH MD/PA/NP OP Progress Note Virtual Visit via Video Note  I connected with Erin Ponce on 10/19/22 at 11:00 AM EST by a video enabled telemedicine application and verified that I am speaking with the correct person using two identifiers.  Location: Patient: Home Provider: Clinic   I discussed the limitations of evaluation and management by telemedicine and the availability of in person appointments. The patient expressed understanding and agreed to proceed.  I provided 30 minutes of non-face-to-face time during this encounter.       10/19/2022 11:08 AM Erin Ponce  MRN:  [id number]  Chief Complaint: "I stopped my medications"  HPI: 27 year old female seen today for follow up psychiatric evaluation.   She has a psychiatric history of brief psychotic disorder, depression, and psychosis.  She is currently managed on medication hydroxyzine 25 mg 3 times daily as needed.  She informed Probation officer that she discontinued her medications a month ago.   Today was well-groomed, pleasant, cooperative, engaged in conversation.  She informed Probation officer that over a month ago she discontinued her medications and notes that she feels mentally stable.  Patient denies symptoms of anxiety and depression today.  Provider conducted a GAD-7 and patient scored a 0, at her last visit she scored a 6.  Provider also conducted PHQ-9 the patient scored a 0, at her last visit she scored an 8.  She endorses adequate sleep and appetite.  Today she denies SI/HI/VAH, mania, paranoia.   Patient informed writer that there are some things that are out of her control.  She notes that she and her ex-boyfriend recently broke up.  She notes that she moved out of their home and now is living with her mother.  She reports that she is looking for her and her daughter place to stay.  At this time patient does not wish to restart hydroxyzine. No other concerns at this time.     Visit Diagnosis:    ICD-10-CM   1. 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       Past Psychiatric History: brief psychotic disorder, depression, and psychosis  Past Medical History:  Past Medical History:  Diagnosis Date   Anxiety    Dislocated knee 2008   R knee   Miscarriage     Past Surgical History:  Procedure Laterality Date   NO PAST SURGERIES      Family Psychiatric History: Denies  Family History:  Family History  Problem Relation Age of Onset   Diabetes Mother    Thyroid disease Mother     Social History:  Social History   Socioeconomic History   Marital status: Significant Other    Spouse name: Not on file   Number of children: 1   Years of education: 12   Highest education level: 10th grade  Occupational History   Occupation: Home health aide  Tobacco Use   Smoking status: Never   Smokeless tobacco: Never  Vaping Use   Vaping Use: Never used  Substance and Sexual Activity   Alcohol use: No   Drug use: Yes    Types: Marijuana    Comment: recently stopped   Sexual activity: Yes    Birth control/protection: None  Other Topics Concern   Not on file  Social History Narrative   Not on file   Social Determinants of Health   Financial Resource Strain: Not on file  Food Insecurity: Not on file  Transportation Needs: Not on file  Physical Activity: Not on file  Stress:  Not on file  Social Connections: Not on file    Allergies:  Allergies  Allergen Reactions   Latex Itching    Metabolic Disorder Labs: Lab Results  Component Value Date   HGBA1C 5.8 (H) 10/03/2022   MPG 116.89 10/28/2020   MPG 108.28 04/03/2018   Lab Results  Component Value Date   PROLACTIN 195.0 (H) 11/23/2020   PROLACTIN 160.1 (H) 04/03/2018   Lab Results  Component Value Date   CHOL 146 10/03/2022   TRIG 52 10/03/2022   HDL 48 10/03/2022   CHOLHDL 3.0 10/03/2022   VLDL 12 10/28/2020   LDLCALC 87 10/03/2022   LDLCALC 72 10/28/2020   Lab Results  Component Value Date   TSH 0.935 10/28/2020   TSH  1.228 03/31/2018    Therapeutic Level Labs: No results found for: "LITHIUM" No results found for: "VALPROATE" No results found for: "CBMZ"  Current Medications: Current Outpatient Medications  Medication Sig Dispense Refill   hydrOXYzine (ATARAX) 25 MG tablet Take 1 tablet (25 mg total) by mouth every 4 (four) hours as needed for anxiety. 90 tablet 3   No current facility-administered medications for this visit.     Musculoskeletal: Strength & Muscle Tone: within normal limits and telehealth visit Gait & Station: normal, telehealth visit Patient leans: N/A  Psychiatric Specialty Exam: Review of Systems  Last menstrual period 09/29/2022.There is no height or weight on file to calculate BMI.  General Appearance: Well Groomed  Eye Contact:  Good  Speech:  Clear and Coherent and Normal Rate  Volume:  Normal  Mood:  Euthymic  Affect:  Appropriate and Congruent  Thought Process:  Coherent, Goal Directed, and Linear  Orientation:  Full (Time, Place, and Person)  Thought Content: WDL and Logical   Suicidal Thoughts:  No  Homicidal Thoughts:  No  Memory:  Immediate;   Good Recent;   Good Remote;   Good  Judgement:  Good  Insight:  Good  Psychomotor Activity:  Normal  Concentration:  Concentration: Good and Attention Span: Good  Recall:  Good  Fund of Knowledge: Good  Language: Good  Akathisia:  No  Handed:  Right  AIMS (if indicated): not done  Assets:  Communication Skills Desire for Improvement Financial Resources/Insurance Housing Leisure Time Physical Health Social Support Vocational/Educational  ADL's:  Intact  Cognition: WNL  Sleep:  Good   Screenings: AIMS    Flowsheet Row Admission (Discharged) from 10/27/2020 in [place] [facility] Admission (Discharged) from 03/30/2018 in [place] Total Score 0 0      AUDIT    Flowsheet Row Admission (Discharged) from 10/27/2020 in [place] [facility] Admission (Discharged) from 03/30/2018 in [place] [facility]  Alcohol Use Disorder Identification Test Final Score (AUDIT) 0 0      GAD-7    Flowsheet Row Video Visit from 10/19/2022 in [facility] Office Visit from 10/03/2022 in [place] Video Visit from 07/21/2022 in [facility] Video Visit from 04/28/2022 in [facility] Office Visit from 11/02/2021 in [place]  Total GAD-7 Score 0 0 6 14 0      PHQ2-9    Flowsheet Row Video Visit from 10/19/2022 in [facility] Office Visit from 10/03/2022 in [place] Video Visit from 07/21/2022 in [facility] Video Visit from  04/28/2022 in [facility] Office Visit from 11/02/2021 in [place]  PHQ-2 Total Score 0 0 2 2 0  PHQ-9 Total Score 0 -- 8 13 --      Flowsheet Row Video Visit from 04/28/2022 in [facility] ED from 09/01/2021 in [facility] ED from 06/08/2021 in [facility] [place] Error: Question 6 not populated No Risk No Risk        Assessment and Plan: Patient reports that she discontinued her medications a month ago and does not wish to restart hydroxyzine.  1. Depression, major, in remission ([place])     Follow-up in 3 months Follow-up with therapy    Salley Slaughter, NP 10/19/2022, 11:08 AM

## 2022-11-22 ENCOUNTER — Other Ambulatory Visit (HOSPITAL_COMMUNITY): Payer: Self-pay

## 2023-01-11 ENCOUNTER — Telehealth (INDEPENDENT_AMBULATORY_CARE_PROVIDER_SITE_OTHER): Payer: Medicaid Other | Admitting: Psychiatry

## 2023-01-11 ENCOUNTER — Encounter (HOSPITAL_COMMUNITY): Payer: Self-pay | Admitting: Psychiatry

## 2023-01-11 DIAGNOSIS — F325 Major depressive disorder, single episode, in full remission: Secondary | ICD-10-CM | POA: Diagnosis not present

## 2023-01-11 NOTE — Progress Notes (Signed)
BH MD/PA/NP OP Progress Note Virtual Visit via Video Note  I connected with Erin Ponce on 01/11/23 at  1:00 PM EDT by a video enabled telemedicine application and verified that I am speaking with the correct person using two identifiers.  Location: Patient: Home Provider: Clinic   I discussed the limitations of evaluation and management by telemedicine and the availability of in person appointments. The patient expressed understanding and agreed to proceed.  I provided 30 minutes of non-face-to-face time during this encounter.       01/11/2023 1:14 PM Erin Ponce  MRN:  [id number]  Chief Complaint: "I feel good"  HPI: 27 year old female seen today for follow up psychiatric evaluation.   She has a psychiatric history of brief psychotic disorder, depression, and psychosis.  She is currently not managed on medications and reports she feels mentally stable.    Today was well-groomed, pleasant, cooperative, engaged in conversation.  She informed Clinical research associate that she feels good. She notes that her mood sis stable and denies symptoms of anxiety and depression today.  Provider conducted a GAD-7 and patient scored a 0, at her last visit she scored a 0.  Provider also conducted PHQ-9 the patient scored a 0, at her last visit she scored an 0.  She endorses adequate sleep and appetite.  Today she denies SI/HI/VAH, mania, paranoia.   Patient informed Clinical research associate that she recently moved in with her boyfriend.  She she notes that she and her daughter are doing well.  She also informed Clinical research associate that the relationship is going well.    At this time patient reports that she is not in need of psychiatric services.  She will follow-up as needed.  No other concerns at this time.    Visit Diagnosis:    ICD-10-CM   1. Depression, major, in remission (H[facility])  F32.5       Past Psychiatric History: brief psychotic disorder, depression, and psychosis  Past Medical History:  Past Medical History:  Diagnosis  Date   Anxiety    Dislocated knee 2008   R knee   Miscarriage     Past Surgical History:  Procedure Laterality Date   NO PAST SURGERIES      Family Psychiatric History: Denies  Family History:  Family History  Problem Relation Age of Onset   Diabetes Mother    Thyroid disease Mother     Social History:  Social History   Socioeconomic History   Marital status: Significant Other    Spouse name: Not on file   Number of children: 1   Years of education: 12   Highest education level: 10th grade  Occupational History   Occupation: Home health aide  Tobacco Use   Smoking status: Never   Smokeless tobacco: Never  Vaping Use   Vaping Use: Never used  Substance and Sexual Activity   Alcohol use: No   Drug use: Yes    Types: Marijuana    Comment: recently stopped   Sexual activity: Yes    Birth control/protection: None  Other Topics Concern   Not on file  Social History Narrative   Not on file   Social Determinants of Health   Financial Resource Strain: Not on file  Food Insecurity: Not on file  Transportation Needs: Not on file  Physical Activity: Not on file  Stress: Not on file  Social Connections: Not on file    Allergies:  Allergies  Allergen Reactions   Latex Itching    Metabolic Disorder Labs: Lab  Results  Component Value Date   HGBA1C 5.8 (H) 10/03/2022   MPG 116.89 10/28/2020   MPG 108.28 04/03/2018   Lab Results  Component Value Date   PROLACTIN 195.0 (H) 11/23/2020   PROLACTIN 160.1 (H) 04/03/2018   Lab Results  Component Value Date   CHOL 146 10/03/2022   TRIG 52 10/03/2022   HDL 48 10/03/2022   CHOLHDL 3.0 10/03/2022   VLDL 12 10/28/2020   LDLCALC 87 10/03/2022   LDLCALC 72 10/28/2020   Lab Results  Component Value Date   TSH 0.935 10/28/2020   TSH 1.228 03/31/2018    Therapeutic Level Labs: No results found for: "LITHIUM" No results found for: "VALPROATE" No results found for: "CBMZ"  Current Medications: No current  outpatient medications on file.   No current facility-administered medications for this visit.     Musculoskeletal: Strength & Muscle Tone: within normal limits and telehealth visit Gait & Station: normal, telehealth visit Patient leans: N/A  Psychiatric Specialty Exam: Review of Systems  There were no vitals taken for this visit.There is no height or weight on file to calculate BMI.  General Appearance: Well Groomed  Eye Contact:  Good  Speech:  Clear and Coherent and Normal Rate  Volume:  Normal  Mood:  Euthymic  Affect:  Appropriate and Congruent  Thought Process:  Coherent, Goal Directed, and Linear  Orientation:  Full (Time, Place, and Person)  Thought Content: WDL and Logical   Suicidal Thoughts:  No  Homicidal Thoughts:  No  Memory:  Immediate;   Good Recent;   Good Remote;   Good  Judgement:  Good  Insight:  Good  Psychomotor Activity:  Normal  Concentration:  Concentration: Good and Attention Span: Good  Recall:  Good  Fund of Knowledge: Good  Language: Good  Akathisia:  No  Handed:  Right  AIMS (if indicated): not done  Assets:  Communication Skills Desire for Improvement Financial Resources/Insurance Housing Leisure Time Physical Health Social Support Vocational/Educational  ADL's:  Intact  Cognition: WNL  Sleep:  Good   Screenings: AIMS    Flowsheet [facility]) [facility]  AIMS Total Score 0 0      AUDIT    Flowsheet Row [facility]) [facility]  Alcohol Use Disorder Identification Test Final Score (AUDIT) 0 0      GAD-7    Flowsheet Row Video Visit from 01/11/2023 in [facility] Video Visit from 10/19/2022 in  [facility] Office Visit from 10/03/2022 in [facility] Video Visit from 07/21/2022 in [facility] Video Visit from 04/28/2022 in [facility]  Total GAD-7 Score 0 0 0 6 14      PHQ2-9    Flowsheet Row Video Visit from 01/11/2023 in [facility] Video Visit from 10/19/2022 in [facility] Office Visit from 10/03/2022 in [facility] Video Visit from 07/21/2022 in [facility] Video Visit from 04/28/2022 in [facility]  PHQ-2 Total Score 0 0 0 2 2  PHQ-9 Total Score 0 0 -- 8 13      Flowsheet Row Video Visit from 04/28/2022 in [facility] ED  from 09/01/2021 in [facility] ED from 06/08/2021 in [facility] at [facility] RISK CATEGORY Error: Question 6 not populated No Risk No Risk        Assessment and Plan: Patient reports that she is doing well and medicated.  At this time she reports that she is not in need of psychiatric services.  She will follow-up as needed   1. Depression, major, in remission [facility])     Follow-up as needed    Shanna Cisco, NP 01/11/2023, 1:14 PM

## 2023-03-05 ENCOUNTER — Ambulatory Visit (HOSPITAL_COMMUNITY)
Admission: EM | Admit: 2023-03-05 | Discharge: 2023-03-05 | Disposition: A | Discharge status: Home / Self Care | Payer: Medicaid Other | Attending: Emergency Medicine | Admitting: Emergency Medicine

## 2023-03-05 ENCOUNTER — Encounter (HOSPITAL_COMMUNITY): Payer: Self-pay

## 2023-03-05 DIAGNOSIS — X503XXA Overexertion from repetitive movements, initial encounter: Secondary | ICD-10-CM | POA: Diagnosis not present

## 2023-03-05 DIAGNOSIS — M25531 Pain in right wrist: Secondary | ICD-10-CM | POA: Diagnosis not present

## 2023-03-05 MED ORDER — IBUPROFEN 800 MG PO TABS: 800.0000 mg | ORAL_TABLET | Freq: Three times a day (TID) | ORAL | 0 refills | Status: DC

## 2023-03-05 NOTE — Discharge Instructions (Addendum)
Rest - try to avoid heavy lifting and high impact activity Ice - apply for 20 minutes a few times daily Compression - use wrist brace as needed Elevation - prop up on a pillow  Alternate ibuprofen and tylenol every 6 hours as needed for pain and inflammation  Please follow up with orthopedics. They have walk-in hours or you can call to make an appointment.

## 2023-03-05 NOTE — ED Triage Notes (Signed)
Pt is here for right wrist pain x 2 days. Pt reports some swelling. Denies any recent trauma to her wrist.

## 2023-03-05 NOTE — ED Provider Notes (Signed)
[facility]    CSN: [id number] Arrival date & time: 03/05/23  1012     History   Chief Complaint Chief Complaint  Patient presents with   Wrist Pain    HPI Erin Ponce is a 27 y.o. female.  Several week history of right wrist pain, worsened over the last few days. Pain occurs with flexion and extension. Denies injury or trauma. Right hand dominant and does a lot of lifting and working with her hands. No numbness/tingling No prior injury Has not attempted intervention   Past Medical History:  Diagnosis Date   Anxiety    Dislocated knee 2008   R knee   Miscarriage     Patient Active Problem List   Diagnosis Date Noted   Major depressive disorder, recurrent episode (HCC) 11/16/2020   MDD (major depressive disorder), recurrent, severe, with psychosis (HCC) 03/31/2018   Severe episode of recurrent major depressive disorder, with psychotic features (HCC)    Psychosis (HCC) 03/30/2018   Brief psychotic disorder (HCC) 03/30/2018    Past Surgical History:  Procedure Laterality Date   NO PAST SURGERIES      OB History     Gravida  1   Para      Term      Preterm      AB      Living         SAB      IAB      Ectopic      Multiple      Live Births               Home Medications    Prior to Admission medications   Medication Sig Start Date End Date Taking? Authorizing Provider  ibuprofen (ADVIL) 800 MG tablet Take 1 tablet (800 mg total) by mouth 3 (three) times daily. 03/05/23  Yes Goodwin Kamphaus, Lurena Joiner, PA-C  Cetirizine HCl 10 MG CAPS Take 1 capsule (10 mg total) by mouth daily. 07/07/19 04/08/20  Wieters, Junius Creamer, PA-C    Family History Family History  Problem Relation Age of Onset   Diabetes Mother    Thyroid disease Mother     Social History Social History   Tobacco Use   Smoking status: Never   Smokeless tobacco: Never  Vaping Use   Vaping Use: Never used  Substance Use Topics   Alcohol use: No   Drug use: Yes     Types: Marijuana    Comment: recently stopped     Allergies   Latex   Review of Systems Review of Systems Per HPI  Physical Exam Triage Vital Signs ED Triage Vitals [03/05/23 1115]  Enc Vitals Group     BP 107/74     Pulse Rate 88     Resp 18     Temp 98.6 F (37 C)     Temp Source Oral     SpO2 98 %     Weight      Height      Head Circumference      Peak Flow      Pain Score      Pain Loc      Pain Edu?      Excl. in GC?    No data found.  Updated Vital Signs BP 107/74 (BP Location: Left Arm)   Pulse 88   Temp 98.6 F (37 C) (Oral)   Resp 18   LMP 03/05/2023   SpO2 98%    Physical Exam  Vitals and nursing note reviewed.  Constitutional:      General: She is not in acute distress. HENT:     Mouth/Throat:     Pharynx: Oropharynx is clear.  Cardiovascular:     Rate and Rhythm: Normal rate and regular rhythm.     Pulses: Normal pulses.  Pulmonary:     Effort: Pulmonary effort is normal.  Musculoskeletal:     Right elbow: Normal.     Right wrist: No swelling, deformity or bony tenderness. Normal range of motion. Normal pulse.     Right hand: Normal.     Cervical back: Normal range of motion.     Comments: Full ROM although with pain. No bony tenderness. Strength 5/5. Distal sensation intact, cap refill < 2 seconds, strong radial pulse. No obvious swelling or deformity.   Skin:    Capillary Refill: Capillary refill takes less than 2 seconds.  Neurological:     Mental Status: She is alert and oriented to person, place, and time.     UC Treatments / Results  Labs (all labs ordered are listed, but only abnormal results are displayed) Labs Reviewed - No data to display  EKG  Radiology No results found.  Procedures Procedures (including critical care time)  Medications Ordered in UC Medications - No data to display  Initial Impression / Assessment and Plan / UC Course  I have reviewed the triage vital signs and the nursing  notes.  Pertinent labs & imaging results that were available during my care of the patient were reviewed by me and considered in my medical decision making (see chart for details).  Suspect overuse injury No indication for xray imaging at this time Wrist brace applied. Discussed RICE therapy, alternating with ibu and tylenol for pain. Advised close follow up with orthopedics if symptoms persisting. Patient agreeable to plan. No questions at this time  Final Clinical Impressions(s) / UC Diagnoses   Final diagnoses:  Right wrist pain  Overuse injury     Discharge Instructions      Rest - try to avoid heavy lifting and high impact activity Ice - apply for 20 minutes a few times daily Compression - use wrist brace as needed Elevation - prop up on a pillow  Alternate ibuprofen and tylenol every 6 hours as needed for pain and inflammation  Please follow up with orthopedics. They have walk-in hours or you can call to make an appointment.     ED Prescriptions     Medication Sig Dispense Auth. Provider   ibuprofen (ADVIL) 800 MG tablet Take 1 tablet (800 mg total) by mouth 3 (three) times daily. 21 tablet Dyson Sevey, Lurena Joiner, PA-C      PDMP not reviewed this encounter.   Deborha Moseley, Lurena Joiner, [place] 03/05/23 1211

## 2023-04-08 IMAGING — CR DG HAND COMPLETE 3+V*R*
3 series · 3 of 3 positions shown · non-contrast
Comparison: None.

CLINICAL DATA: Pt reported she cut her right little finger and her
palm on glass of a window, states she had a previous stitch in her
pinky and it might have been removed.

EXAM:
RIGHT HAND - COMPLETE 3+ VIEW

[x hand pa right]
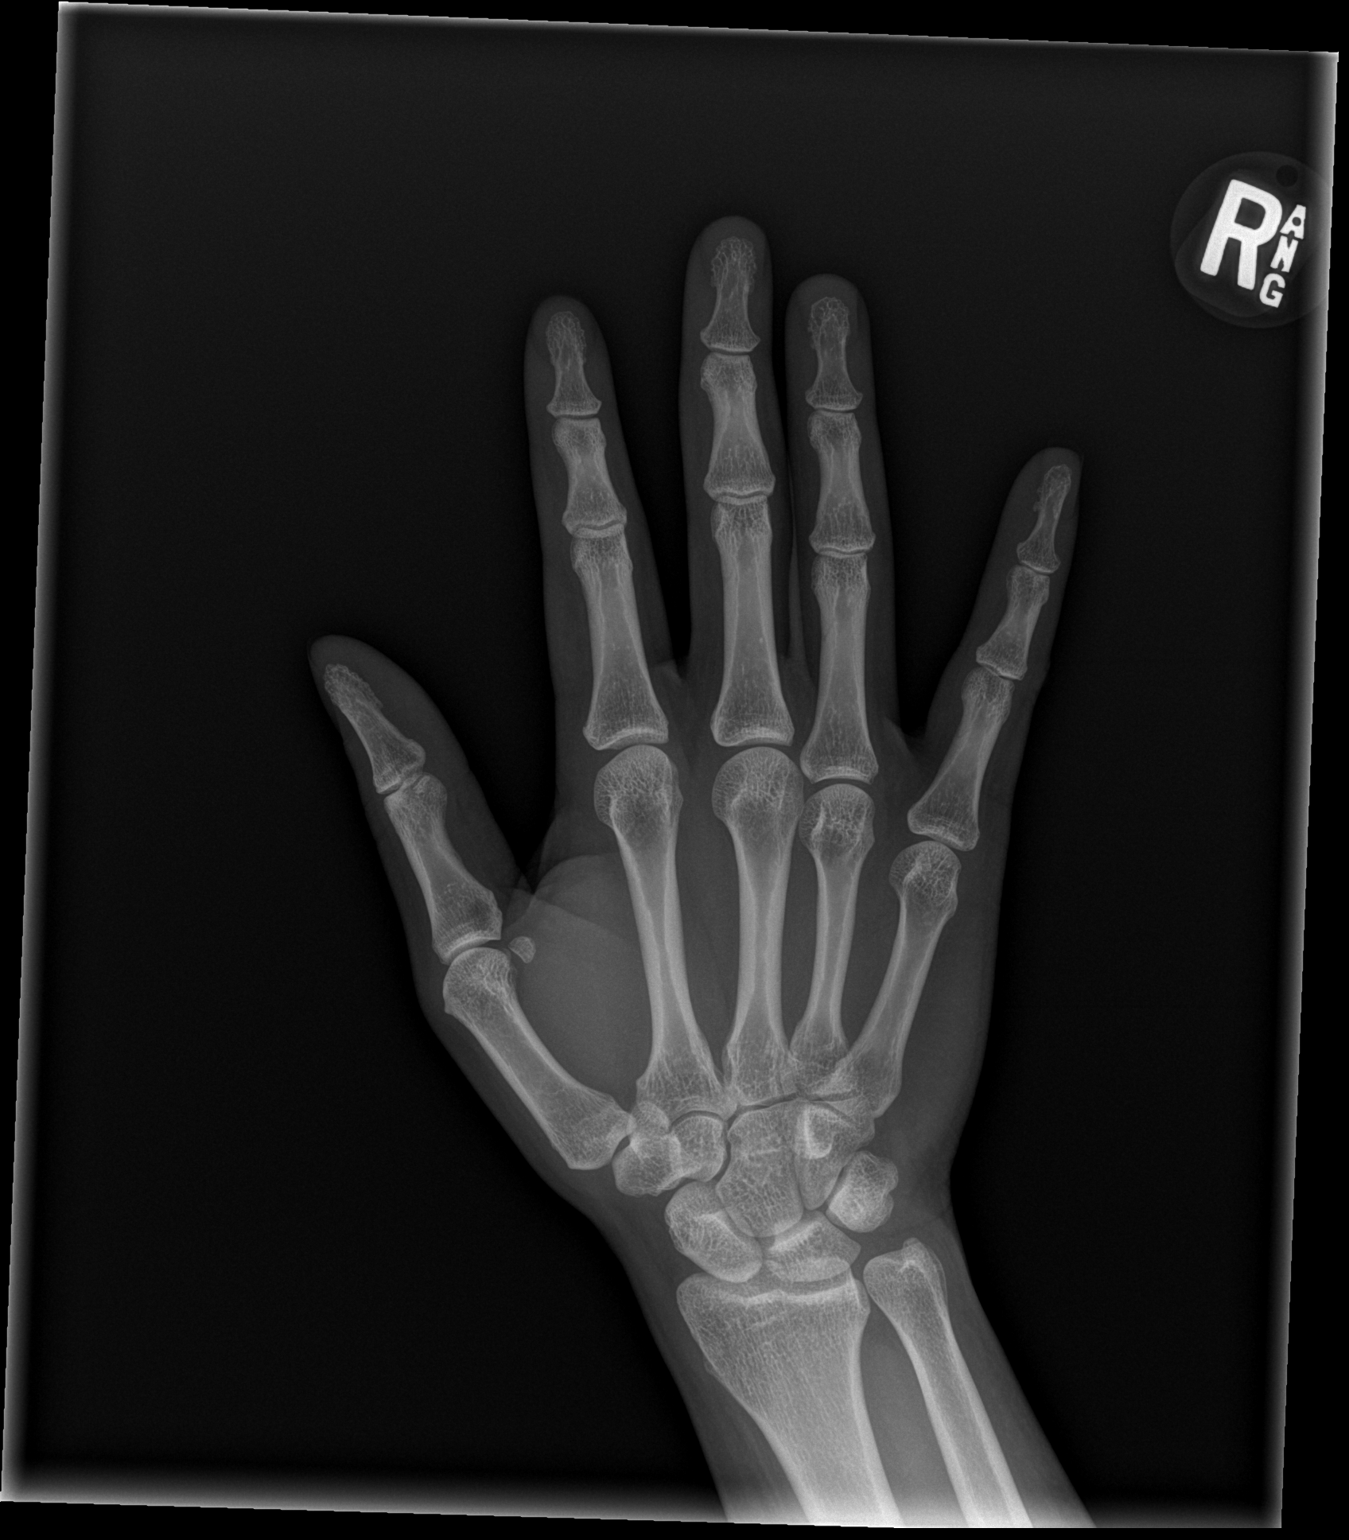

[x hand obl right]
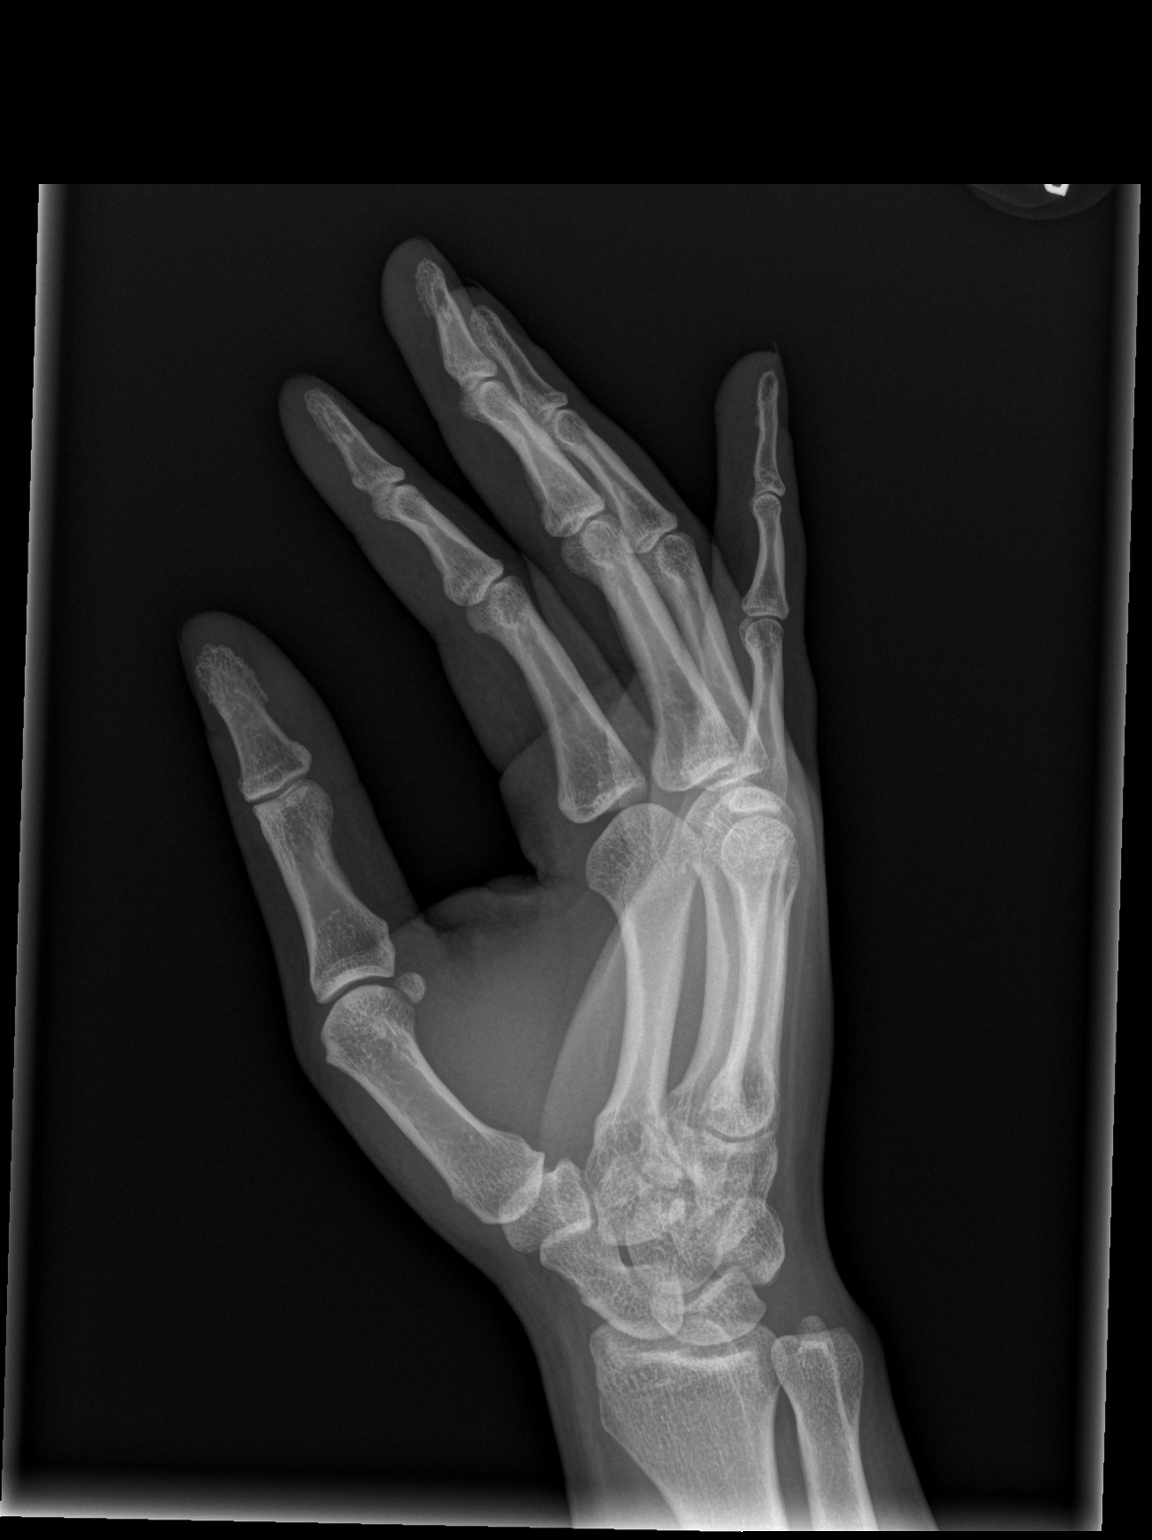

[x hand lat right]
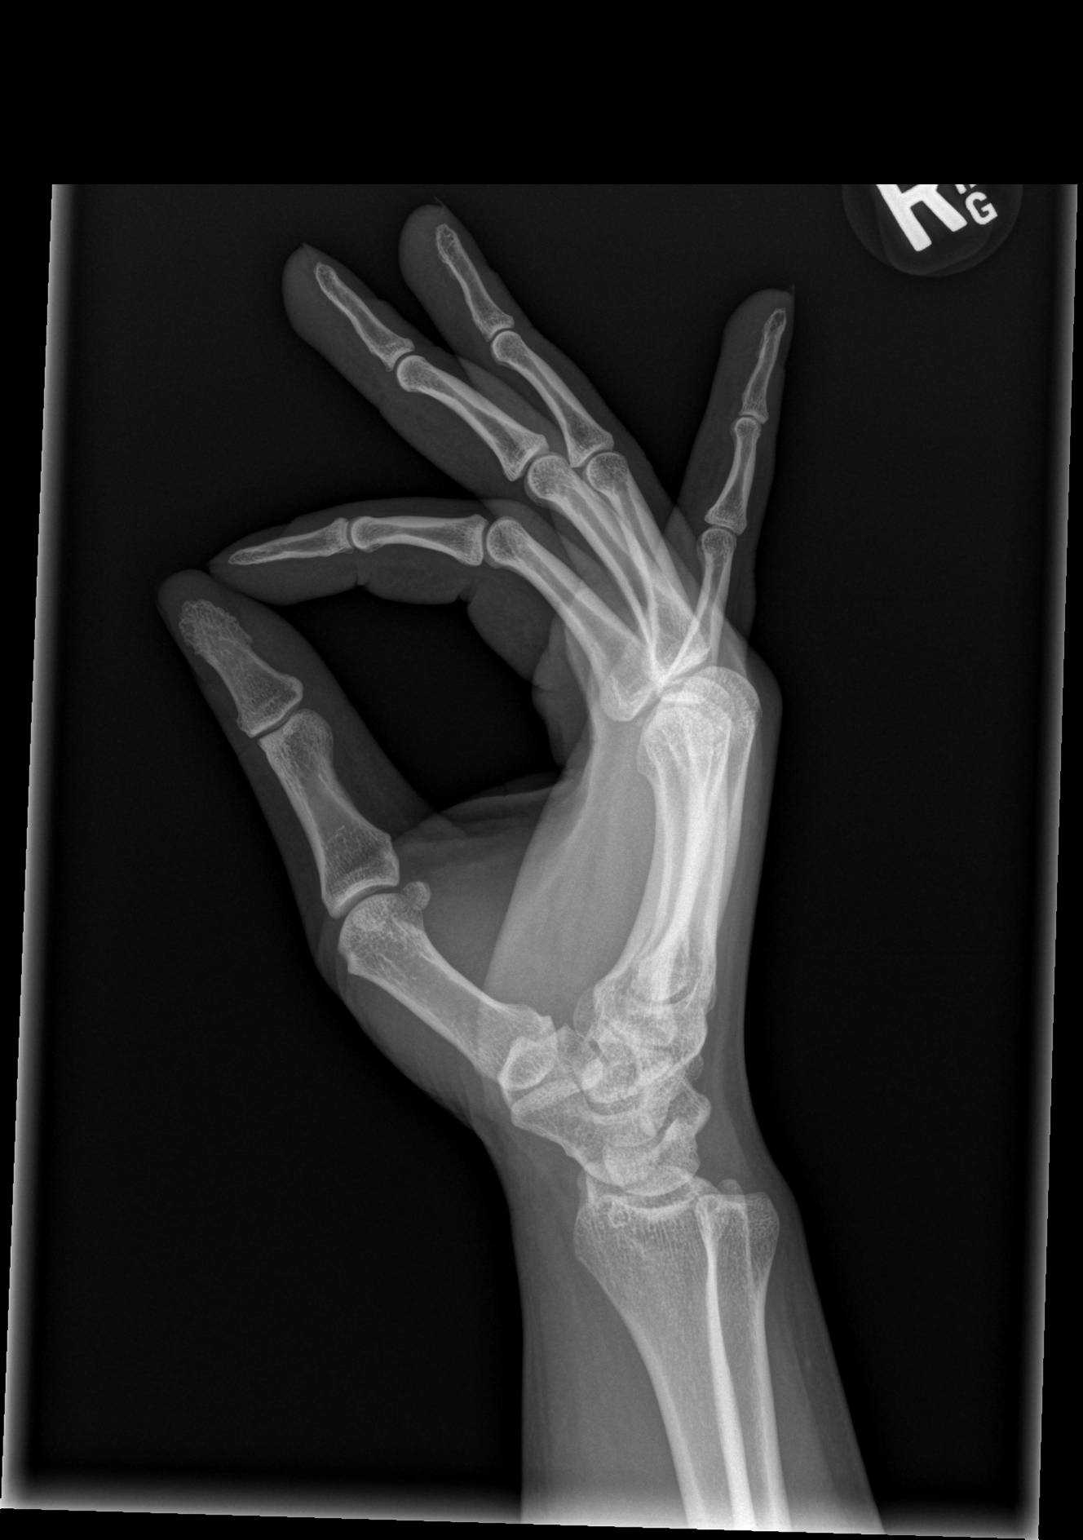

[3 of 3 positions shown; findings below may reference images not displayed]

FINDINGS: No fracture.  No bone lesion.

Joints normally spaced and aligned.

Soft tissues are unremarkable.  No radiopaque foreign body.
IMPRESSION: Negative.

## 2023-05-22 ENCOUNTER — Ambulatory Visit (INDEPENDENT_AMBULATORY_CARE_PROVIDER_SITE_OTHER): Payer: Medicaid Other | Admitting: Primary Care

## 2023-05-23 ENCOUNTER — Other Ambulatory Visit: Payer: Self-pay

## 2023-05-23 ENCOUNTER — Encounter (INDEPENDENT_AMBULATORY_CARE_PROVIDER_SITE_OTHER): Payer: Self-pay | Admitting: Primary Care

## 2023-05-23 ENCOUNTER — Ambulatory Visit (INDEPENDENT_AMBULATORY_CARE_PROVIDER_SITE_OTHER): Payer: MEDICAID | Admitting: Primary Care

## 2023-05-23 VITALS — BP 105/73 | HR 91 | Resp 16 | Ht 62.0 in | Wt 123.8 lb

## 2023-05-23 DIAGNOSIS — R0981 Nasal congestion: Secondary | ICD-10-CM

## 2023-05-23 DIAGNOSIS — S43004A Unspecified dislocation of right shoulder joint, initial encounter: Secondary | ICD-10-CM

## 2023-05-23 MED ORDER — SOD FLUORIDE-POTASSIUM NITRATE 1.1-5 % DT GEL
DENTAL | 0 refills | Status: DC
  Filled 2023-05-23: qty 100, 30d supply, fill #0

## 2023-05-23 MED ORDER — LORATADINE 10 MG PO TABS
10.0000 mg | ORAL_TABLET | Freq: Every day | ORAL | 3 refills | Status: DC
Start: 2023-05-23 — End: 2024-01-23
  Filled 2023-05-23: qty 30, 30d supply, fill #0

## 2023-05-23 MED ORDER — FLUTICASONE PROPIONATE 50 MCG/ACT NA SUSP
2.0000 | Freq: Every day | NASAL | 6 refills | Status: DC
Start: 2023-05-23 — End: 2024-01-23
  Filled 2023-05-23: qty 16, 30d supply, fill #0

## 2023-05-23 NOTE — Progress Notes (Signed)
[facility]  Erin Ponce, is a 27 y.o. female  FAO:[id number]  ONG:[id number]  DOB - 1996-07-19  Chief Complaint  Patient presents with   Shoulder Pain   Nasal Congestion       Subjective:   Erin Ponce is a 27 y.o. female here today for an acute visit.  She has been experiencing shoulder pain for approximately 2 weeks where she is able to pop her shoulder out of place and pop it back while a great amount of pain.  Patient demonstrated.  She is also complaining of nasal congestion.  Patient has No headache, No chest pain, No abdominal pain - No Nausea, No new weakness tingling or numbness, No Cough - shortness of breath  No problems updated.  Allergies  Allergen Reactions   Latex Itching    Past Medical History:  Diagnosis Date   Anxiety    Dislocated knee 2008   R knee   Miscarriage     Current Outpatient Medications on File Prior to Visit  Medication Sig Dispense Refill   ibuprofen (ADVIL) 800 MG tablet Take 1 tablet (800 mg total) by mouth 3 (three) times daily. 21 tablet 0   [DISCONTINUED] Cetirizine HCl 10 MG CAPS Take 1 capsule (10 mg total) by mouth daily. 20 capsule 0   No current facility-administered medications on file prior to visit.    Objective:   Vitals:   05/23/23 1125  BP: 105/73  Pulse: 91  Resp: 16  SpO2: 100%  Weight: 123 lb 12.8 oz (56.2 kg)  Height: 5\' 2"  (1.575 m)    Comprehensive ROS Pertinent positive and negative noted in HPI   Exam General appearance : Awake, alert, not in any distress. Speech Clear. Not toxic looking HEENT: Atraumatic and Normocephalic, pupils equally reactive to light and accomodation Neck: Supple, no JVD. No cervical lymphadenopathy.  Chest: Good air entry bilaterally, no added sounds  CVS: S1 S2 regular, no murmurs.  Abdomen: Bowel sounds present, Non tender and not distended with no gaurding, rigidity or rebound. Extremities: B/L Lower Ext shows no edema, both legs are warm to  touch Neurology: Awake alert, and oriented X 3, CN II-XII intact, Non focal Skin: No Rash  Data Review Lab Results  Component Value Date   HGBA1C 5.8 (H) 10/03/2022   HGBA1C 5.7 (H) 10/28/2020   HGBA1C 5.4 04/03/2018    Assessment & Plan   [name] was seen today for shoulder pain and nasal congestion.  Diagnoses and all orders for this visit:  Dislocated shoulder, right, initial encounter -     Ambulatory referral to Orthopedic Surgery  Chronic nasal congestion -     fluticasone (FLONASE) 50 MCG/ACT nasal spray; Place 2 sprays into both nostrils daily. -     loratadine (CLARITIN) 10 MG tablet; Take 1 tablet (10 mg total) by mouth daily.     Patient have been counseled extensively about nutrition and exercise. Other issues discussed during this visit include: low cholesterol diet, weight control and daily exercise, foot care, annual eye examinations at Ophthalmology, importance of adherence with medications and regular follow-up. We also discussed long term complications of uncontrolled diabetes and hypertension.   No follow-ups on file.  The patient was given clear instructions to go to ER or return to medical center if symptoms don't improve, worsen or new problems develop. The patient verbalized understanding. The patient was told to call to get lab results if they haven't heard anything in the next week.   This note has  been created with Education officer, environmental. Any transcriptional errors are unintentional.   Grayce Sessions, NP 05/27/2023, 9:07 PM 2 weeks

## 2023-05-24 ENCOUNTER — Other Ambulatory Visit: Payer: Self-pay

## 2023-10-19 ENCOUNTER — Telehealth (INDEPENDENT_AMBULATORY_CARE_PROVIDER_SITE_OTHER): Payer: Self-pay | Admitting: Primary Care

## 2023-10-19 NOTE — Telephone Encounter (Signed)
Called to remind pt about atp. Pt will be present.

## 2023-10-25 ENCOUNTER — Telehealth (INDEPENDENT_AMBULATORY_CARE_PROVIDER_SITE_OTHER): Payer: Self-pay | Admitting: Psychiatry

## 2023-10-25 ENCOUNTER — Encounter (HOSPITAL_COMMUNITY): Payer: Self-pay | Admitting: Psychiatry

## 2023-10-25 ENCOUNTER — Other Ambulatory Visit: Payer: Self-pay

## 2023-10-25 DIAGNOSIS — F411 Generalized anxiety disorder: Secondary | ICD-10-CM

## 2023-10-25 DIAGNOSIS — F250 Schizoaffective disorder, bipolar type: Secondary | ICD-10-CM

## 2023-10-25 MED ORDER — HYDROXYZINE HCL 10 MG PO TABS
10.0000 mg | ORAL_TABLET | Freq: Three times a day (TID) | ORAL | 0 refills | Status: DC | PRN
Start: 2023-10-25 — End: 2024-07-29
  Filled 2023-10-25: qty 90, 30d supply, fill #0

## 2023-10-25 MED ORDER — OLANZAPINE 5 MG PO TABS
5.0000 mg | ORAL_TABLET | Freq: Every day | ORAL | 2 refills | Status: DC
Start: 2023-10-25 — End: 2023-12-07
  Filled 2023-10-25: qty 30, 30d supply, fill #0

## 2023-10-25 MED ORDER — TRAZODONE HCL 50 MG PO TABS
50.0000 mg | ORAL_TABLET | Freq: Every evening | ORAL | 3 refills | Status: DC | PRN
Start: 2023-10-25 — End: 2023-12-07
  Filled 2023-10-25: qty 30, 30d supply, fill #0

## 2023-10-25 NOTE — Progress Notes (Signed)
 BH MD/PA/NP OP Progress Note Virtual Visit via Video Note  I connected with Erin Ponce on 10/25/23 at  9:00 AM EST by a video enabled telemedicine application and verified that I am speaking with the correct person using two identifiers.  Location: Patient: Home Provider: Clinic   I discussed the limitations of evaluation and management by telemedicine and the availability of in person appointments. The patient expressed understanding and agreed to proceed.  I provided 30 minutes of non-face-to-face time during this encounter.       10/25/2023 9:46 AM Erin Ponce  MRN:  [id number]  Chief Complaint: "I have been overextending myself and is not sleeping"  HPI: 28 year old female seen today for follow up psychiatric evaluation.   She has a psychiatric history of brief psychotic disorder, depression, and psychosis.  She is currently not managed on medications and reports she feels mentally stable.    Today was well-groomed, pleasant, cooperative, engaged in conversation.  She informed Clinical research associate that she is overwhelmed and not sleeping. Patient notes that she has relationship stressors. She also notes that her car has been down for for two months. Patient notes that work at [employer] is going okay. Financially patient notes that she struggles at times. She also notes that her appetite has poor. She denies weight loss or gain.   Patient notes that her mood fluctuates, she is irritable, distracted, has racing thoughts, and AH. She notes that she hears people talking about her. She notes that she copes by meditating, prayer, and journaling.  Patient reports that she feels paranoid.  She notes that she believes that her mother avoids her by staying busy.  Patient reports the above exacerbates her anxiety and depression provider conducted a GAD-7 and patient scored a 16, at her last visit she scored a 0.  Provider also conducted PHQ-9 the patient scored a 27, at her last visit she  scored an 0.  She endorses poor sleep and appetite.  Today she endorses passive SI but denies wanting to harm herself. She denies SI/HI/VH.   Today she is agreeable to start Zyprexa 5 mg to help manage mood and symptoms of psychosis. She is also agreeable to start hydroxyzine 10 mg three times daily as needed to help manage anxiety. She will start Trazodone 50 mg to help manage sleep. Potential side effects of medication and risks vs benefits of treatment vs non-treatment were explained and discussed. All questions were answered.   No other concerns at this time.    Visit Diagnosis:    ICD-10-CM   1. Schizoaffective disorder, bipolar type (HCC)  F25.0 OLANZapine (ZYPREXA) 5 MG tablet    2. Generalized anxiety disorder  F41.1 hydrOXYzine (ATARAX) 10 MG tablet    traZODone (DESYREL) 50 MG tablet       Past Psychiatric History: brief psychotic disorder, depression, and psychosis  Past Medical History:  Past Medical History:  Diagnosis Date   Anxiety    Dislocated knee 2008   R knee   Miscarriage     Past Surgical History:  Procedure Laterality Date   NO PAST SURGERIES      Family Psychiatric History: Denies  Family History:  Family History  Problem Relation Age of Onset   Diabetes Mother    Thyroid disease Mother     Social History:  Social History   Socioeconomic History   Marital status: Significant Other    Spouse name: Not on file   Number of children: 1   Years of education:  12   Highest education level: 10th grade  Occupational History   Occupation: Home health aide  Tobacco Use   Smoking status: Never   Smokeless tobacco: Never  Vaping Use   Vaping status: Never Used  Substance and Sexual Activity   Alcohol use: No   Drug use: Yes    Types: Marijuana    Comment: recently stopped   Sexual activity: Yes    Birth control/protection: None  Other Topics Concern   Not on file  Social History Narrative   Not on file   Social Drivers of Health    Financial Resource Strain: Low Risk  (05/23/2023)   Overall Financial Resource Strain (CARDIA)    Difficulty of Paying Living Expenses: Not hard at all  Food Insecurity: No Food Insecurity (05/23/2023)   Hunger Vital Sign    Worried About Running Out of Food in the Last Year: Never true    Ran Out of Food in the Last Year: Never true  Transportation Needs: No Transportation Needs (05/23/2023)   PRAPARE - Administrator, Civil Service (Medical): No    Lack of Transportation (Non-Medical): No  Physical Activity: Inactive (05/23/2023)   Exercise Vital Sign    Days of Exercise per Week: 0 days    Minutes of Exercise per Session: 0 min  Stress: No Stress Concern Present (05/23/2023)   [employer] of Occupational Health - Occupational Stress Questionnaire    Feeling of Stress : Not at all  Social Connections: Unknown (05/23/2023)   Social Connection and Isolation Panel [NHANES]    Frequency of Communication with Friends and Family: More than three times a week    Frequency of Social Gatherings with Friends and Family: More than three times a week    Attends Religious Services: Never    Database administrator or Organizations: No    Attends Banker Meetings: Never    Marital Status: Patient unable to answer    Allergies:  Allergies  Allergen Reactions   Latex Itching    Metabolic Disorder Labs: Lab Results  Component Value Date   HGBA1C 5.8 (H) 10/03/2022   MPG 116.89 10/28/2020   MPG 108.28 04/03/2018   Lab Results  Component Value Date   PROLACTIN 195.0 (H) 11/23/2020   PROLACTIN 160.1 (H) 04/03/2018   Lab Results  Component Value Date   CHOL 146 10/03/2022   TRIG 52 10/03/2022   HDL 48 10/03/2022   CHOLHDL 3.0 10/03/2022   VLDL 12 10/28/2020   LDLCALC 87 10/03/2022   LDLCALC 72 10/28/2020   Lab Results  Component Value Date   TSH 0.935 10/28/2020   TSH 1.228 03/31/2018    Therapeutic Level Labs: No results found for: "LITHIUM" No  results found for: "VALPROATE" No results found for: "CBMZ"  Current Medications: Current Outpatient Medications  Medication Sig Dispense Refill   hydrOXYzine (ATARAX) 10 MG tablet Take 1 tablet (10 mg total) by mouth 3 (three) times daily as needed. 90 tablet 0   OLANZapine (ZYPREXA) 5 MG tablet Take 1 tablet (5 mg total) by mouth at bedtime. 30 tablet 2   traZODone (DESYREL) 50 MG tablet Take 1 tablet (50 mg total) by mouth at bedtime as needed for sleep. 30 tablet 3   fluticasone (FLONASE) 50 MCG/ACT nasal spray Place 2 sprays into both nostrils daily. 16 g 6   ibuprofen (ADVIL) 800 MG tablet Take 1 tablet (800 mg total) by mouth 3 (three) times daily. 21 tablet 0  loratadine (CLARITIN) 10 MG tablet Take 1 tablet (10 mg total) by mouth daily. 90 tablet 3   Sod Fluoride-Potassium Nitrate (PREVIDENT 5000 ENAMEL PROTECT) 1.1-5 % GEL Use as directed twice daily. 100 mL 0   No current facility-administered medications for this visit.     Musculoskeletal: Strength & Muscle Tone: within normal limits and telehealth visit Gait & Station: normal, telehealth visit Patient leans: N/A  Psychiatric Specialty Exam: Review of Systems  There were no vitals taken for this visit.There is no height or weight on file to calculate BMI.  General Appearance: Well Groomed  Eye Contact:  Good  Speech:  Clear and Coherent and Normal Rate  Volume:  Normal  Mood:  Anxious and Depressed  Affect:  Appropriate and Congruent  Thought Process:  Coherent, Goal Directed, and Linear  Orientation:  Full (Time, Place, and Person)  Thought Content: Logical, Hallucinations: Auditory, and Paranoid Ideation   Suicidal Thoughts:  Yes.  without intent/plan  Homicidal Thoughts:  No  Memory:  Immediate;   Good Recent;   Good Remote;   Good  Judgement:  Good  Insight:  Good  Psychomotor Activity:  Normal  Concentration:  Concentration: Good and Attention Span: Good  Recall:  Good  Fund of Knowledge: Good   Language: Good  Akathisia:  No  Handed:  Right  AIMS (if indicated): not done  Assets:  Communication Skills Desire for Improvement Financial Resources/Insurance Housing Leisure Time Physical Health Social Support Vocational/Educational  ADL's:  Intact  Cognition: WNL  Sleep:  Poor   Screenings: AIMS    Flowsheet Row [facility]) [facility]  AIMS Total Score 0 0      AUDIT    Flowsheet Row [facility]) [facility]  Alcohol Use Disorder Identification Test Final Score (AUDIT) 0 0      GAD-7    Flowsheet Row Video Visit from 10/25/2023 in [facility] Video Visit from 01/11/2023 in [facility] Video Visit from 10/19/2022 in [facility] Office Visit from 10/03/2022 in [facility] Video Visit from 07/21/2022 in [facility]  Total GAD-7 Score 16 0 0 0 6      PHQ2-9    Flowsheet Row Video Visit from 10/25/2023 in [facility] Office Visit from 05/23/2023 in [facility] Video Visit from 01/11/2023 in [facility] Video Visit from 10/19/2022 in [facility] Office Visit from 10/03/2022 in [facility]  PHQ-2 Total Score 6 0 0 0 0  PHQ-9 Total Score 27 0 0 0 --      Flowsheet Row Video Visit from 10/25/2023 in [facility] ED from 03/05/2023 in [facility] Video Visit from 04/28/2022 in [facility]  C-SSRS RISK CATEGORY Error: Q7 should  not be populated when Q6 is No No Risk Error: Question 6 not populated        Assessment and Plan: Patient endorses increased anxiety, depression, poor sleep, AH, and paranoia. Today she is agreeable to start Zyprexa 5 mg to help manage mood and symptoms of psychosis. She is also agreeable to start hydroxyzine 10 mg three  times daily as needed to help manage anxiety. She will start Trazodone 50 mg to help manage sleep. Potential side effects of medication and risks vs benefits of treatment vs non-treatment were explained and discussed. All questions were answered.    1. Schizoaffective disorder, bipolar type (HCC) (Primary)  Start- OLANZapine (ZYPREXA) 5 MG tablet; Take 1 tablet (5 mg total) by mouth at bedtime.  Dispense: 30 tablet; Refill: 2  2. Generalized anxiety disorder  Start- hydrOXYzine (ATARAX) 10 MG tablet; Take 1 tablet (10 mg total) by mouth 3 (three) times daily as needed.  Dispense: 90 tablet; Refill: 0 Start- traZODone (DESYREL) 50 MG tablet; Take 1 tablet (50 mg total) by mouth at bedtime as needed for sleep.  Dispense: 30 tablet; Refill: 3      Follow-up as needed    Shanna Cisco, NP 10/25/2023, 9:46 AM

## 2023-10-26 ENCOUNTER — Encounter (INDEPENDENT_AMBULATORY_CARE_PROVIDER_SITE_OTHER): Payer: MEDICAID | Admitting: Primary Care

## 2023-10-27 ENCOUNTER — Ambulatory Visit (INDEPENDENT_AMBULATORY_CARE_PROVIDER_SITE_OTHER): Payer: Self-pay | Admitting: Primary Care

## 2023-10-27 ENCOUNTER — Encounter (INDEPENDENT_AMBULATORY_CARE_PROVIDER_SITE_OTHER): Payer: Self-pay | Admitting: Primary Care

## 2023-10-27 VITALS — BP 108/72 | HR 88 | Resp 16 | Ht 62.0 in | Wt 125.0 lb

## 2023-10-27 DIAGNOSIS — M25511 Pain in right shoulder: Secondary | ICD-10-CM

## 2023-10-27 DIAGNOSIS — G8929 Other chronic pain: Secondary | ICD-10-CM

## 2023-10-27 NOTE — Patient Instructions (Signed)
 Cold Sore    A cold sore, also called a fever blister, is a small, fluid-filled sore that forms inside of the mouth or on the lips, gums, nose, chin, or cheeks. Cold sores can spread to other parts of the body, such as the eyes, fingers, or genitals.  Cold sores can spread from person to person (are contagious) until the sores crust over completely. Most cold sores go away within 2 weeks.  What are the causes?  Cold sores are caused by a virus (herpes simplex virus type 1, HSV-1). The virus can spread from person to person through close contact, such as through:  Kissing.  Touching the affected area.  Sharing personal items such as lip balm, razors, a drinking glass, or eating utensils.  What increases the risk?  Being tired, stressed, or sick.  Having your period (menstruating).  Being pregnant.  Taking certain medicines.  Being out in cold weather or getting too much sun.  What are the signs or symptoms?  Symptoms of a cold sore go through different stages:  Tingling, itching, or burning is felt 1-2 days before the cold sore appears.  Fluid-filled blisters appear on the lips, inside the mouth, on the nose, or on the cheeks.  The blisters start to ooze clear fluid.  The blisters dry up, and a yellow crust appears in their place.  The crust falls off.  In some cases, other symptoms can develop along with cold sores. These can include:  Fever.  Sore throat.  Headache.  Muscle aches.  Swollen neck glands.  How is this treated?  There is no cure for cold sores or the virus that causes them. There is also no vaccine to prevent the virus. Most cold sores go away on their own without treatment within 2 weeks. Your doctor may prescribe medicines to:  Help with pain.  Keep the virus from growing.  Help you heal faster.  Medicines may be in the form of creams, gels, pills, or a shot.  Follow these instructions at home:  Medicines  Take or apply over-the-counter and prescription medicines only as told by your doctor.  Use a  cotton-tip swab to apply creams or gels to your sores.  Ask your doctor if you can take lysine supplements. These may help with healing.  Sore care    Do not touch the sores or pick the scabs.  Wash your hands often with soap and water for at least 20 seconds. Do not touch your eyes without washing your hands first.  Keep the sores clean and dry.  If told, put ice on the sores. To do this:  Put ice in a plastic bag.  Place a towel between your skin and the bag.  Leave the ice on for 20 minutes, 2-3 times a day.  Take off the ice if your skin turns bright red. This is very important. If you cannot feel pain, heat, or cold, you have a greater risk of damage to the area.  Eating and drinking  Eat a soft, bland diet. Avoid eating hot, cold, or salty foods. These can hurt your mouth.  Use a straw if it hurts to drink out of a glass.  Eat foods that have a lot of lysine in them. These include meat, fish, and dairy products.  Avoid sugary foods, chocolates, nuts, and grains. These foods have a high amount of a substance (arginine) that can cause the virus to grow.  Lifestyle  Do not kiss, have oral sex,  or share personal items until your sores heal.  Stress, poor sleep, and being out in the sun can trigger a cold sore. Make sure you:  Do activities that help you relax, such as deep breathing exercises or meditation.  Get enough sleep.  Put sunscreen on your lips before you go out in the sun.  Contact a doctor if:  You have symptoms for more than 2 weeks.  You have pus coming from the sores.  You have redness that is spreading.  You have pain or irritation in your eye.  You get sores on your genitals.  Your sores do not heal within 2 weeks.  You get cold sores often.  Get help right away if:  You have a fever and your symptoms suddenly get worse.  You have a headache and confusion.  You have tiredness (fatigue).  You do not want to eat as much as normal (loss of appetite).  You have a stiff neck or are sensitive to  light.  Summary  A cold sore is a small, fluid-filled sore that forms inside of the mouth or on the lips, gums, nose, chin, or cheeks.  Cold sores can spread from person to person (are contagious) until the sores crust over completely. Most cold sores go away within 2 weeks.  Wash your hands often. Do not touch your eyes without washing your hands first.  Do not kiss, have oral sex, or share personal items until your sores heal.  Contact a doctor if your sores do not heal within 2 weeks.  This information is not intended to replace advice given to you by your health care provider. Make sure you discuss any questions you have with your health care provider.  Document Revised: 06/02/2021 Document Reviewed: 06/02/2021  Elsevier Patient Education  2024 [employer].

## 2023-10-27 NOTE — Progress Notes (Signed)
 [facility]  Erin Ponce, is a 28 y.o. female  QMV:[id number]  MWU:[id number]  DOB - 1996-05-17  Chief Complaint  Patient presents with   Shoulder Injury       Subjective:   Erin Ponce is a 28 y.o. female here today for an acute visit. Hx of trauma to right shoulder Patient was questioning why she was not seen by ortho because her right shoulder has been painful , increased with reaching, grabbing , combing hair or trying to hold something over 10 lbs. reviewed chart with patient and she was referred to [facility] on 05/23/23 the office had tried to reach out to her on several occasions and was unsuccessful in close the referral.  Asked patient to call office now and schedule an appointment.   No problems updated.  Comprehensive ROS Pertinent positive and negative noted in HPI   Allergies  Allergen Reactions   Latex Itching    Past Medical History:  Diagnosis Date   Anxiety    Dislocated knee 2008   R knee   Miscarriage     Current Outpatient Medications on File Prior to Visit  Medication Sig Dispense Refill   fluticasone (FLONASE) 50 MCG/ACT nasal spray Place 2 sprays into both nostrils daily. 16 g 6   hydrOXYzine (ATARAX) 10 MG tablet Take 1 tablet (10 mg total) by mouth 3 (three) times daily as needed. 90 tablet 0   ibuprofen (ADVIL) 800 MG tablet Take 1 tablet (800 mg total) by mouth 3 (three) times daily. 21 tablet 0   loratadine (CLARITIN) 10 MG tablet Take 1 tablet (10 mg total) by mouth daily. 90 tablet 3   OLANZapine (ZYPREXA) 5 MG tablet Take 1 tablet (5 mg total) by mouth at bedtime. 30 tablet 2   Sod Fluoride-Potassium Nitrate (PREVIDENT 5000 ENAMEL PROTECT) 1.1-5 % GEL Use as directed twice daily. 100 mL 0   traZODone (DESYREL) 50 MG tablet Take 1 tablet (50 mg total) by mouth at bedtime as needed for sleep. 30 tablet 3   [DISCONTINUED] Cetirizine HCl 10 MG CAPS Take 1 capsule (10 mg total) by mouth daily. 20 capsule 0   No current  facility-administered medications on file prior to visit.   Health Maintenance  Topic Date Due   COVID-19 Vaccine (2 - Pfizer risk series) 11/12/2020   HPV Vaccine (2 - 3-dose series) 10/31/2022   Flu Shot  04/06/2023   Pap Smear  01/22/2024   DTaP/Tdap/Td vaccine (2 - Td or Tdap) 08/23/2029   Hepatitis C Screening  Completed   HIV Screening  Completed    Objective:   Vitals:   10/27/23 1107  BP: 108/72  Pulse: 88  Resp: 16  SpO2: 97%  Weight: 125 lb (56.7 kg)  Height: 5\' 2"  (1.575 m)   Physical Exam Vitals reviewed.  Constitutional:      Appearance: Normal appearance.  HENT:     Head: Normocephalic.     Right Ear: Tympanic membrane, ear canal and external ear normal.     Left Ear: Tympanic membrane, ear canal and external ear normal.     Nose: Nose normal.     Mouth/Throat:     Mouth: Mucous membranes are moist.  Eyes:     Extraocular Movements: Extraocular movements intact.     Pupils: Pupils are equal, round, and reactive to light.  Cardiovascular:     Rate and Rhythm: Normal rate.  Pulmonary:     Effort: Pulmonary effort is normal.     Breath  sounds: Normal breath sounds.  Abdominal:     General: Bowel sounds are normal.     Palpations: Abdomen is soft.  Musculoskeletal:        General: Tenderness and signs of injury present.     Cervical back: Normal range of motion.     Comments: Decreased range of motion right arm able to raise 45 degrees until pain starts radiating from cervical area to arm   Skin:    General: Skin is warm and dry.  Neurological:     Mental Status: She is alert and oriented to person, place, and time.  Psychiatric:        Mood and Affect: Mood normal.        Behavior: Behavior normal.        Thought Content: Thought content normal.     Assessment & Plan  Sanja was seen today for shoulder injury.  Diagnoses and all orders for this visit:  Chronic right shoulder pain Referral was made had patient to call the office while she  was here.  Appointment made for [facility] on November 03, 2023 at 930.   Patient have been counseled extensively about nutrition and exercise. Other issues discussed during this visit include: low cholesterol diet, weight control and daily exercise, foot care, annual eye examinations at Ophthalmology, importance of adherence with medications and regular follow-up. We also discussed long term complications of uncontrolled diabetes and hypertension.   Return in about 3 months (around 01/24/2024) for fasting labs.  The patient was given clear instructions to go to ER or return to medical center if symptoms don't improve, worsen or new problems develop. The patient verbalized understanding. The patient was told to call to get lab results if they haven't heard anything in the next week.   This note has been created with Education officer, environmental. Any transcriptional errors are unintentional.   Grayce Sessions, NP 10/27/2023, 11:26 AM

## 2023-11-02 ENCOUNTER — Telehealth (INDEPENDENT_AMBULATORY_CARE_PROVIDER_SITE_OTHER): Payer: Self-pay | Admitting: Primary Care

## 2023-11-02 NOTE — Telephone Encounter (Signed)
 Called pt to see if we needed to help answer any questions for her. Pt stated that she wanted Erin Ponce to follow up with her about some othro questions. Please advise

## 2023-11-03 ENCOUNTER — Encounter (INDEPENDENT_AMBULATORY_CARE_PROVIDER_SITE_OTHER): Payer: Self-pay | Admitting: Primary Care

## 2023-11-15 ENCOUNTER — Ambulatory Visit (INDEPENDENT_AMBULATORY_CARE_PROVIDER_SITE_OTHER): Payer: Self-pay | Admitting: Primary Care

## 2023-11-15 NOTE — Telephone Encounter (Signed)
 Duplicate. Please see nurse triage from today, patient called back and was warm transferred to nurse triage. Closing encounter.

## 2023-11-15 NOTE — Telephone Encounter (Signed)
 Copied from CRM [id number]. Topic: Appointments - Appointment Scheduling >> Nov 15, 2023  2:22 PM DeAngela L wrote: Patient/patient representative is calling to schedule an appointment could not find 2 day appt. Refer to attachments for appointment information. There are no appointments available in the next 2 days for Patient to come in for an appt, She is having wrist concerns with her right wrist and it's hard to pick up objects.  11/15/23- Call dropped during agent transfer. RN called both numbers listed on chart, no answer. Unable to leave vm. Placed in callbacks

## 2023-11-15 NOTE — Telephone Encounter (Signed)
 2nd attempt. No answer, message left to call back for triage.

## 2023-11-15 NOTE — Telephone Encounter (Signed)
 Voicemail left for patient to call back to office to speak to Nurse Triage. Will place in call backs.   Copied from CRM [id number]. Topic: Appointments - Appointment Scheduling >> Nov 15, 2023  2:22 PM Erin Ponce wrote: Patient/patient representative is calling to schedule an appointment could not find 2 day appt. Refer to attachments for appointment information. There are no appointments available in the next 2 days for Patient to come in for an appt, She is having wrist concerns with her right wrist and it's hard to pick up objects.

## 2023-11-15 NOTE — Telephone Encounter (Signed)
  Chief Complaint: right wrist pain Symptoms: neck pain, right wrist pain Frequency: x 2 days Pertinent Negatives: Patient denies numbness/tingling, swelling, fever, rash. Disposition: [] ED /[] Urgent Care (no appt availability in office) / [x] Appointment(In office/virtual)/ []  [facility]/ [] Home Care/ [] Refused Recommended Disposition /[] [facility]/ []  Follow-up with PCP Additional Notes: Patient calling in for right wrist pain, denies any known injury. She states she also has neck pain. Patient denies taking any OTC medications for pain, advised home care to take ibuprofen or Tylenol for eht pain. Patient agreeable to acute visit with Dr Jonny Ruiz tomorrow at [facility]. Patient verbalizes understanding to call back for any new or worsening symptoms.  Copied from CRM [id number]. Topic: Appointments - Appointment Scheduling >> Nov 15, 2023  2:22 PM Erin Ponce wrote: Patient/patient representative is calling to schedule an appointment could not find 2 day appt. Refer to attachments for appointment information. There are no appointments available in the next 2 days for Patient to come in for an appt, She is having wrist concerns with her right wrist and it's hard to pick up objects. Reason for Disposition  [1] MODERATE pain (e.g., interferes with normal activities) AND [2] present > 3 days  Answer Assessment - Initial Assessment Questions 1. ONSET: "When did the pain start?"     2 days ago.  2. LOCATION: "Where is the pain located?"     Right wrist.  3. PAIN: "How bad is the pain?" (Scale 1-10; or mild, moderate, severe)   - MILD (1-3): Doesn't interfere with normal activities.   - MODERATE (4-7): Interferes with normal activities (e.g., work or school) or awakens from sleep.   - SEVERE (8-10): Excruciating pain, unable to do any normal activities, unable to hold a cup of water.     9/10, patient denies taking any medication for pain today.  4. WORK OR EXERCISE: "Has  there been any recent work or exercise that involved this part of the body?"     Denies any injury but states it may be from over use, difficulty picking up items and bending the wrist.  5. CAUSE: "What do you think is causing the arm pain?"     She states it could be from holding her phone too long causing her wrist to be sore.  6. OTHER SYMPTOMS: "Do you have any other symptoms?" (e.g., neck pain, swelling, rash, fever, numbness, weakness)     Unable to bend pinky on right hand, recent GI illness on Sunday and Monday with vomiting and resolved yesterday, neck pain.  7. PREGNANCY: "Is there any chance you are pregnant?" "When was your last menstrual period?"     LMP: 11/03/23.  Protocols used: Arm Pain-A-AH

## 2023-11-16 ENCOUNTER — Ambulatory Visit: Admitting: Internal Medicine

## 2023-11-16 NOTE — Telephone Encounter (Signed)
 Noted.

## 2023-12-07 ENCOUNTER — Other Ambulatory Visit (INDEPENDENT_AMBULATORY_CARE_PROVIDER_SITE_OTHER): Payer: Self-pay | Admitting: Primary Care

## 2023-12-07 DIAGNOSIS — F250 Schizoaffective disorder, bipolar type: Secondary | ICD-10-CM

## 2023-12-07 DIAGNOSIS — F411 Generalized anxiety disorder: Secondary | ICD-10-CM

## 2023-12-07 NOTE — Telephone Encounter (Signed)
 Copied from CRM [id number]. Topic: Clinical - Medication Refill >> Dec 07, 2023  8:40 AM Fuller Canada P wrote: Most Recent Primary Care Visit:  Provider: Grayce Sessions  Department: [facility]  Visit Type: ACUTE  Date: 10/27/2023  Medication: traZODone (DESYREL) 50 MG tablet OLANZapine (ZYPREXA) 5 MG tablet   Has the patient contacted their pharmacy? Yes (Agent: If no, request that the patient contact the pharmacy for the refill. If patient does not wish to contact the pharmacy document the reason why and proceed with request.) (Agent: If yes, when and what did the pharmacy advise?)  Is this the correct pharmacy for this prescription? Yes If no, delete pharmacy and type the correct one.  This is the patient's preferred pharmacy:  [facility] [address], [address] [place] [address] Phone: [phone] Fax: [phone]  [id number] - [place], [place] - [address] [place] [address] Phone: [phone] Fax: [phone]   Has the prescription been filled recently? Yes, 10/25/2023  Is the patient out of the medication? Yes, has no pills left   Has the patient been seen for an appointment in the last year OR does the patient have an upcoming appointment? Yes  Can we respond through MyChart? Yes  Agent: Please be advised that Rx refills may take up to 3 business days. We ask that you follow-up with your pharmacy.

## 2023-12-08 NOTE — Telephone Encounter (Signed)
 Requested medication (s) are due for refill today: no  Requested medication (s) are on the active medication list: yes  Last refill:  10/25/23 #30/2  Future visit scheduled: no  Notes to clinic:  not delegated and [facility] prescriber     Requested Prescriptions  Pending Prescriptions Disp Refills   OLANZapine (ZYPREXA) 5 MG tablet 30 tablet 2    Sig: Take 1 tablet (5 mg total) by mouth at bedtime.     Not Delegated - Psychiatry:  Antipsychotics - Second Generation (Atypical) - olanzapine Failed - 12/08/2023 11:01 AM      Failed - This refill cannot be delegated      Failed - TSH in normal range and within 360 days    TSH  Date Value Ref Range Status  10/28/2020 0.935 0.350 - 4.500 uIU/mL Final    Comment:    Performed by a 3rd Generation assay with a functional sensitivity of <=0.01 uIU/mL. Performed at [facility], [address] [place], [place] [address]          Failed - Valid encounter within last 6 months    Recent Outpatient Visits           1 month ago Chronic right shoulder pain   [place] [facility] Grayce Sessions, NP   6 months ago Dislocated shoulder, right, initial encounter   [place] [facility] Grayce Sessions, NP   1 year ago Need for immunization against influenza   [place] [facility] Grayce Sessions, NP   2 years ago Breast tenderness in female   [place] [facility] Grayce Sessions, NP   2 years ago Abnormal uterine bleeding (AUB)   [place] [facility] Grayce Sessions, NP              Failed - Lipid Panel in normal range within the last 12 months    Cholesterol, Total  Date Value Ref Range Status  10/03/2022 146 100 - 199 mg/dL Final   LDL Chol Calc (NIH)  Date Value Ref Range Status  10/03/2022 87 0 - 99 mg/dL Final   HDL  Date Value Ref Range Status  10/03/2022 48 >39 mg/dL Final   Triglycerides   Date Value Ref Range Status  10/03/2022 52 0 - 149 mg/dL Final         Failed - CBC within normal limits and completed in the last 12 months    WBC  Date Value Ref Range Status  10/03/2022 5.1 3.4 - 10.8 x10E3/uL Final  10/24/2020 5.6 4.0 - 10.5 K/uL Final   RBC  Date Value Ref Range Status  10/03/2022 4.53 3.77 - 5.28 x10E6/uL Final  10/24/2020 4.70 3.87 - 5.11 MIL/uL Final   Hemoglobin  Date Value Ref Range Status  10/03/2022 12.6 11.1 - 15.9 g/dL Final   Hematocrit  Date Value Ref Range Status  10/03/2022 38.7 34.0 - 46.6 % Final   MCHC  Date Value Ref Range Status  10/03/2022 32.6 31.5 - 35.7 g/dL Final  46/96/2952 84.1 30.0 - 36.0 g/dL Final   [facility]  Date Value Ref Range Status  10/03/2022 27.8 26.6 - 33.0 pg Final  10/24/2020 27.4 26.0 - 34.0 pg Final   MCV  Date Value Ref Range Status  10/03/2022 85 79 - 97 fL Final   No results found for: "PLTCOUNTKUC", "LABPLAT", "POCPLA" RDW  Date Value Ref Range Status  10/03/2022 13.6 11.7 - 15.4 %  Final         Failed - CMP within normal limits and completed in the last 12 months    Albumin  Date Value Ref Range Status  10/03/2022 4.8 4.0 - 5.0 g/dL Final   Alkaline Phosphatase  Date Value Ref Range Status  10/03/2022 58 44 - 121 IU/L Final   ALT  Date Value Ref Range Status  10/03/2022 6 0 - 32 IU/L Final   AST  Date Value Ref Range Status  10/03/2022 12 0 - 40 IU/L Final   BUN  Date Value Ref Range Status  10/03/2022 9 6 - 20 mg/dL Final   Calcium  Date Value Ref Range Status  10/03/2022 9.6 8.7 - 10.2 mg/dL Final   Calcium, Ion  Date Value Ref Range Status  12/04/2017 1.12 (L) 1.15 - 1.40 mmol/L Final   CO2  Date Value Ref Range Status  10/03/2022 19 (L) 20 - 29 mmol/L Final   TCO2  Date Value Ref Range Status  12/04/2017 19 (L) 22 - 32 mmol/L Final   Creatinine, Ser  Date Value Ref Range Status  10/03/2022 0.71 0.57 - 1.00 mg/dL Final   Glucose  Date Value Ref Range Status   10/03/2022 91 70 - 99 mg/dL Final   Glucose, Bld  Date Value Ref Range Status  10/24/2020 92 70 - 99 mg/dL Final    Comment:    Glucose reference range applies only to samples taken after fasting for at least 8 hours.   Potassium  Date Value Ref Range Status  10/03/2022 4.5 3.5 - 5.2 mmol/L Final   Sodium  Date Value Ref Range Status  10/03/2022 142 134 - 144 mmol/L Final   Bilirubin Total  Date Value Ref Range Status  10/03/2022 0.4 0.0 - 1.2 mg/dL Final   Protein, ur  Date Value Ref Range Status  06/08/2021 NEGATIVE NEGATIVE mg/dL Final   Total Protein  Date Value Ref Range Status  10/03/2022 7.0 6.0 - 8.5 g/dL Final   GFR calc Af Amer  Date Value Ref Range Status  04/29/2019 >60 >60 mL/min Final   eGFR  Date Value Ref Range Status  10/03/2022 120 >59 mL/min/1.73 Final   GFR, Estimated  Date Value Ref Range Status  10/24/2020 >60 >60 mL/min Final    Comment:    (NOTE) Calculated using the CKD-EPI Creatinine Equation (2021)          Passed - Completed PHQ-2 or PHQ-9 in the last 360 days      Passed - Last BP in normal range    BP Readings from Last 1 Encounters:  10/27/23 108/72         Passed - Last Heart Rate in normal range    Pulse Readings from Last 1 Encounters:  10/27/23 88          traZODone (DESYREL) 50 MG tablet 30 tablet 3    Sig: Take 1 tablet (50 mg total) by mouth at bedtime as needed for sleep.     Psychiatry: Antidepressants - Serotonin Modulator Failed - 12/08/2023 11:01 AM      Failed - Valid encounter within last 6 months    Recent Outpatient Visits           1 month ago Chronic right shoulder pain   [place] [facility] Grayce Sessions, NP   6 months ago Dislocated shoulder, right, initial encounter   [place] [facility] Grayce Sessions, NP   1 year  ago Need for immunization against influenza   [place] [facility] Grayce Sessions, NP   2 years ago  Breast tenderness in female   [place] [facility] Grayce Sessions, NP   2 years ago Abnormal uterine bleeding (AUB)   [place] [facility] Grayce Sessions, NP              Passed - Completed PHQ-2 or PHQ-9 in the last 360 days

## 2023-12-08 NOTE — Telephone Encounter (Signed)
 Requested medication (s) are due for refill today: -both meds ordered by Toy Cookey at [facility]  Requested medication (s) are on the active medication list: yes  Last refill:  10/25/23  Future visit scheduled: no  Notes to clinic:  Zyprexa not delegated to NT to refill or refuse   Requested Prescriptions  Pending Prescriptions Disp Refills   OLANZapine (ZYPREXA) 5 MG tablet 30 tablet 2    Sig: Take 1 tablet (5 mg total) by mouth at bedtime.     Not Delegated - Psychiatry:  Antipsychotics - Second Generation (Atypical) - olanzapine Failed - 12/08/2023 11:01 AM      Failed - This refill cannot be delegated      Failed - TSH in normal range and within 360 days    TSH  Date Value Ref Range Status  10/28/2020 0.935 0.350 - 4.500 uIU/mL Final    Comment:    Performed by a 3rd Generation assay with a functional sensitivity of <=0.01 uIU/mL. Performed at [facility], [address], [place], [place] [address]          Failed - Valid encounter within last 6 months    Recent Outpatient Visits           1 month ago Chronic right shoulder pain   [place] [facility] Grayce Sessions, NP   6 months ago Dislocated shoulder, right, initial encounter   [place] [facility] Grayce Sessions, NP   1 year ago Need for immunization against influenza   [place] [facility] Grayce Sessions, NP   2 years ago Breast tenderness in female   [place] [facility] Grayce Sessions, NP   2 years ago Abnormal uterine bleeding (AUB)   [place] [facility] Grayce Sessions, NP              Failed - Lipid Panel in normal range within the last 12 months    Cholesterol, Total  Date Value Ref Range Status  10/03/2022 146 100 - 199 mg/dL Final   LDL Chol Calc (NIH)  Date Value Ref Range Status  10/03/2022 87 0 - 99 mg/dL Final   HDL  Date Value Ref Range  Status  10/03/2022 48 >39 mg/dL Final   Triglycerides  Date Value Ref Range Status  10/03/2022 52 0 - 149 mg/dL Final         Failed - CBC within normal limits and completed in the last 12 months    WBC  Date Value Ref Range Status  10/03/2022 5.1 3.4 - 10.8 x10E3/uL Final  10/24/2020 5.6 4.0 - 10.5 K/uL Final   RBC  Date Value Ref Range Status  10/03/2022 4.53 3.77 - 5.28 x10E6/uL Final  10/24/2020 4.70 3.87 - 5.11 MIL/uL Final   Hemoglobin  Date Value Ref Range Status  10/03/2022 12.6 11.1 - 15.9 g/dL Final   Hematocrit  Date Value Ref Range Status  10/03/2022 38.7 34.0 - 46.6 % Final   MCHC  Date Value Ref Range Status  10/03/2022 32.6 31.5 - 35.7 g/dL Final  52/84/1324 40.1 30.0 - 36.0 g/dL Final   [facility]  Date Value Ref Range Status  10/03/2022 27.8 26.6 - 33.0 pg Final  10/24/2020 27.4 26.0 - 34.0 pg Final   MCV  Date Value Ref Range Status  10/03/2022 85 79 - 97 fL Final   No results found for: "PLTCOUNTKUC", "LABPLAT", "POCPLA" RDW  Date Value  Ref Range Status  10/03/2022 13.6 11.7 - 15.4 % Final         Failed - CMP within normal limits and completed in the last 12 months    Albumin  Date Value Ref Range Status  10/03/2022 4.8 4.0 - 5.0 g/dL Final   Alkaline Phosphatase  Date Value Ref Range Status  10/03/2022 58 44 - 121 IU/L Final   ALT  Date Value Ref Range Status  10/03/2022 6 0 - 32 IU/L Final   AST  Date Value Ref Range Status  10/03/2022 12 0 - 40 IU/L Final   BUN  Date Value Ref Range Status  10/03/2022 9 6 - 20 mg/dL Final   Calcium  Date Value Ref Range Status  10/03/2022 9.6 8.7 - 10.2 mg/dL Final   Calcium, Ion  Date Value Ref Range Status  12/04/2017 1.12 (L) 1.15 - 1.40 mmol/L Final   CO2  Date Value Ref Range Status  10/03/2022 19 (L) 20 - 29 mmol/L Final   TCO2  Date Value Ref Range Status  12/04/2017 19 (L) 22 - 32 mmol/L Final   Creatinine, Ser  Date Value Ref Range Status  10/03/2022 0.71 0.57 - 1.00  mg/dL Final   Glucose  Date Value Ref Range Status  10/03/2022 91 70 - 99 mg/dL Final   Glucose, Bld  Date Value Ref Range Status  10/24/2020 92 70 - 99 mg/dL Final    Comment:    Glucose reference range applies only to samples taken after fasting for at least 8 hours.   Potassium  Date Value Ref Range Status  10/03/2022 4.5 3.5 - 5.2 mmol/L Final   Sodium  Date Value Ref Range Status  10/03/2022 142 134 - 144 mmol/L Final   Bilirubin Total  Date Value Ref Range Status  10/03/2022 0.4 0.0 - 1.2 mg/dL Final   Protein, ur  Date Value Ref Range Status  06/08/2021 NEGATIVE NEGATIVE mg/dL Final   Total Protein  Date Value Ref Range Status  10/03/2022 7.0 6.0 - 8.5 g/dL Final   GFR calc Af Amer  Date Value Ref Range Status  04/29/2019 >60 >60 mL/min Final   eGFR  Date Value Ref Range Status  10/03/2022 120 >59 mL/min/1.73 Final   GFR, Estimated  Date Value Ref Range Status  10/24/2020 >60 >60 mL/min Final    Comment:    (NOTE) Calculated using the CKD-EPI Creatinine Equation (2021)          Passed - Completed PHQ-2 or PHQ-9 in the last 360 days      Passed - Last BP in normal range    BP Readings from Last 1 Encounters:  10/27/23 108/72         Passed - Last Heart Rate in normal range    Pulse Readings from Last 1 Encounters:  10/27/23 88          traZODone (DESYREL) 50 MG tablet 30 tablet 3    Sig: Take 1 tablet (50 mg total) by mouth at bedtime as needed for sleep.     Psychiatry: Antidepressants - Serotonin Modulator Failed - 12/08/2023 11:01 AM      Failed - Valid encounter within last 6 months    Recent Outpatient Visits           1 month ago Chronic right shoulder pain   [place] [facility] Grayce Sessions, NP   6 months ago Dislocated shoulder, right, initial encounter   [facility] Gray[facility] Sessions, NP   1 year ago Need for immunization against influenza   [place] [facility] Gray[facility] Sessions, NP   2 years ago Breast tenderness in female   [place] [facility] Grayce Sessions, NP   2 years ago Abnormal uterine bleeding (AUB)   [place] [facility] Grayce Sessions, NP              Passed - Completed PHQ-2 or PHQ-9 in the last 360 days

## 2023-12-13 ENCOUNTER — Other Ambulatory Visit: Payer: Self-pay

## 2023-12-13 MED ORDER — TRAZODONE HCL 50 MG PO TABS
50.0000 mg | ORAL_TABLET | Freq: Every evening | ORAL | 3 refills | Status: DC | PRN
  Filled 2023-12-13: qty 30, 30d supply, fill #0
  Filled 2024-02-06: qty 30, 30d supply, fill #1
  Filled 2024-07-18 (×2): qty 30, 30d supply, fill #2

## 2023-12-13 MED ORDER — OLANZAPINE 5 MG PO TABS
5.0000 mg | ORAL_TABLET | Freq: Every day | ORAL | 2 refills | Status: DC
Start: 2023-12-13 — End: 2024-07-29
  Filled 2023-12-13: qty 30, 30d supply, fill #0
  Filled 2024-02-06: qty 30, 30d supply, fill #1
  Filled 2024-07-18 (×2): qty 30, 30d supply, fill #2

## 2024-01-10 ENCOUNTER — Ambulatory Visit (INDEPENDENT_AMBULATORY_CARE_PROVIDER_SITE_OTHER): Admitting: Primary Care

## 2024-01-10 ENCOUNTER — Telehealth (HOSPITAL_COMMUNITY): Payer: Self-pay | Admitting: Psychiatry

## 2024-01-10 ENCOUNTER — Encounter (HOSPITAL_COMMUNITY): Payer: Self-pay

## 2024-01-18 ENCOUNTER — Telehealth (INDEPENDENT_AMBULATORY_CARE_PROVIDER_SITE_OTHER): Payer: Self-pay | Admitting: Primary Care

## 2024-01-18 NOTE — Telephone Encounter (Signed)
 Called pt to confirm appt. Pt will be present.

## 2024-01-22 ENCOUNTER — Telehealth (INDEPENDENT_AMBULATORY_CARE_PROVIDER_SITE_OTHER): Payer: Self-pay | Admitting: Primary Care

## 2024-01-22 NOTE — Telephone Encounter (Signed)
 Spoke to pt about upcoming appt.. Will be present

## 2024-01-23 ENCOUNTER — Ambulatory Visit (INDEPENDENT_AMBULATORY_CARE_PROVIDER_SITE_OTHER): Payer: Self-pay | Admitting: Primary Care

## 2024-01-23 ENCOUNTER — Encounter (INDEPENDENT_AMBULATORY_CARE_PROVIDER_SITE_OTHER): Payer: Self-pay | Admitting: Primary Care

## 2024-01-23 VITALS — BP 110/62 | HR 84 | Temp 98.8°F | Resp 16 | Ht 62.0 in | Wt 140.0 lb

## 2024-01-23 DIAGNOSIS — Z6825 Body mass index (BMI) 25.0-25.9, adult: Secondary | ICD-10-CM

## 2024-01-23 DIAGNOSIS — Z3202 Encounter for pregnancy test, result negative: Secondary | ICD-10-CM

## 2024-01-23 DIAGNOSIS — R7303 Prediabetes: Secondary | ICD-10-CM

## 2024-01-23 DIAGNOSIS — N921 Excessive and frequent menstruation with irregular cycle: Secondary | ICD-10-CM

## 2024-01-23 LAB — POCT URINE PREGNANCY: Preg Test, Ur: NEGATIVE

## 2024-01-23 NOTE — Progress Notes (Signed)
 [facility]  Erin Ponce, is a 28 y.o. female  ZOX:[id number]  [id number]:[id number]  DOB - 23-Aug-1996  Chief Complaint  Patient presents with   Menstrual Problem       Subjective:   Erin Ponce is a 28 y.o. female here today for an acute visit.  Patient voices concerns that since September her menstrual cycle has varied in days from 28 days to every 21 days.  She also noticed that this change irritability and moodiness she is on no birth control.  Question if she was pregnant stated no.  We discussed.  Varies from shape size of a person she is normal BMI 25.61 and cycle ranges from 21 to 28 days which is also normal.  Will do a pregnancy test just to reassure her everything related to her menstrual cycle is normal.  No problems updated.  Comprehensive ROS Pertinent positive and negative noted in HPI   Allergies  Allergen Reactions   Latex Itching    Past Medical History:  Diagnosis Date   Anxiety    Dislocated knee 2008   R knee   Miscarriage     Current Outpatient Medications on File Prior to Visit  Medication Sig Dispense Refill   hydrOXYzine  (ATARAX ) 10 MG tablet Take 1 tablet (10 mg total) by mouth 3 (three) times daily as needed. 90 tablet 0   OLANZapine  (ZYPREXA ) 5 MG tablet Take 1 tablet (5 mg total) by mouth at bedtime. 30 tablet 2   traZODone  (DESYREL ) 50 MG tablet Take 1 tablet (50 mg total) by mouth at bedtime as needed for sleep. 30 tablet 3   [DISCONTINUED] Cetirizine  HCl 10 MG CAPS Take 1 capsule (10 mg total) by mouth daily. 20 capsule 0   No current facility-administered medications on file prior to visit.   Health Maintenance  Topic Date Due   COVID-19 Vaccine (2 - Pfizer risk series) 11/12/2020   HPV Vaccine (2 - 3-dose series) 10/31/2022   Pap Smear  01/22/2024   Flu Shot  04/05/2024   DTaP/Tdap/Td vaccine (2 - Td or Tdap) 08/23/2029   Hepatitis C Screening  Completed   HIV Screening  Completed   Meningitis B Vaccine  Aged Out     Objective:   Vitals:   01/23/24 1507  BP: 110/62  Pulse: 84  Resp: 16  Temp: 98.8 F (37.1 C)  TempSrc: Oral  SpO2: 100%  Weight: 140 lb (63.5 kg)  Height: 5\' 2"  (1.575 m)   BP Readings from Last 3 Encounters:  01/23/24 110/62  10/27/23 108/72  05/23/23 105/73      Physical Exam Vitals reviewed.  Constitutional:      Appearance: Normal appearance.  HENT:     Head: Normocephalic.     Right Ear: Tympanic membrane, ear canal and external ear normal.     Left Ear: Tympanic membrane, ear canal and external ear normal.     Nose: Nose normal.     Mouth/Throat:     Mouth: Mucous membranes are moist.  Eyes:     Extraocular Movements: Extraocular movements intact.     Pupils: Pupils are equal, round, and reactive to light.  Cardiovascular:     Rate and Rhythm: Normal rate.  Pulmonary:     Effort: Pulmonary effort is normal.     Breath sounds: Normal breath sounds.  Abdominal:     General: Bowel sounds are normal.     Palpations: Abdomen is soft.  Musculoskeletal:        General:  Normal range of motion.     Cervical back: Normal range of motion.  Skin:    General: Skin is warm and dry.  Neurological:     Mental Status: She is alert and oriented to person, place, and time.  Psychiatric:        Mood and Affect: Mood normal.        Behavior: Behavior normal.        Thought Content: Thought content normal.       Assessment & Plan  Xaria was seen today for menstrual problem.  Diagnoses and all orders for this visit:  Menorrhagia with irregular cycle -     POCT urine pregnancy -     CBC with Differential  Prediabetes -     Hemoglobin A1c  BMI 25.0-25.9,adult -     CMP14+EGFR  Patient have been counseled extensively about nutrition and exercise. Other issues discussed during this visit include: low cholesterol diet, weight control and daily exercise, foot care, annual eye examinations at Ophthalmology, importance of adherence with medications and regular  follow-up. We also discussed long term complications of uncontrolled diabetes and hypertension.   Return for schedule pap.  The patient was given clear instructions to go to ER or return to medical center if symptoms don't improve, worsen or new problems develop. The patient verbalized understanding. The patient was told to call to get lab results if they haven't heard anything in the next week.   This note has been created with Education officer, environmental. Any transcriptional errors are unintentional.   Marius Siemens, NP 01/26/2024, 12:34 PM

## 2024-01-24 LAB — CBC WITH DIFFERENTIAL/PLATELET
Basophils Absolute: 0 10*3/uL (ref 0.0–0.2)
Basos: 0 %
EOS (ABSOLUTE): 0.1 10*3/uL (ref 0.0–0.4)
Eos: 1 %
Hematocrit: 39.2 % (ref 34.0–46.6)
Hemoglobin: 12.7 g/dL (ref 11.1–15.9)
Immature Grans (Abs): 0 10*3/uL (ref 0.0–0.1)
Immature Granulocytes: 0 %
Lymphocytes Absolute: 2.4 10*3/uL (ref 0.7–3.1)
Lymphs: 33 %
MCH: 28.7 pg (ref 26.6–33.0)
MCHC: 32.4 g/dL (ref 31.5–35.7)
MCV: 89 fL (ref 79–97)
Monocytes Absolute: 0.5 10*3/uL (ref 0.1–0.9)
Monocytes: 7 %
Neutrophils Absolute: 4.2 10*3/uL (ref 1.4–7.0)
Neutrophils: 59 %
Platelets: 315 10*3/uL (ref 150–450)
RBC: 4.42 x10E6/uL (ref 3.77–5.28)
RDW: 13.4 % (ref 11.7–15.4)
WBC: 7.2 10*3/uL (ref 3.4–10.8)

## 2024-01-24 LAB — CMP14+EGFR
ALT: 19 IU/L (ref 0–32)
AST: 21 IU/L (ref 0–40)
Albumin: 4.5 g/dL (ref 4.0–5.0)
Alkaline Phosphatase: 64 IU/L (ref 44–121)
BUN/Creatinine Ratio: 12 (ref 9–23)
BUN: 9 mg/dL (ref 6–20)
Bilirubin Total: 0.4 mg/dL (ref 0.0–1.2)
CO2: 20 mmol/L (ref 20–29)
Calcium: 9.2 mg/dL (ref 8.7–10.2)
Chloride: 102 mmol/L (ref 96–106)
Creatinine, Ser: 0.77 mg/dL (ref 0.57–1.00)
Globulin, Total: 2.6 g/dL (ref 1.5–4.5)
Glucose: 57 mg/dL — ABNORMAL LOW (ref 70–99)
Potassium: 4.1 mmol/L (ref 3.5–5.2)
Sodium: 137 mmol/L (ref 134–144)
Total Protein: 7.1 g/dL (ref 6.0–8.5)
eGFR: 108 mL/min/{1.73_m2} (ref >59)

## 2024-01-24 LAB — HEMOGLOBIN A1C
Est. average glucose Bld gHb Est-mCnc: 111 mg/dL
Hgb A1c MFr Bld: 5.5 % (ref 4.8–5.6)

## 2024-01-29 ENCOUNTER — Ambulatory Visit (INDEPENDENT_AMBULATORY_CARE_PROVIDER_SITE_OTHER): Payer: Self-pay | Admitting: Primary Care

## 2024-02-02 ENCOUNTER — Telehealth (INDEPENDENT_AMBULATORY_CARE_PROVIDER_SITE_OTHER): Payer: Self-pay | Admitting: Primary Care

## 2024-02-02 NOTE — Telephone Encounter (Signed)
 Spoke to pt about upcoming appt.. Will be present

## 2024-02-05 ENCOUNTER — Other Ambulatory Visit (HOSPITAL_COMMUNITY)
Admission: RE | Admit: 2024-02-05 | Discharge: 2024-02-05 | Disposition: A | Discharge status: Home / Self Care | Source: Ambulatory Visit | Attending: Primary Care | Admitting: Primary Care

## 2024-02-05 ENCOUNTER — Encounter (INDEPENDENT_AMBULATORY_CARE_PROVIDER_SITE_OTHER): Payer: Self-pay | Admitting: Primary Care

## 2024-02-05 ENCOUNTER — Ambulatory Visit (INDEPENDENT_AMBULATORY_CARE_PROVIDER_SITE_OTHER): Admitting: Primary Care

## 2024-02-05 VITALS — BP 118/75 | HR 74 | Wt 136.2 lb

## 2024-02-05 DIAGNOSIS — Z01419 Encounter for gynecological examination (general) (routine) without abnormal findings: Secondary | ICD-10-CM

## 2024-02-05 DIAGNOSIS — Z124 Encounter for screening for malignant neoplasm of cervix: Secondary | ICD-10-CM | POA: Diagnosis present

## 2024-02-05 NOTE — Progress Notes (Signed)
  [facility]  WELL-WOMAN PHYSICAL & PAP Patient name: Erin Ponce MRN [id number]  Date of birth: [birth year] Chief Complaint:   Gynecologic Exam  History of Present Illness:   Erin Ponce is a 28 y.o. G1P0 female being seen today for a routine well-woman exam.   CC:gyn  The current method of family planning is none.  Patient's last menstrual period was 01/21/2024 (approximate). Last pap 01/21/21. Results were: normal  Family h/o breast cancer: Yes maternal aunt  Family h/o colorectal cancer: Yes fraternal grandfather   Health Maintenance  Topic Date Due   COVID-19 Vaccine (2 - Pfizer risk series) 11/12/2020   HPV Vaccine (2 - 3-dose series) 10/31/2022   Flu Shot  04/05/2024   Pap Smear  02/05/2027   DTaP/Tdap/Td vaccine (2 - Td or Tdap) 08/23/2029   Hepatitis C Screening  Completed   HIV Screening  Completed   Meningitis B Vaccine  Aged Out   Review of Systems:    Denies any headaches, blurred vision, fatigue, shortness of breath, chest pain, abdominal pain, abnormal vaginal discharge/itching/odor/irritation, problems with periods, bowel movements, urination, or intercourse unless otherwise stated above.  Pertinent History Reviewed:   Reviewed past medical,surgical, social and family history.  Reviewed problem list, medications and allergies.  Physical Assessment:   Vitals:   02/05/24 1501  BP: 118/75  Pulse: 74  SpO2: 99%  Weight: 136 lb 3.2 oz (61.8 kg)  Body mass index is 24.91 kg/m.        Physical Examination:  General appearance - well appearing, and in no distress Mental status - alert, oriented to person, place, and time Psych:  She has a normal mood and affect Skin - warm and dry, normal color, no suspicious lesions noted Chest - effort normal, all lung fields clear to auscultation bilaterally Heart - normal rate and regular rhythm Neck:  midline trachea, no thyromegaly or nodules Breasts - breasts appear normal, no suspicious  masses, no skin or nipple changes or axillary nodes Educated patient on proper self breast examination and had patient to demonstrate SBE. Abdomen - soft, nontender, nondistended, no masses or organomegaly Pelvic-VULVA: normal appearing vulva with no masses, tenderness or lesions   VAGINA: normal appearing vagina with normal color and discharge, no lesions   CERVIX: normal appearing cervix without discharge or lesions, no CMT UTERUS: uterus is felt to be normal size, shape, consistency and nontender  ADNEXA: No adnexal masses or tenderness noted. Extremities:  No swelling or varicosities noted  No results found for this or any previous visit (from the past 24 hours).   Assessment & Plan:  [name] was seen today for gynecologic exam.  Diagnoses and all orders for this visit:  Cervical cancer screening -     Cytology - PAP -     Cervicovaginal ancillary only -     HIV antibody (with reflex)     Follow-up:  This note has been created with Education officer, environmental. Any transcriptional errors are unintentional.   Marius Siemens, NP 02/12/2024, 8:32 AM

## 2024-02-06 ENCOUNTER — Other Ambulatory Visit: Payer: Self-pay

## 2024-02-06 LAB — HIV ANTIBODY (ROUTINE TESTING W REFLEX): HIV Screen 4th Generation wRfx: NONREACTIVE

## 2024-02-07 LAB — CERVICOVAGINAL ANCILLARY ONLY
Bacterial Vaginitis (gardnerella): POSITIVE — AB
Candida Glabrata: NEGATIVE
Candida Vaginitis: POSITIVE — AB
Chlamydia: NEGATIVE
Comment: NEGATIVE
Comment: NEGATIVE
Comment: NEGATIVE
Comment: NEGATIVE
Comment: NEGATIVE
Comment: NORMAL
Neisseria Gonorrhea: NEGATIVE
Trichomonas: NEGATIVE

## 2024-02-08 LAB — CYTOLOGY - PAP
Chlamydia: NEGATIVE
Comment: NEGATIVE
Comment: NEGATIVE
Comment: NEGATIVE
Comment: NORMAL
Diagnosis: NEGATIVE
High risk HPV: NEGATIVE
Neisseria Gonorrhea: NEGATIVE
Trichomonas: NEGATIVE

## 2024-02-11 ENCOUNTER — Ambulatory Visit (INDEPENDENT_AMBULATORY_CARE_PROVIDER_SITE_OTHER): Payer: Self-pay | Admitting: Primary Care

## 2024-02-11 DIAGNOSIS — B9689 Other specified bacterial agents as the cause of diseases classified elsewhere: Secondary | ICD-10-CM

## 2024-02-11 DIAGNOSIS — B379 Candidiasis, unspecified: Secondary | ICD-10-CM

## 2024-02-11 MED ORDER — METRONIDAZOLE 500 MG PO TABS
500.0000 mg | ORAL_TABLET | Freq: Two times a day (BID) | ORAL | 0 refills | Status: DC
  Filled 2024-02-11: qty 14, 7d supply, fill #0

## 2024-02-11 MED ORDER — FLUCONAZOLE 150 MG PO TABS
150.0000 mg | ORAL_TABLET | Freq: Every day | ORAL | 1 refills | Status: DC
  Filled 2024-02-11: qty 1, 1d supply, fill #0
  Filled 2024-07-18 (×2): qty 1, 1d supply, fill #1

## 2024-02-12 ENCOUNTER — Other Ambulatory Visit (HOSPITAL_COMMUNITY): Payer: Self-pay

## 2024-02-12 ENCOUNTER — Other Ambulatory Visit: Payer: Self-pay

## 2024-02-12 ENCOUNTER — Encounter (INDEPENDENT_AMBULATORY_CARE_PROVIDER_SITE_OTHER): Payer: Self-pay | Admitting: Primary Care

## 2024-02-13 ENCOUNTER — Other Ambulatory Visit: Payer: Self-pay

## 2024-07-18 ENCOUNTER — Other Ambulatory Visit (INDEPENDENT_AMBULATORY_CARE_PROVIDER_SITE_OTHER): Payer: Self-pay | Admitting: Primary Care

## 2024-07-18 ENCOUNTER — Other Ambulatory Visit (HOSPITAL_COMMUNITY): Payer: Self-pay | Admitting: Psychiatry

## 2024-07-18 ENCOUNTER — Other Ambulatory Visit (HOSPITAL_COMMUNITY): Payer: Self-pay

## 2024-07-18 DIAGNOSIS — B9689 Other specified bacterial agents as the cause of diseases classified elsewhere: Secondary | ICD-10-CM

## 2024-07-18 DIAGNOSIS — F411 Generalized anxiety disorder: Secondary | ICD-10-CM

## 2024-07-22 ENCOUNTER — Other Ambulatory Visit: Payer: Self-pay

## 2024-07-22 ENCOUNTER — Ambulatory Visit (HOSPITAL_COMMUNITY)
Admission: EM | Admit: 2024-07-22 | Discharge: 2024-07-22 | Disposition: A | Discharge status: Home / Self Care | Payer: Self-pay

## 2024-07-22 ENCOUNTER — Emergency Department (HOSPITAL_COMMUNITY)
Admission: EM | Admit: 2024-07-22 | Discharge: 2024-07-23 | Disposition: A | Discharge status: Other Acute Inpatient Hospital | Payer: Self-pay

## 2024-07-22 DIAGNOSIS — Z91040 Latex allergy status: Secondary | ICD-10-CM | POA: Insufficient documentation

## 2024-07-22 DIAGNOSIS — F259 Schizoaffective disorder, unspecified: Secondary | ICD-10-CM

## 2024-07-22 DIAGNOSIS — F29 Unspecified psychosis not due to a substance or known physiological condition: Secondary | ICD-10-CM | POA: Diagnosis present

## 2024-07-22 DIAGNOSIS — R109 Unspecified abdominal pain: Secondary | ICD-10-CM

## 2024-07-22 DIAGNOSIS — F250 Schizoaffective disorder, bipolar type: Secondary | ICD-10-CM | POA: Insufficient documentation

## 2024-07-22 DIAGNOSIS — F22 Delusional disorders: Secondary | ICD-10-CM

## 2024-07-22 LAB — HCG, SERUM, QUALITATIVE: Preg, Serum: NEGATIVE

## 2024-07-22 LAB — CBC WITH DIFFERENTIAL/PLATELET
Abs Immature Granulocytes: 0.02 K/uL (ref 0.00–0.07)
Basophils Absolute: 0 K/uL (ref 0.0–0.1)
Basophils Relative: 1 %
Eosinophils Absolute: 0.1 K/uL (ref 0.0–0.5)
Eosinophils Relative: 1 %
HCT: 41.7 % (ref 36.0–46.0)
Hemoglobin: 13.2 g/dL (ref 12.0–15.0)
Immature Granulocytes: 0 %
Lymphocytes Relative: 42 %
Lymphs Abs: 3.7 K/uL (ref 0.7–4.0)
MCH: 28 pg (ref 26.0–34.0)
MCHC: 31.7 g/dL (ref 30.0–36.0)
MCV: 88.3 fL (ref 80.0–100.0)
Monocytes Absolute: 0.6 K/uL (ref 0.1–1.0)
Monocytes Relative: 7 %
Neutro Abs: 4.3 K/uL (ref 1.7–7.7)
Neutrophils Relative %: 49 %
Platelets: 285 K/uL (ref 150–400)
RBC: 4.72 MIL/uL (ref 3.87–5.11)
RDW: 14.7 % (ref 11.5–15.5)
WBC: 8.7 K/uL (ref 4.0–10.5)
nRBC: 0 % (ref 0.0–0.2)

## 2024-07-22 LAB — URINALYSIS, ROUTINE W REFLEX MICROSCOPIC
Bilirubin Urine: NEGATIVE
Glucose, UA: NEGATIVE mg/dL
Ketones, ur: 20 mg/dL — AB
Nitrite: NEGATIVE
Protein, ur: 30 mg/dL — AB
Specific Gravity, Urine: 1.031 — ABNORMAL HIGH (ref 1.005–1.030)
pH: 5 (ref 5.0–8.0)

## 2024-07-22 LAB — COMPREHENSIVE METABOLIC PANEL WITH GFR
ALT: 15 U/L (ref 0–44)
AST: 19 U/L (ref 15–41)
Albumin: 4.5 g/dL (ref 3.5–5.0)
Alkaline Phosphatase: 46 U/L (ref 38–126)
Anion gap: 11 (ref 5–15)
BUN: 11 mg/dL (ref 6–20)
CO2: 22 mmol/L (ref 22–32)
Calcium: 9.6 mg/dL (ref 8.9–10.3)
Chloride: 104 mmol/L (ref 98–111)
Creatinine, Ser: 0.85 mg/dL (ref 0.44–1.00)
GFR, Estimated: >60 mL/min (ref >60)
Glucose, Bld: 97 mg/dL (ref 70–99)
Potassium: 3.5 mmol/L (ref 3.5–5.1)
Sodium: 137 mmol/L (ref 135–145)
Total Bilirubin: 0.6 mg/dL (ref 0.0–1.2)
Total Protein: 7.7 g/dL (ref 6.5–8.1)

## 2024-07-22 LAB — RAPID URINE DRUG SCREEN, HOSP PERFORMED
Amphetamines: NOT DETECTED
Barbiturates: NOT DETECTED
Benzodiazepines: NOT DETECTED
Cocaine: NOT DETECTED
Opiates: NOT DETECTED
Tetrahydrocannabinol: POSITIVE — AB

## 2024-07-22 NOTE — ED Notes (Signed)
 Pt brought to exam room to be  triaged.  When this RN asked pt about her abd pain pt started talking with flight of ideas.   St's she doesn't know the time, date or day.  Pt st's she doesn't know what happened between Oct. And Nov.  C/O working too much and being tired.     [place]  Dreama, FNP in room at this time talking with pt

## 2024-07-22 NOTE — ED Notes (Signed)
 Pt refusing to change into scrubs. Pt states she just want to die. Pt pulls out graham crackers and peanut butter from scrub shirt pocket, pt states she need to give the crackers and peanut butter to her daughter in the morning.  KM

## 2024-07-22 NOTE — ED Triage Notes (Signed)
 Patient in ED with concerns of abdominal pain that she is unsure of how long she's had it. Denies nausea, vomiting and diarrhea. She states it feels like a hernia and she does often heavy lifting.

## 2024-07-22 NOTE — ED Triage Notes (Signed)
 Pt seen at [facility] prior to triage here for abdominal pain that she does not know when it started. Pt says that she has been disoriented for some time now. 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 AOx4. Denies SI/HI/AVH

## 2024-07-22 NOTE — ED Notes (Signed)
 Pt sat up in bed and said that it felt like a kick. Pt stated that she is not sure if it eating itself or not.

## 2024-07-22 NOTE — ED Notes (Signed)
 Walked with pt back to triage 3.  Pt walked in room and stopped, looked around and asked me Have I been here before?  Pt then states she had a miscarriage then tears began to form in her eyes. Pt appeared to be confused as to where she was and why she was here. I asked the pt to have a seat and I would have the nurse to come talk to her.  KM

## 2024-07-22 NOTE — ED Notes (Signed)
Pt sent to ED via private vehicle.

## 2024-07-22 NOTE — ED Provider Notes (Signed)
 Patient presents to clinic over concerns of abdominal pain and disorientation.  Reports the clocks in her home have been changed and she is unsure how long she has been working.  Unsure how long she has had abdominal pain.  Denies fevers, nausea or vomiting.  Does seem very paranoid, feels like the father of her child has been following her.  She drove herself into clinic.  Offered for security to escort her down to the emergency department for further evaluation and mental health evaluation.  Patient declined.  She will transport herself to [facility] emergency department via POV.  While she does appear to be suffering from mental illness /acute psychosis she is not having thoughts of self-harm and appears stable.   Erin Ponce, Erin Ponce  N, FNP 07/22/24 [phone]

## 2024-07-22 NOTE — ED Provider Triage Note (Addendum)
 Emergency Medicine Provider Triage Evaluation Note  Erin Ponce , a 28 y.o. female  was evaluated in triage.  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  Denies any SI/HI however states that she has had persistent anxiety, confusion, and disordered thinking.  Referred by urgent care for psychological evaluation..  Review of Systems  Positive: As above Negative:   Physical Exam  BP (!) 125/90 (BP Location: Right Arm)   Pulse 97   Temp 98.6 F (37 C)   Resp 16   Ht 5' 2 (1.575 m)   SpO2 100%   BMI 24.91 kg/m  Gen:   Awake, no distress   Resp:  Normal effort  MSK:   Moves extremities without difficulty  Other:    Medical Decision Making  Medically screening exam initiated at 9:35 PM.  Appropriate orders placed.  Erin Ponce was informed that the remainder of the evaluation will be completed by another provider, this initial triage assessment does not replace that evaluation, and the importance of remaining in the ED until their evaluation is complete.  Initial screening orders placed however TTS consult pending room assignment.    Myriam Dorn BROCKS, PA 07/22/24 2135    Myriam Dorn BROCKS, [place] 07/22/24 2136

## 2024-07-23 ENCOUNTER — Encounter (HOSPITAL_COMMUNITY): Payer: Self-pay

## 2024-07-23 ENCOUNTER — Other Ambulatory Visit: Payer: Self-pay

## 2024-07-23 ENCOUNTER — Encounter (HOSPITAL_COMMUNITY): Payer: Self-pay | Admitting: Psychiatry

## 2024-07-23 ENCOUNTER — Inpatient Hospital Stay (HOSPITAL_COMMUNITY)
Admission: AD | Admit: 2024-07-23 | Discharge: 2024-07-29 | DRG: 885 | Disposition: A | Discharge status: Home / Self Care | Source: Intra-hospital

## 2024-07-23 ENCOUNTER — Telehealth (HOSPITAL_COMMUNITY): Admitting: Psychiatry

## 2024-07-23 DIAGNOSIS — R45851 Suicidal ideations: Secondary | ICD-10-CM | POA: Diagnosis present

## 2024-07-23 DIAGNOSIS — G4700 Insomnia, unspecified: Secondary | ICD-10-CM | POA: Diagnosis present

## 2024-07-23 DIAGNOSIS — F259 Schizoaffective disorder, unspecified: Principal | ICD-10-CM | POA: Diagnosis present

## 2024-07-23 DIAGNOSIS — F3281 Premenstrual dysphoric disorder: Secondary | ICD-10-CM | POA: Diagnosis present

## 2024-07-23 DIAGNOSIS — F1210 Cannabis abuse, uncomplicated: Secondary | ICD-10-CM | POA: Diagnosis present

## 2024-07-23 DIAGNOSIS — Z91040 Latex allergy status: Secondary | ICD-10-CM

## 2024-07-23 DIAGNOSIS — F4310 Post-traumatic stress disorder, unspecified: Secondary | ICD-10-CM | POA: Diagnosis present

## 2024-07-23 DIAGNOSIS — Z79899 Other long term (current) drug therapy: Secondary | ICD-10-CM

## 2024-07-23 DIAGNOSIS — F250 Schizoaffective disorder, bipolar type: Secondary | ICD-10-CM

## 2024-07-23 DIAGNOSIS — N390 Urinary tract infection, site not specified: Secondary | ICD-10-CM | POA: Diagnosis present

## 2024-07-23 DIAGNOSIS — F29 Unspecified psychosis not due to a substance or known physiological condition: Secondary | ICD-10-CM

## 2024-07-23 DIAGNOSIS — F23 Brief psychotic disorder: Principal | ICD-10-CM | POA: Diagnosis present

## 2024-07-23 DIAGNOSIS — F411 Generalized anxiety disorder: Secondary | ICD-10-CM | POA: Diagnosis present

## 2024-07-23 MED ORDER — HALOPERIDOL LACTATE 5 MG/ML IJ SOLN: 10.0000 mg | Freq: Three times a day (TID) | INTRAMUSCULAR | Status: DC | PRN

## 2024-07-23 MED ORDER — DIPHENHYDRAMINE HCL 25 MG PO CAPS: 50.0000 mg | ORAL_CAPSULE | Freq: Three times a day (TID) | ORAL | Status: DC | PRN

## 2024-07-23 MED ORDER — OLANZAPINE 5 MG PO TABS
5.0000 mg | ORAL_TABLET | Freq: Two times a day (BID) | ORAL | Status: DC
  Administered 2024-07-23 – 2024-07-24 (×2): 5 mg via ORAL
  Filled 2024-07-23 (×2): qty 1

## 2024-07-23 MED ORDER — DIPHENHYDRAMINE HCL 50 MG/ML IJ SOLN: 50.0000 mg | Freq: Three times a day (TID) | INTRAMUSCULAR | Status: DC | PRN

## 2024-07-23 MED ORDER — ALUM & MAG HYDROXIDE-SIMETH 200-200-20 MG/5ML PO SUSP: 30.0000 mL | ORAL | Status: DC | PRN

## 2024-07-23 MED ORDER — HALOPERIDOL LACTATE 5 MG/ML IJ SOLN: 5.0000 mg | Freq: Three times a day (TID) | INTRAMUSCULAR | Status: DC | PRN

## 2024-07-23 MED ORDER — HALOPERIDOL 5 MG PO TABS: 5.0000 mg | ORAL_TABLET | Freq: Three times a day (TID) | ORAL | Status: DC | PRN

## 2024-07-23 MED ORDER — ACETAMINOPHEN 325 MG PO TABS
650.0000 mg | ORAL_TABLET | Freq: Four times a day (QID) | ORAL | Status: DC | PRN
  Administered 2024-07-26 – 2024-07-29 (×4): 650 mg via ORAL
  Filled 2024-07-23 (×4): qty 2

## 2024-07-23 MED ORDER — OLANZAPINE 5 MG PO TABS
5.0000 mg | ORAL_TABLET | Freq: Two times a day (BID) | ORAL | Status: DC
  Administered 2024-07-23: 5 mg via ORAL
  Filled 2024-07-23: qty 1

## 2024-07-23 MED ORDER — HYDROXYZINE HCL 25 MG PO TABS
25.0000 mg | ORAL_TABLET | Freq: Three times a day (TID) | ORAL | Status: DC | PRN
  Administered 2024-07-24 – 2024-07-28 (×9): 25 mg via ORAL
  Filled 2024-07-23 (×9): qty 1

## 2024-07-23 MED ORDER — TRAZODONE HCL 50 MG PO TABS
50.0000 mg | ORAL_TABLET | Freq: Every evening | ORAL | Status: DC | PRN
Start: 2024-07-23 — End: 2024-07-23
  Filled 2024-07-23: qty 1

## 2024-07-23 MED ORDER — MAGNESIUM HYDROXIDE 400 MG/5ML PO SUSP
30.0000 mL | Freq: Every day | ORAL | Status: DC | PRN
  Administered 2024-07-24: 30 mL via ORAL
  Filled 2024-07-23: qty 30

## 2024-07-23 MED ORDER — LORAZEPAM 2 MG/ML IJ SOLN: 2.0000 mg | Freq: Three times a day (TID) | INTRAMUSCULAR | Status: DC | PRN

## 2024-07-23 MED ORDER — TRAZODONE HCL 50 MG PO TABS
50.0000 mg | ORAL_TABLET | Freq: Every evening | ORAL | Status: DC | PRN
  Administered 2024-07-23 – 2024-07-28 (×6): 50 mg via ORAL
  Filled 2024-07-23 (×3): qty 1
  Filled 2024-07-23: qty 7
  Filled 2024-07-23 (×3): qty 1
  Filled 2024-07-23: qty 7

## 2024-07-23 NOTE — Progress Notes (Signed)
   Admission Note:   Pt is a 28 year old female admitted to [facility]. Pt went to an urgent care with abdominal pain and feelings of being dis oriented. Pt was sent to the ED. Pt has a history of schizoaffective disorder bipolar type, PTSD, GAD,  brief psychotic disorder, depression, and psychosis and has no significant medical history.  Patient presents with delusions, paranoia, tangential speech, disorganized thought content. Patient currently takes no psychotropic medications and has no services in place. Pt stated that she feels like someone is following her. She states that she has movement in her belly and that it might be a baby. She states that all she does is work and come home over and over and that she is tired. This has been going on since all the time has changed. She tries to rationalize her delusions. Pt endorses feeling paranoid, but denies SI,HI,and AVH. Admission is complete. Pt,s contents are secured in locker. Skin assessment complete. Skin is unremarkable and no contraband found. Pt was oriented to the unit. Pt is encouraged to come to staff with any needs or concerns. Nutrition is offered. Q 15 min checks for safety have begun. Pt remains safe on the unit. Will continue to monitor.

## 2024-07-23 NOTE — ED Notes (Signed)
 Please update mother [phone]

## 2024-07-23 NOTE — Progress Notes (Signed)
 Pt has been accepted to [facility] on 07/23/2024 Bed assignment: 503-02  Pt meets inpatient criteria per: Elveria Batter NP  Attending Physician will be: Dr. Kennyth MD   Report can be called to: unit: Adult unit: [phone]  Pt can arrive after [facility] WILL UPDATE   Care Team Notified: [facility] Bretta Qua RN, Elveria Batter NP  [place] Enrrique Mierzwa LCSW-A   07/23/2024 11:07 AM

## 2024-07-23 NOTE — ED Notes (Signed)
Pt going to restroom

## 2024-07-23 NOTE — ED Provider Notes (Signed)
  Physical Exam  BP (!) 137/93 (BP Location: Right Arm)   Pulse 87   Temp 98.4 F (36.9 C)   Resp 12   Ht 5' 2 (1.575 m)   SpO2 100%   BMI 24.91 kg/m   Physical Exam  Procedures  Procedures  ED Course / MDM    Medical Decision Making  Pending psychiatric eval.  At tempted earlier but reportedly somnolent.  Has pulled a code alarm repeatedly.       Patsey Lot, MD 07/23/24 [phone]

## 2024-07-23 NOTE — Plan of Care (Signed)
 Attended groups. Problem: Education: Goal: Knowledge of [place] [facility] Education information/materials will improve Outcome: Progressing Goal: Emotional status will improve Outcome: Progressing Goal: Mental status will improve Outcome: Progressing Goal: Verbalization of understanding the information provided will improve Outcome: Progressing

## 2024-07-23 NOTE — Plan of Care (Signed)
  Problem: Education: Goal: Mental status will improve Outcome: Not Progressing Goal: Verbalization of understanding the information provided will improve Outcome: Not Progressing   

## 2024-07-23 NOTE — ED Notes (Addendum)
 Gave pt 2 more waters(non-bottle)

## 2024-07-23 NOTE — ED Notes (Signed)
 Pt closed door and RN reiterated about keeping door open during her stay

## 2024-07-23 NOTE — ED Notes (Signed)
 Asked pt x2 to keep door open, pt refused, and stated that she is getting triggered due to other pt yelling. Security called for assistance to re verbalize that the door needs to remain open during her stay

## 2024-07-23 NOTE — ED Notes (Signed)
 Envelope # [id number]

## 2024-07-23 NOTE — Consult Note (Cosign Needed Addendum)
 [facility] Psychiatric Consult Initial  Patient Name: .Erin Ponce  MRN: [id number]  DOB: 1995/09/15  Consult Order details:  Orders (From admission, onward)     Start     Ordered   07/23/24 0339  CONSULT TO CALL ACT TEAM       Ordering Provider: Logan Ubaldo NOVAK, PA-C  Provider:  (Not yet assigned)  Question:  Reason for Consult?  Answer:  Psych consult   07/23/24 0338             Mode of Visit: In person    Psychiatry Consult Evaluation  Service Date: July 23, 2024 LOS:  LOS: 0 days  Chief Complaint at the recommendation of urgent care complaining of abdominal pain and reports feeling disoriented.  Primary Psychiatric Diagnoses  Schizoaffective disorder Bipolar type  2.  Psychosis    Assessment  Erin Ponce is a 28 y.o. female admitted: Presented to the EDfor 07/22/2024  8:38 PM emergency department at the recommendation of urgent care complaining of abdominal pain and reports feeling disoriented. Per urgent care note patient noted to be very paranoid.  She carries the psychiatric diagnoses of  schizoaffective disorder bipolar type, PTSD, GAD brief psychotic disorder, depression, and psychosis and has no charted significant medical history.   Her current presentation of delusions, paranoia, tangential speech, disorganized thought content, is most consistent with schizoaffective disorder bipolar type with acute exacerbation. She meets criteria for psychiatric admission based on current symptomology.  Patient currently takes no psychotropic medications and has no services in place  On initial examination, patient observed sitting in her bed awake.  Upon approach she immediately starts talking to her daughter and how they stay connected through devices.  She continues to state these devices are ocular, astronaut.  She is hyperverbal and her speech is tangential.  She previously had presented to the urgent care for abdominal pain but states they told her to come to  the hospital.  Per urgent care note patient was paranoid at that time.  She ruminates about her 30-year-old daughter.  She is paranoid and delusional.  She believes that possibly a family member is tracking her via technology maybe even without knowing that he is doing it.  She denies auditory and visual hallucinations but does admit that not too long ago she heard a child's voice through it videogame.  She currently identifies her mood as good but states she has been extremely depressed.  She switches between depression and feeling like she is on top of the world.  She cannot remember the last time she has slept it has been days.  She forgets to eat as well.  She reports being off her medications for at least 1 year.  Per chart review her last visit with Laymon Bach, NP at [facility] was in October 08, 2023.  She denies suicidal ideations.  However patient made a comment to staff that she wanted to die.  Patient states she has had that thought in her head every since hearing it from a kid at daycare.  States, I wonder with that would feel like and why he would say that.  She does not appear to be responding to internal/external stimuli.  Discussed psychiatric admission and patient is in agreement.  Also discussed restarting Zyprexa , she is hesitant but agrees to take medication.   Please see plan below for detailed recommendations.   Diagnoses:  Active Hospital problems: Principal Problem:   Psychosis [facility]) Active Problems:   Schizoaffective disorder-chronic with exacerbation (  HCC)    Plan   ## Psychiatric Medication Recommendations:  Start Zyprexa  5 mg BID  ## Medical Decision Making Capacity: Not specifically addressed in this encounter  ## Further Work-up:  -- obtain EKG  -- Pertinent labwork reviewed earlier this admission includes: UDS positive for THC,CBC, CMP, pregnancy negative,    ## Disposition:-- We recommend inpatient psychiatric hospitalization when medically cleared.  [name] under voluntary admission status at this time; please IVC if attempts to leave hospital.  ## Behavioral / Environmental: -Difficult Patient (SELECT OPTIONS FROM BELOW), To minimize splitting of staff, assign one staff person to communicate all information from the team when feasible., or Utilize compassion and acknowledge the patient's experiences while setting clear and realistic expectations for care.    ## Safety and Observation Level:  - Based on my clinical evaluation, I estimate the patient to be at low risk of self harm in the current setting. - At this time, we recommend  1:1 Observation. This decision is based on my review of the chart including patient's history and current presentation, interview of the patient, mental status examination, and consideration of suicide risk including evaluating suicidal ideation, plan, intent, suicidal or self-harm behaviors, risk factors, and protective factors. This judgment is based on our ability to directly address suicide risk, implement suicide prevention strategies, and develop a safety plan while the patient is in the clinical setting. Please contact our team if there is a concern that risk level has changed.  CSSR Risk Category:C-SSRS RISK CATEGORY: No Risk  Suicide Risk Assessment: Patient has following modifiable risk factors for suicide: untreated depression, recklessness, medication noncompliance, active mental illness (to encompass adhd, tbi, mania, psychosis, trauma reaction), current symptoms: anxiety/panic, insomnia, impulsivity, anhedonia, hopelessness, triggering events, and recent psychiatric hospitalization, which we are addressing by recommending IP psychiatric admission . Patient has following non-modifiable or demographic risk factors for suicide: psychiatric hospitalization Patient has the following protective factors against suicide: Access to outpatient mental health care, Supportive family, Minor children in the home,  and no history of suicide attempts  Thank you for this consult request. Recommendations have been communicated to the primary team.  We will continue to follow while awaiting IP bed availability  at this time.   Elveria VEAR Batter, NP       History of Present Illness  Relevant Aspects of Hospital ED Course:  Admitted on 07/22/2024 for 07/22/2024  8:38 PM emergency department at the recommendation of urgent care complaining of abdominal pain and reports feeling disoriented. Per urgent care note patient noted to be very paranoid.  She carries the psychiatric diagnoses of  schizoaffective disorder bipolar type, PTSD, GAD brief psychotic disorder, depression, and psychosis and has no charted significant medical history.   Patient Report:  I been communicating with my daughter through devices, like the astronaut  Ubaldo High PA-C emergency room physician, Nkechi Linehan is a 28 y.o. female.  Patient with past medical history significant for major depressive disorder with psychotic features, psychosis, miscarriage presents to the emergency department at the recommendation of urgent care.  She went to urgent care complaining of abdominal pain and reports feeling disoriented.  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  She states she feels jealous because other people she knows are getting pregnant.  She then states that she believes she just had a miscarriage.  Urgent care recommended she come for mental health evaluation.  She denies active hallucinations, SI, HI.  At the time of my assessment  she states her abdominal pain has subsided and that she feels that she is likely jealous of other pregnant people and feeling anger.   Psych ROS:  Depression: endorses Anxiety:  endorses Mania (lifetime and current): endorses Psychosis: (lifetime and current): hx of   Collateral information:  Edmonia Lamer Mother [phone] states she was unsure if patient was in the hospital.   However she had recently spoke to patient and she felt that she did not sound right that she was talking crazy things.  She also knew that patient was having difficulty with her medications.  She does not know that patient was off of her medications.    Review of Systems  Constitutional:  Negative for fever.  Respiratory:  Negative for cough.   Gastrointestinal:  Negative for nausea and vomiting.  Neurological:  Negative for tremors.  Psychiatric/Behavioral:  Positive for depression. The patient is nervous/anxious and has insomnia.      Psychiatric and Social History  Psychiatric History:  Information collected from chart review and pt   Prev Dx/Sx: schizoaffective disorder bipolar type, PTSD, GAD brief psychotic disorder, depression, and psychosis  Current Psych Provider: none currently-has seen Brittany Parsons at [facility] in the past Home Meds (current): zyprexa  5 mg at bedtime Hydroxyzine  10 TID PRN, Trazodone  50 at bedtime PRN Previous Med Trials: Risperdal , zyprexa , paliperidone  injection  Therapy: None currently  Prior Psych Hospitalization: Yes has been hospitalized at [facility] Prior Self Harm: Denies Prior Violence: Denies  Family Psych History: Denies Family Hx suicide: Denies  Social History:  Developmental Hx: Denies Educational Hx: Associates degree 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 Hx: Denies Living Situation: Lives with her boyfriend and his wife Spiritual Hx: Yes Access to weapons/lethal means: Denies  Substance History Alcohol: Denies Tobacco: Denies Illicit drugs: Denies substance use but he is positive for marijuana Prescription drug abuse: Denies Rehab hx: Denies  Exam Findings  Physical Exam:  Vital Signs:  Temp:  [98.4 F (36.9 C)-98.6 F (37 C)] 98.4 F (36.9 C) (11/18 0001) Pulse Rate:  [87-97] 87 (11/18 0001) Resp:  [12-16] 12 (11/18 0001) BP: (125-137)/(90-93) 137/93 (11/18 0001) SpO2:  [100 %] 100 % (11/18  0001) Blood pressure (!) 137/93, pulse 87, temperature 98.4 F (36.9 C), resp. rate 12, height 5' 2 (1.575 m), SpO2 100%. Body mass index is 24.91 kg/m.  Physical Exam Pulmonary:     Effort: No respiratory distress.  Musculoskeletal:     Cervical back: Normal range of motion.  Neurological:     Mental Status: She is alert and oriented to person, place, and time.  Psychiatric:        Attention and Perception: She is inattentive.        Mood and Affect: Mood is anxious and depressed. Affect is labile and tearful.        Speech: Speech is tangential.        Behavior: Behavior is cooperative.        Thought Content: Thought content is paranoid and delusional.        Cognition and Memory: Memory normal.        Judgment: Judgment is impulsive.     Mental Status Exam: General Appearance: Casual  Orientation:  Full (Time, Place, and Person)  Memory:  Immediate;   Fair Recent;   Fair Remote;   Fair  Concentration:  Concentration: Fair and Attention Span: Fair  Recall:  Fair  Attention  Fair  Eye Contact:  Good  Speech:  Clear and Coherent and hyper verbal  Language:  Good  Volume:  Normal  Mood: good now I am talking to yall  Affect:  Labile  Thought Process:  Disorganized and Descriptions of Associations: Tangential  Thought Content:  Delusions, Paranoid Ideation, Rumination, and Tangential  Suicidal Thoughts:  No  Homicidal Thoughts:  No  Judgement:  Impaired  Insight:  Lacking  Psychomotor Activity:  Restlessness  Akathisia:  No  Fund of Knowledge:  Good      Assets:  Communication Skills Desire for Improvement Physical Health Resilience Social Support  Cognition:  Impaired,  Mild  ADL's:  Intact  AIMS (if indicated):        Other History   These have been pulled in through the EMR, reviewed, and updated if appropriate.  Family History:  The patient's family history includes Diabetes in her mother; Thyroid disease in her mother.  Medical History: Past  Medical History:  Diagnosis Date   Anxiety    Dislocated knee 2008   R knee   Miscarriage     Surgical History: Past Surgical History:  Procedure Laterality Date   NO PAST SURGERIES       Medications:   Current Facility-Administered Medications:    OLANZapine  (ZYPREXA ) tablet 5 mg, 5 mg, Oral, BID, Mardy Coy H, NP, 5 mg at 07/23/24 1000   traZODone  (DESYREL ) tablet 50 mg, 50 mg, Oral, QHS PRN, Logan Ubaldo NOVAK, PA-C  Current Outpatient Medications:    fluconazole  (DIFLUCAN ) 150 MG tablet, Take 1 tablet (150 mg total) by mouth daily. (Patient not taking: Reported on 07/23/2024), Disp: 1 tablet, Rfl: 1   hydrOXYzine  (ATARAX ) 10 MG tablet, Take 1 tablet (10 mg total) by mouth 3 (three) times daily as needed. (Patient not taking: Reported on 02/05/2024), Disp: 90 tablet, Rfl: 0   metroNIDAZOLE  (FLAGYL ) 500 MG tablet, Take 1 tablet (500 mg total) by mouth 2 (two) times daily. (Patient not taking: Reported on 07/23/2024), Disp: 14 tablet, Rfl: 0   OLANZapine  (ZYPREXA ) 5 MG tablet, Take 1 tablet (5 mg total) by mouth at bedtime. (Patient not taking: Reported on 02/05/2024), Disp: 30 tablet, Rfl: 2   traZODone  (DESYREL ) 50 MG tablet, Take 1 tablet (50 mg total) by mouth at bedtime as needed for sleep. (Patient not taking: Reported on 02/05/2024), Disp: 30 tablet, Rfl: 3  Allergies: Allergies  Allergen Reactions   Latex Itching    Yohann Curl H Drew Herman, NP

## 2024-07-23 NOTE — ED Notes (Signed)
 Pt eating lunch at this time

## 2024-07-23 NOTE — ED Notes (Signed)
 [facility] contacted to transport patient to [facility].  There is no ETA and the RN made aware.

## 2024-07-23 NOTE — ED Notes (Signed)
Pt went to restroom

## 2024-07-23 NOTE — Group Note (Deleted)
 Date:  07/23/2024 Time:  5:09 PM

## 2024-07-23 NOTE — ED Notes (Signed)
 Pt Is in shower brushing her teeth

## 2024-07-23 NOTE — Group Note (Signed)
 Date:  07/23/2024 Time:  8:21 PM  Group Topic/Focus:  Wrap-Up Group:   The focus of this group is to help patients review their daily goal of treatment and discuss progress on daily workbooks.    Participation Level:  Active  Participation Quality:  Appropriate  Affect:  Appropriate  Cognitive:  Appropriate  Insight: Appropriate  Engagement in Group:  Engaged  Modes of Intervention:  Education and Exploration  Additional Comments:  Patient attended and participated in group tonight.  She reports that since she has been her she learnt that its important to get rest.  Gwenn Chillington Dacosta 07/23/2024, 8:21 PM

## 2024-07-23 NOTE — ED Notes (Signed)
 Pt continues to pull the code alarm.

## 2024-07-23 NOTE — ED Provider Notes (Addendum)
 [facility] EMERGENCY DEPARTMENT AT [facility] Provider Note   CSN: [id number] Arrival date & time: 07/22/24  2004     Patient presents with: Abdominal Pain and Psychiatric Evaluation   Erin Ponce is a 28 y.o. female.  Patient with past medical history significant for major depressive disorder with psychotic features, psychosis, miscarriage presents to the emergency department at the recommendation of urgent care.  She went to urgent care complaining of abdominal pain and reports feeling disoriented.  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  She states she feels jealous because other people she knows are getting pregnant.  She then states that she believes she just had a miscarriage.  Urgent care recommended she come for mental health evaluation.  She denies active hallucinations, SI, HI.  At the time of my assessment she states her abdominal pain has subsided and that she feels that she is likely jealous of other pregnant people and feeling anger.    Abdominal Pain      Prior to Admission medications   Medication Sig Start Date End Date Taking? Authorizing Provider  fluconazole  (DIFLUCAN ) 150 MG tablet Take 1 tablet (150 mg total) by mouth daily. 02/11/24   Celestia Rosaline SQUIBB, NP  hydrOXYzine  (ATARAX ) 10 MG tablet Take 1 tablet (10 mg total) by mouth 3 (three) times daily as needed. Patient not taking: Reported on 02/05/2024 10/25/23   Harl Zane BRAVO, NP  metroNIDAZOLE  (FLAGYL ) 500 MG tablet Take 1 tablet (500 mg total) by mouth 2 (two) times daily. 02/11/24   Celestia Rosaline SQUIBB, NP  OLANZapine  (ZYPREXA ) 5 MG tablet Take 1 tablet (5 mg total) by mouth at bedtime. Patient not taking: Reported on 02/05/2024 12/13/23   Harl Zane BRAVO, NP  traZODone  (DESYREL ) 50 MG tablet Take 1 tablet (50 mg total) by mouth at bedtime as needed for sleep. Patient not taking: Reported on 02/05/2024 12/13/23   Harl Zane BRAVO, NP  Cetirizine  HCl 10 MG CAPS Take 1 capsule  (10 mg total) by mouth daily. 07/07/19 04/08/20  Wieters, Hallie C, PA-C    Allergies: Latex    Review of Systems  Gastrointestinal:  Positive for abdominal pain.    Updated Vital Signs BP (!) 137/93 (BP Location: Right Arm)   Pulse 87   Temp 98.4 F (36.9 C)   Resp 12   Ht 5' 2 (1.575 m)   SpO2 100%   BMI 24.91 kg/m   Physical Exam Vitals and nursing note reviewed.  Constitutional:      General: She is not in acute distress.    Appearance: She is well-developed.  HENT:     Head: Normocephalic and atraumatic.  Eyes:     Conjunctiva/sclera: Conjunctivae normal.  Cardiovascular:     Rate and Rhythm: Normal rate and regular rhythm.     Heart sounds: No murmur heard. Pulmonary:     Effort: Pulmonary effort is normal. No respiratory distress.     Breath sounds: Normal breath sounds.  Abdominal:     Palpations: Abdomen is soft.     Tenderness: There is no abdominal tenderness.  Musculoskeletal:        General: No swelling.     Cervical back: Neck supple.  Skin:    General: Skin is warm and dry.     Capillary Refill: Capillary refill takes less than 2 seconds.  Neurological:     Mental Status: She is alert.  Psychiatric:        Mood and Affect:  Mood normal.     (all labs ordered are listed, but only abnormal results are displayed) Labs Reviewed  URINALYSIS, ROUTINE W REFLEX MICROSCOPIC - Abnormal; Notable for the following components:      Result Value   APPearance CLOUDY (*)    Specific Gravity, Urine 1.031 (*)    Hgb urine dipstick SMALL (*)    Ketones, ur 20 (*)    Protein, ur 30 (*)    Leukocytes,Ua TRACE (*)    Bacteria, UA RARE (*)    All other components within normal limits  RAPID URINE DRUG SCREEN, HOSP PERFORMED - Abnormal; Notable for the following components:   Tetrahydrocannabinol POSITIVE (*)    All other components within normal limits  COMPREHENSIVE METABOLIC PANEL WITH GFR  CBC WITH DIFFERENTIAL/PLATELET  HCG, SERUM, QUALITATIVE     EKG: None  Radiology: No results found.   Procedures   Medications Ordered in the ED  traZODone  (DESYREL ) tablet 50 mg (has no administration in time range)                                    Medical Decision Making Risk Prescription drug management.   This patient presents to the ED for concern of abdominal pain and anger/anxiety/PTSD, this involves an extensive number of treatment options, and is a complaint that carries with it a high risk of complications and morbidity.  The differential diagnosis includes musculoskeletal pain, appendicitis, cholecystitis, colitis, others   Co morbidities / Chronic conditions that complicate the patient evaluation  Behavioral health history   Additional history obtained:  Additional history obtained from EMR External records from outside source obtained and reviewed including urgent care note   Lab Tests:  I Ordered, and personally interpreted labs.  The pertinent results include: Negative pregnancy test, UDS positive for [facility]   Imaging Studies ordered:  Patient has no abdominal tenderness on examination.  No indication for emergent abdominal imaging.  No sign of surgical/acute abdomen   Problem List / ED Course / Critical interventions / Medication management   I ordered medication including trazodone      Consultations Obtained:  I requested consultation with the behavioral health team   Social Determinants of Health:  [name] self pay   Test / Admission - Considered:  Patient with unremarkable lab workup.  No abdominal tenderness on exam.  Suspect behavioral health disorder as underlying cause of patient's symptoms as patient continues to be fixated on her miscarriage from 8 years ago. Patient agreeable to voluntary psychiatric evaluation.  [name] medically clear. The patient does not appear to meet criteria for IVC at this time.       Final diagnoses:  Psychosis, unspecified psychosis type [facility])     ED Discharge Orders     None          Logan Ubaldo KATHEE DEVONNA 07/23/24 0336    Logan Ubaldo KATHEE, PA-C 07/23/24 0336    Palumbo, April, MD 07/23/24 [phone]

## 2024-07-23 NOTE — ED Notes (Signed)
 IVC Case# [id number]

## 2024-07-23 NOTE — BH Assessment (Signed)
 TTS attempted to assess pt. Per Rosina, RN, she attempted to awake pt but pt continues to appear lethargic. Pt will be seen during day shift.

## 2024-07-23 NOTE — ED Notes (Signed)
IVC in process  

## 2024-07-23 NOTE — ED Notes (Signed)
 Pt refused breakfast

## 2024-07-23 NOTE — ED Notes (Signed)
 Pt taking shower.

## 2024-07-23 NOTE — ED Notes (Signed)
 Ivc docs placed in red folder, 3 to purple and 1 to medical records

## 2024-07-23 NOTE — Tx Team (Signed)
 Initial Treatment Plan 07/23/2024 6:34 PM Erin Ponce [id number]    PATIENT STRESSORS: Medication change or noncompliance     PATIENT STRENGTHS: Supportive family/friends    PATIENT IDENTIFIED PROBLEMS: Paranoia  depression                   DISCHARGE CRITERIA:  Improved stabilization in mood, thinking, and/or behavior Motivation to continue treatment in a less acute level of care  PRELIMINARY DISCHARGE PLAN: Outpatient therapy Return to previous living arrangement  PATIENT/FAMILY INVOLVEMENT: This treatment plan has been presented to and reviewed with the patient, Erin Ponce.  The patient and family have been given the opportunity to ask questions and make suggestions.  [name], RN 07/23/2024, 6:34 PM

## 2024-07-23 NOTE — ED Notes (Signed)
 Pt appears to be quite agitated.pt pacing in and out of her room.

## 2024-07-24 ENCOUNTER — Encounter (HOSPITAL_COMMUNITY): Payer: Self-pay

## 2024-07-24 MED ORDER — RISPERIDONE 1 MG PO TABS: 1.0000 mg | ORAL_TABLET | Freq: Every day | ORAL | Status: DC

## 2024-07-24 MED ORDER — NITROFURANTOIN MONOHYD MACRO 100 MG PO CAPS
100.0000 mg | ORAL_CAPSULE | Freq: Two times a day (BID) | ORAL | Status: AC
  Administered 2024-07-24 – 2024-07-29 (×10): 100 mg via ORAL
  Filled 2024-07-24 (×10): qty 1

## 2024-07-24 MED ORDER — NITROFURANTOIN MONOHYD MACRO 100 MG PO CAPS: 100.0000 mg | ORAL_CAPSULE | Freq: Two times a day (BID) | ORAL | Status: DC

## 2024-07-24 MED ORDER — MELATONIN 3 MG PO TABS
3.0000 mg | ORAL_TABLET | Freq: Once | ORAL | Status: AC
  Administered 2024-07-24: 3 mg via ORAL
  Filled 2024-07-24: qty 1

## 2024-07-24 MED ORDER — RISPERIDONE 1 MG PO TABS
1.0000 mg | ORAL_TABLET | Freq: Two times a day (BID) | ORAL | Status: DC
  Administered 2024-07-24 – 2024-07-25 (×2): 1 mg via ORAL
  Filled 2024-07-24 (×2): qty 1

## 2024-07-24 NOTE — BHH Group Notes (Signed)
 Adult Psychoeducational Group Note  Date:  07/24/2024 Time:  11:20 AM  Group Topic/Focus:  Goals Group:   The focus of this group is to help patients establish daily goals to achieve during treatment and discuss how the patient can incorporate goal setting into their daily lives to aide in recovery. Orientation:   The focus of this group is to educate the patient on the purpose and policies of crisis stabilization and provide a format to answer questions about their admission.  The group details unit policies and expectations of patients while admitted.  Participation Level:  Active  Participation Quality:  Appropriate  Affect:  Appropriate  Cognitive:  Appropriate  Insight: Appropriate  Engagement in Group:  Engaged  Modes of Intervention:  Discussion  Additional Comments:  Pt attended the goals group and remained appropriate and engaged throughout the duration of the group.   Ciarrah Rae O 07/24/2024, 11:20 AM

## 2024-07-24 NOTE — Progress Notes (Signed)
 Recreation Therapy Notes  INPATIENT RECREATION THERAPY ASSESSMENT  Patient Details Name: Erin Ponce MRN: [id number] DOB: 12-Dec-1995 Today's Date: 07/24/2024       Information Obtained From: Patient  Able to Participate in Assessment/Interview: Yes  Patient Presentation: Alert  Reason for Admission (Per Patient): Other (Comments) (overworking self; depressed)  Patient Stressors: Work  Pharmacologist:   Film/video Editor, Journal, TV, Music, Art, Prayer, Talk, Avoidance, Read, Dance, Hot Bath/Shower  Leisure Interests (2+):  Individual - Reading  Frequency of Recreation/Participation: Other (Comment) (Daily)  Awareness of Community Resources:  Yes  Community Resources:  [place], [employer]  Current Use: Yes  If no, Barriers?:    Expressed Interest in [employer] Information: No  [employer] of Residence:  [place]  Patient Main Form of Transportation: Set Designer  Patient Strengths:  Reading  Patient Identified Areas of Improvement:  all of them  Patient Goal for Hospitalization:  want to work on emotions (staying calm)  Current SI (including self-harm):  No  Current HI:  No  Current AVH: No  Staff Intervention Plan: Group Attendance, Collaborate with Interdisciplinary Treatment Team  Consent to Intern Participation: N/A   Juanisha Bautch-McCall, LRT,CTRS Zenya Hickam A Kashena Novitski-McCall 07/24/2024, 2:47 PM

## 2024-07-24 NOTE — Progress Notes (Signed)
 EKG didn't transmit. Paper tracing on front of chart.

## 2024-07-24 NOTE — BHH Suicide Risk Assessment (Signed)
 Suicide Risk Assessment  Admission Assessment    [facility] Admission Suicide Risk Assessment   Nursing information obtained from:  Patient Demographic factors:  Adolescent or young adult, Low socioeconomic status Current Mental Status:  NA Loss Factors:  NA Historical Factors:  NA Risk Reduction Factors:  Responsible for children under 28 years of age, Living with another person, especially a relative Principal Problem: <principal problem not specified> Diagnosis:  Active Problems:   Brief psychotic disorder (HCC)  Subjective Data: Erin Ponce is a 28 y.o. female  with a past psychiatric history of schizoaffective disorder bipolar type, PTSD, GAD brief psychotic disorder, depression, and psychosis. Patient initially arrived to [facility] on 07/23/24 for 07/24/24 and was admitted to [facility] under IVC on 07/24/24 for crisis stabalization and stabilization of acute on chronic psychiatric conditions. She has no significant past medical history.   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 She describes feeling as though people are following her and talking about her. She denies auditory or visual hallucinations.   She reports a history of similar annual episodes characterized by paranoia, depression, irritability, anger, and short temper. She states her first episode occurred approximately six years ago, a few months after the birth of her daughter, at which time she experienced paranoia and disorganized thinking and was diagnosed with schizoaffective disorder. She was hospitalized at that time and started on antipsychotic medication.   She is unable to recall exact medication timelines but reports prior trials of Zyprexa , Risperdal , Invega  LAI, Seroquel , and Abilify . She states she did best on risperidone  but it was discontinued  due to elevated prolactin levels, though she denies a history of galactorrhea or breast enlargement while on this medication.   Patient endorses anxiety symptoms, including racing thoughts and excessive worry about the future. She reports cannabis use and had a positive urine drug screen but denies use of other substances. She states she lives with her boyfriend and his wife and feels she has a good social support system.  Continued Clinical Symptoms:  Alcohol Use Disorder Identification Test Final Score (AUDIT): 0 The Alcohol Use Disorders Identification Test, Guidelines for Use in Primary Care, Second Edition.  World Science Writer [facility]). Score between 0-7:  no or low risk or alcohol related problems. Score between 8-15:  moderate risk of alcohol related problems. Score between 16-19:  high risk of alcohol related problems. Score 20 or above:  warrants further diagnostic evaluation for alcohol dependence and treatment.  CLINICAL FACTORS:   Dysthymia Previous Psychiatric Diagnoses and Treatments   Musculoskeletal: Strength & Muscle Tone: within normal limits Gait & Station: normal Patient leans: N/A  Psychiatric Specialty Exam:  Presentation  General Appearance: Appropriate for Environment; Casual  Eye Contact:Good  Speech:Clear and Coherent; Normal Rate  Speech Volume:Normal  Handedness:No data recorded  Mood and Affect  Mood:Anxious  Affect:Congruent; Appropriate   Thought Process  Thought Processes:Coherent  Descriptions of Associations:Intact  Orientation:Full (Time, Place and Person)  Thought Content:WDL  History of Schizophrenia/Schizoaffective disorder:No data recorded Duration of Psychotic Symptoms:No data recorded Hallucinations:Hallucinations: None  Ideas of Reference:None  Suicidal Thoughts:Suicidal Thoughts: No  Homicidal Thoughts:Homicidal Thoughts: No   Sensorium  Memory:Immediate Good; Recent  Good  Judgment:Fair  Insight:Fair   Executive Functions  Concentration:Fair  Attention Span:Fair  Recall:Fair  Fund of Knowledge:Good  Language:Good   Psychomotor Activity  Psychomotor  Activity:Psychomotor Activity: Normal   Assets  Assets:Desire for Improvement; Social Support   Sleep  Sleep:Sleep: Fair    Physical Exam: Physical Exam Vitals and nursing note reviewed.  Constitutional:      General: She is not in acute distress.    Appearance: Normal appearance. She is normal weight. She is not ill-appearing.  HENT:     Head: Normocephalic and atraumatic.  Pulmonary:     Effort: Pulmonary effort is normal. No respiratory distress.  Neurological:     General: No focal deficit present.     Mental Status: She is alert and oriented to person, place, and time. Mental status is at baseline.    Review of Systems  Gastrointestinal:  Negative for abdominal pain, constipation, diarrhea, nausea and vomiting.  Neurological:  Negative for dizziness and headaches.  All other systems reviewed and are negative.  Blood pressure 123/77, pulse 86, temperature 98.4 F (36.9 C), temperature source Oral, resp. rate 18, height 5' 2 (1.575 m), weight 61.8 kg, SpO2 100%. Body mass index is 24.92 kg/m.   COGNITIVE FEATURES THAT CONTRIBUTE TO RISK:  None    SUICIDE RISK:  Mild:  Suicidal ideation of limited frequency, intensity, duration, and specificity.  There are no identifiable plans, no associated intent, mild dysphoria and related symptoms, good self-control (both objective and subjective assessment), few other risk factors, and identifiable protective factors, including available and accessible social support.  PLAN OF CARE:   Psychiatric Diagnoses and Treatment   # Brief psychosis # Cannabis use disorder # Depression/ Anxiety - Start Risperdal  1 mg BID   PRN's - Trazodone  50 mg at bedtime as needed for insomnia - Atarax  25 mg TID as needed for anxiety -  Agitation Protocol: haldol  + ativan  + benadryl    2. Active Medical Issues   #UTI - Macrobid 100 mg every 12 hours for 5 day   #Nicotine withdrawal - Patient does not need nicotine replacement   Other as needed medications  Tylenol  650 mg every 6 hours as needed for pain Mylanta 30 mL every 4 hours as needed for indigestion Milk of magnesia 30 mL daily as needed for constipation   The risks/benefits/side-effects/alternatives to the above medication(s) were discussed in detail with the patient and time was given for questions. The patient consents to medication trial. FDA black box warnings, if present, were discussed.   3. Safety and Monitoring: - Involuntary admission to inpatient psychiatric unit for safety, stabilization and treatment - Daily contact with patient to assess and evaluate symptoms and progress in treatment - Patient's case to be discussed in multi-disciplinary team meeting - Observation Level: q15 minute checks - Vital signs: q12 hours - Precautions: suicide, elopement, and assault   4. Routine and other pertinent labs: EKG monitoring: QTc: 395   Metabolism / endocrine: BMI: Body mass index is 24.92 kg/m.   CBC: unremarkable CMP: unremarkable UDS: THC UA: unremarkable Pregnancy test: negative TSH: pending A1c: pending Lipid panel: pending   5.   Group Therapy: - Encouraged patient to participate in unit milieu and in scheduled group therapies  - Short Term Goals: Ability to identify changes in lifestyle to reduce recurrence of condition will improve and Ability to disclose and discuss suicidal ideas - Long Term Goals: Improvement in symptoms so as ready for discharge - [name] encouraged to participate in group therapy while admitted to the psychiatric unit. - We will address other chronic and acute stressors, which contributed to the patient's brief psychosis in order to reduce  the risk of self-harm at discharge.   7.   Discharge Planning:  -  Social work and case management to assist with discharge planning and identification of hospital follow-up needs prior to discharge - Estimated LOS: 5-7 days - Discharge Concerns: Need to establish a safety plan; Medication compliance and effectiveness - Discharge Goals: Return home with outpatient referrals for mental health follow-up including medication management/psychotherapy  I certify that inpatient services furnished can reasonably be expected to improve the patient's condition.   Ashley LOISE Gravely, MD 07/24/2024, 4:36 PM

## 2024-07-24 NOTE — BHH Group Notes (Signed)
 BHH Group Notes:  (Nursing/MHT/Case Management/Adjunct)  Date:  07/24/2024  Time:  9:52 PM  Type of Therapy:  Psychoeducational Skills  Participation Level:  Active  Participation Quality:  Appropriate  Affect:  Appropriate  Cognitive:  Appropriate  Insight:  Appropriate  Engagement in Group:  Developing/Improving  Modes of Intervention:  Education  Summary of Progress/Problems: Patient rated her day as a 9 out of a possible 10. She states that she had no problems today and was able to socialize with her peers. She learned today that her discharge plans are being organized and was able to talk to her child psychotherapist.   Maurina Fawaz S 07/24/2024, 9:52 PM

## 2024-07-24 NOTE — Progress Notes (Signed)
 Pt pulse was 162 on re-check manually it was 120 , NP on call notified

## 2024-07-24 NOTE — Progress Notes (Signed)
 Pt lightheaded upon ambulation and tachycardic.  EKG complete and provider notified

## 2024-07-24 NOTE — H&P (Signed)
 Psychiatric Admission Assessment Adult  Patient Identification:  Erin Ponce MRN:  [id number] Date of Evaluation:  07/24/2024 Chief Complaint:  Schizoaffective disorder (HCC) [F25.9] Principal Diagnosis:  Schizoaffective disorder (HCC) Diagnosis:  Principal Problem:   Schizoaffective disorder (HCC)   SUBJECTIVE:  HPI: Erin Ponce is a 28 y.o. female  with a past psychiatric history of schizoaffective disorder bipolar type, PTSD, GAD brief psychotic disorder, depression, and psychosis. Patient initially arrived to [facility] on 07/23/24 for 07/24/24 and was admitted to [facility] under IVC on 07/24/24 for crisis stabalization and stabilization of acute on chronic psychiatric conditions. She has no significant past medical history.  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 She describes feeling as though people are following her and talking about her. She denies auditory or visual hallucinations.  She reports a history of similar annual episodes characterized by paranoia, depression, irritability, anger, and short temper. She states her first episode occurred approximately six years ago, a few months after the birth of her daughter, at which time she experienced paranoia and disorganized thinking and was diagnosed with schizoaffective disorder. She was hospitalized at that time and started on antipsychotic medication.  She is unable to recall exact medication timelines but reports prior trials of Zyprexa , Risperdal , Invega  LAI, Seroquel , and Abilify . She states she did best on risperidone  but it was discontinued due to elevated prolactin levels, though she denies a history of galactorrhea or breast enlargement while on this medication.  Patient endorses anxiety symptoms, including racing thoughts and excessive worry  about the future. She reports cannabis use and had a positive urine drug screen but denies use of other substances. She states she lives with her boyfriend and his wife and feels she has a good social support system.   Past Psychiatric History: Prev Dx/Sx: schizoaffective disorder bipolar type, PTSD, GAD brief psychotic disorder, depression, and psychosis  Current Psych Provider: none currently-has seen Brittany Parsons at [facility] in the past Home Meds (current): zyprexa  5 mg at bedtime Hydroxyzine  10 TID PRN, Trazodone  50 at bedtime PRN Previous Med Trials: Risperdal , zyprexa , paliperidone  injection  Therapy: None currently   Prior Psych Hospitalization: Yes, has been hospitalized at [facility] Prior Self Harm: Denies Prior Violence: Denies  Substance Abuse History: Patient reports history of marijuana use. Denies other substance use.  Social History: Patient currently works as a water engineer. Reports significant stress and anxiety from her job. Currently lives with her boyfriend and his wife. She has 1 daughter who is 37 years old.  Family Psychiatric History: Unknown  Columbia Scale:  Flowsheet Row Ad[facility] (Current) [facility] from 07/22/2024 in [facility] Department [facility] Video Visit from 10/25/2023 in [facility]  C-SSRS RISK CATEGORY No Risk No Risk Error: Q7 should not be populated when Q6 is No     Tobacco Screening:  Social History   Tobacco Use  Smoking Status Never  Smokeless Tobacco Never    BH Tobacco Counseling     Are you interested in Tobacco Cessation Medications?  No, patient refused Counseled patient on smoking cessation:  Refused/Declined practical counseling Reason Tobacco Screening Not Completed: Patient Refused Screening    Allergies:   Allergies  Allergen Reactions   Latex Itching    OBJECTIVE:  Physical  Examination:  Physical  Exam Vitals and nursing note reviewed.  Constitutional:      General: She is not in acute distress.    Appearance: Normal appearance. She is normal weight. She is not ill-appearing.  HENT:     Head: Normocephalic and atraumatic.  Pulmonary:     Effort: Pulmonary effort is normal. No respiratory distress.  Neurological:     General: No focal deficit present.     Mental Status: She is alert and oriented to person, place, and time. Mental status is at baseline.    Review of Systems  Gastrointestinal:  Negative for abdominal pain, constipation, diarrhea, nausea and vomiting.  Neurological:  Negative for dizziness and headaches.  All other systems reviewed and are negative.  Blood pressure (!) 129/93, pulse (!) 120, temperature 98.4 F (36.9 C), temperature source Oral, resp. rate 18, height 5' 2 (1.575 m), weight 61.8 kg, SpO2 100%. Body mass index is 24.92 kg/m.  Metabolic disorder labs:  Lab Results  Component Value Date   HGBA1C 5.5 01/23/2024   MPG 116.89 10/28/2020   MPG 108.28 04/03/2018   Lab Results  Component Value Date   PROLACTIN 195.0 (H) 11/23/2020   PROLACTIN 160.1 (H) 04/03/2018   Lab Results  Component Value Date   CHOL 146 10/03/2022   TRIG 52 10/03/2022   HDL 48 10/03/2022   CHOLHDL 3.0 10/03/2022   VLDL 12 10/28/2020   LDLCALC 87 10/03/2022   LDLCALC 72 10/28/2020    Results for orders placed or performed during the hospital encounter of 07/22/24 (from the past 48 hours)  Urinalysis, Routine w reflex microscopic -Urine, Clean Catch     Status: Abnormal   Collection Time: 07/22/24  9:35 PM  Result Value Ref Range   Color, Urine YELLOW YELLOW   APPearance CLOUDY (A) CLEAR   Specific Gravity, Urine 1.031 (H) 1.005 - 1.030   pH 5.0 5.0 - 8.0   Glucose, UA NEGATIVE NEGATIVE mg/dL   Hgb urine dipstick SMALL (A) NEGATIVE   Bilirubin Urine NEGATIVE NEGATIVE   Ketones, ur 20 (A) NEGATIVE mg/dL   Protein, ur 30 (A) NEGATIVE mg/dL   Nitrite NEGATIVE  NEGATIVE   Leukocytes,Ua TRACE (A) NEGATIVE   RBC / HPF 0-5 0 - 5 RBC/hpf   WBC, UA 6-10 0 - 5 WBC/hpf   Bacteria, UA RARE (A) NONE SEEN   Squamous Epithelial / HPF 21-50 0 - 5 /HPF   Mucus PRESENT     Comment: Performed at [facility], [address] [place], [place] [address]  Urine rapid drug screen (hosp performed)     Status: Abnormal   Collection Time: 07/22/24  9:35 PM  Result Value Ref Range   Opiates NONE DETECTED NONE DETECTED   Cocaine NONE DETECTED NONE DETECTED   Benzodiazepines NONE DETECTED NONE DETECTED   Amphetamines NONE DETECTED NONE DETECTED   Tetrahydrocannabinol POSITIVE (A) NONE DETECTED   Barbiturates NONE DETECTED NONE DETECTED    Comment: (NOTE) DRUG SCREEN FOR MEDICAL PURPOSES ONLY.  IF CONFIRMATION IS NEEDED FOR ANY PURPOSE, NOTIFY LAB WITHIN 5 DAYS.  LOWEST DETECTABLE LIMITS FOR URINE DRUG SCREEN Drug Class                     Cutoff (ng/mL) Amphetamine and metabolites    1000 Barbiturate and metabolites    200 Benzodiazepine                 200 Opiates and metabolites  300 Cocaine and metabolites        300 THC                            50 Performed at [facility], [address] [place], [place] [address]   Comprehensive metabolic panel     Status: None   Collection Time: 07/22/24  9:47 PM  Result Value Ref Range   Sodium 137 135 - 145 mmol/L   Potassium 3.5 3.5 - 5.1 mmol/L   Chloride 104 98 - 111 mmol/L   CO2 22 22 - 32 mmol/L   Glucose, Bld 97 70 - 99 mg/dL    Comment: Glucose reference range applies only to samples taken after fasting for at least 8 hours.   BUN 11 6 - 20 mg/dL   Creatinine, Ser 9.14 0.44 - 1.00 mg/dL   Calcium 9.6 8.9 - 89.6 mg/dL   Total Protein 7.7 6.5 - 8.1 g/dL   Albumin 4.5 3.5 - 5.0 g/dL   AST 19 15 - 41 U/L   ALT 15 0 - 44 U/L   Alkaline Phosphatase 46 38 - 126 U/L   Total Bilirubin 0.6 0.0 - 1.2 mg/dL   GFR, Estimated >39 >39 mL/min    Comment: (NOTE) Calculated using the CKD-EPI  Creatinine Equation (2021)    Anion gap 11 5 - 15    Comment: Performed at [facility], [address], [place], [place] [address]  CBC with Differential     Status: None   Collection Time: 07/22/24  9:47 PM  Result Value Ref Range   WBC 8.7 4.0 - 10.5 K/uL   RBC 4.72 3.87 - 5.11 MIL/uL   Hemoglobin 13.2 12.0 - 15.0 g/dL   HCT 58.2 63.9 - 53.9 %   MCV 88.3 80.0 - 100.0 fL   MCH 28.0 26.0 - 34.0 pg   MCHC 31.7 30.0 - 36.0 g/dL   RDW 85.2 88.4 - 84.4 %   Platelets 285 150 - 400 K/uL   nRBC 0.0 0.0 - 0.2 %   Neutrophils Relative % 49 %   Neutro Abs 4.3 1.7 - 7.7 K/uL   Lymphocytes Relative 42 %   Lymphs Abs 3.7 0.7 - 4.0 K/uL   Monocytes Relative 7 %   Monocytes Absolute 0.6 0.1 - 1.0 K/uL   Eosinophils Relative 1 %   Eosinophils Absolute 0.1 0.0 - 0.5 K/uL   Basophils Relative 1 %   Basophils Absolute 0.0 0.0 - 0.1 K/uL   Immature Granulocytes 0 %   Abs Immature Granulocytes 0.02 0.00 - 0.07 K/uL    Comment: Performed at [facility], [address], [place], [place] [address]  hCG, serum, qualitative     Status: None   Collection Time: 07/22/24  9:47 PM  Result Value Ref Range   Preg, Serum NEGATIVE NEGATIVE    Comment:        THE SENSITIVITY OF THIS METHODOLOGY IS >10 mIU/mL. Performed at [facility], [address], [place], [place] [address]     Blood alcohol level:  Lab Results  Component Value Date   ETH <10 10/24/2020   ETH <10 04/29/2019    Current Medications: Current Facility-Administered Medications  Medication Dose Route Frequency Provider Last Rate Last Admin   acetaminophen  (TYLENOL ) tablet 650 mg  650 mg Oral Q6H PRN Coleman, Carolyn H, NP       alum &  mag hydroxide-simeth (MAALOX/MYLANTA) 200-200-20 MG/5ML suspension 30 mL  30 mL Oral Q4H PRN Coleman, Carolyn H, NP       haloperidol  (HALDOL ) tablet 5 mg  5 mg Oral TID PRN Mardy Elveria DEL, NP       And   diphenhydrAMINE  (BENADRYL ) capsule 50 mg  50 mg Oral TID PRN Mardy Elveria DEL, NP       haloperidol  lactate (HALDOL ) injection 5 mg  5 mg Intramuscular TID PRN Mardy Elveria DEL, NP       And   diphenhydrAMINE  (BENADRYL ) injection 50 mg  50 mg Intramuscular TID PRN Mardy Elveria DEL, NP       And   LORazepam  (ATIVAN ) injection 2 mg  2 mg Intramuscular TID PRN Coleman, Carolyn H, NP       haloperidol  lactate (HALDOL ) injection 10 mg  10 mg Intramuscular TID PRN Mardy Elveria DEL, NP       And   diphenhydrAMINE  (BENADRYL ) injection 50 mg  50 mg Intramuscular TID PRN Mardy Elveria DEL, NP       And   LORazepam  (ATIVAN ) injection 2 mg  2 mg Intramuscular TID PRN Coleman, Carolyn H, NP       hydrOXYzine  (ATARAX ) tablet 25 mg  25 mg Oral TID PRN Coleman, Carolyn H, NP   25 mg at 07/24/24 9761   magnesium  hydroxide (MILK OF MAGNESIA) suspension 30 mL  30 mL Oral Daily PRN Coleman, Carolyn H, NP   30 mL at 07/24/24 0836   nitrofurantoin (macrocrystal-monohydrate) (MACROBID) capsule 100 mg  100 mg Oral Q12H Mannie Ashley SAILOR, MD       risperiDONE  (RISPERDAL ) tablet 1 mg  1 mg Oral BID Mannie Ashley SAILOR, MD       traZODone  (DESYREL ) tablet 50 mg  50 mg Oral QHS PRN Coleman, Carolyn H, NP   50 mg at 07/23/24 2038    PTA Medications: Medications Prior to Admission  Medication Sig Dispense Refill Last Dose/Taking   fluconazole  (DIFLUCAN ) 150 MG tablet Take 1 tablet (150 mg total) by mouth daily. (Patient not taking: Reported on 07/23/2024) 1 tablet 1    hydrOXYzine  (ATARAX ) 10 MG tablet Take 1 tablet (10 mg total) by mouth 3 (three) times daily as needed. (Patient not taking: Reported on 02/05/2024) 90 tablet 0    metroNIDAZOLE  (FLAGYL ) 500 MG tablet Take 1 tablet (500 mg total) by mouth 2 (two) times daily. (Patient not taking: Reported on 07/23/2024) 14 tablet 0    OLANZapine  (ZYPREXA ) 5 MG tablet Take 1 tablet (5 mg total) by mouth at bedtime. (Patient not taking: Reported on 02/05/2024) 30 tablet 2    traZODone  (DESYREL ) 50 MG tablet Take 1 tablet (50 mg total) by  mouth at bedtime as needed for sleep. (Patient not taking: Reported on 02/05/2024) 30 tablet 3     Mental Status Exam:  Appearance: Casually dressed, young black female with multiple facial piercings  Behavior: Calm and cooperative  Attitude: Polite, friendly  Speech: Normal rate, rhythm, tone  Mood: Fine  Affect: Congruent, appropriate to situation  Thought Process: Linear, logical, goal directed  Thought Content: Does not endorse delusional thought content.  Not observed responding to internal stimuli  SI/HI: Denies  Perceptions: Denies AVH  Judgement: Fair  Insight: Fair  Fund of Knowledge: WNL    ASSESSMENT: [name] a 28 year old female with previous psychiatric diagnoses of schizoaffective disorder, MDD with psychotic features presenting from urgent care with concerns of psychosis including paranoia, delusions, tangential speech  and disorganized thought content, low mood and anxiety related to job stress.  On initial assessment, patient does not appear acutely psychotic, and presents with clear and coherent thought process with no endorsement of perceptual disturbances, delusions or paranoia. She notes her first psychotic episode occurred during the postpartum period following the birth of her daughter, which raises suspicion for history of postpartum psychosis. Since then, patient has had mild psychotic features and episodes. She was briefly hospitalized in 2019, and has been on Risperdal  with good effect in the past. She does note history of elevated prolactin levels while on Risperdal , but denies symptoms such as galactorrhea. Although patient is not acutely psychotic, will plan to restart Risperdal  given past efficacy and risk for future psychotic episodes.   PLAN:  Psychiatric Diagnoses and Treatment  # Brief psychosis # Cannabis use disorder # Depression/ Anxiety - Start Risperdal  1 mg BID  PRN's - Trazodone  50 mg at bedtime as needed for insomnia - Atarax  25 mg TID  as needed for anxiety - Agitation Protocol: haldol  + ativan  + benadryl   2. Active Medical Issues  #UTI - Macrobid 100 mg every 12 hours for 5 day  #Nicotine withdrawal - Patient does not need nicotine replacement  Other as needed medications  Tylenol  650 mg every 6 hours as needed for pain Mylanta 30 mL every 4 hours as needed for indigestion Milk of magnesia 30 mL daily as needed for constipation  The risks/benefits/side-effects/alternatives to the above medication(s) were discussed in detail with the patient and time was given for questions. The patient consents to medication trial. FDA black box warnings, if present, were discussed.  3. Safety and Monitoring: - Involuntary admission to inpatient psychiatric unit for safety, stabilization and treatment - Daily contact with patient to assess and evaluate symptoms and progress in treatment - Patient's case to be discussed in multi-disciplinary team meeting - Observation Level: q15 minute checks - Vital signs: q12 hours - Precautions: suicide, elopement, and assault  4. Routine and other pertinent labs: EKG monitoring: QTc: 395  Metabolism / endocrine: BMI: Body mass index is 24.92 kg/m.  CBC: unremarkable CMP: unremarkable UDS: THC UA: unremarkable Pregnancy test: negative TSH: pending A1c: pending Lipid panel: pending  5.   Group Therapy: - Encouraged patient to participate in unit milieu and in scheduled group therapies  - Short Term Goals: Ability to identify changes in lifestyle to reduce recurrence of condition will improve and Ability to disclose and discuss suicidal ideas - Long Term Goals: Improvement in symptoms so as ready for discharge - [name] encouraged to participate in group therapy while admitted to the psychiatric unit. - We will address other chronic and acute stressors, which contributed to the patient's brief psychosis in order to reduce the risk of self-harm at discharge.  7.   Discharge  Planning:  - Social work and case management to assist with discharge planning and identification of hospital follow-up needs prior to discharge - Estimated LOS: 5-7 days - Discharge Concerns: Need to establish a safety plan; Medication compliance and effectiveness - Discharge Goals: Return home with outpatient referrals for mental health follow-up including medication management/psychotherapy  I certify that inpatient services furnished can reasonably be expected to improve the patient's condition.      Ashley LOISE Gravely, MD PGY-1, Psychiatry Residency 11/19/20253:42 PM
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
## 2024-07-24 NOTE — Progress Notes (Signed)
(  Sleep Hours) - 7 (Any PRNs that were needed, meds refused, or side effects to meds)- Trazodone , Hydroxyzine  25 mg (Any disturbances and when (visitation, over night)- none reported (Concerns raised by the patient)- no concerns (SI/HI/AVH)- Denies all Pt observed in the milieu.  Bright and cooperative.  Motivated for to self improve.  Complains of minor LLQ abdomen pain.  No medication needed.

## 2024-07-24 NOTE — Plan of Care (Signed)
   Problem: Education: Goal: Emotional status will improve Outcome: Progressing Goal: Mental status will improve Outcome: Progressing

## 2024-07-24 NOTE — Progress Notes (Signed)
 The nurse from [facility] called to say that the patient left some belongings in a locker (keys, wallet, bank cards) . Security was contacted and will be looking to see when the items can be sent to [facility]

## 2024-07-24 NOTE — Progress Notes (Signed)
 Tour of Duty:  Prentice JINNY Angle, RN, 07/24/24, Tour of Duty: 0700-1900  SI/HI/AVH: Denies  Self-Reported   Mood: Positive  Anxiety: Denies Depression: Denies Irritability: Denies  Broset  Violence Prevention Guidelines *See Row Information*: Small Violence Risk interventions implemented   LBM  Last BM Date : 07/23/24   Pain: not present  Patient Refusals (including Rx): No  >>Shift Summary: Patient observed to be mildly anxious on unit. Patient able to make needs known. Patient presents with several somatic concerns. Patient observed to engage appropriately with staff and peers. Patient taking medications as prescribed. This shift, no PRN medication requested or required. No reported or observed side effects to medication. Patient reports urinary frequency and urgency, abnormal UA PTA, LP aware, NO placed. No reported or observed agitation, aggression, or other acute emotional distress. No additional reported or observed physical abnormalities or concerns.  Last Vitals  Vitals Weight: 61.8 kg Temp: 98.4 F (36.9 C) Temp Source: Oral Pulse Rate: 86 Resp: 18 BP: 123/77 Patient Position: (not recorded)  Admission Type  Psych Admission Type (Psych Patients Only) Admission Status: Voluntary Date 72 hour document signed : (not recorded) Time 72 hour document signed : (not recorded) Provider Notified (First and Last Name) (see details for LINK to note): (not recorded)   Psychosocial Assessment  Psychosocial Assessment Patient Complaints: Other (Comment) (racing thoughts) Eye Contact: Fair Facial Expression: Animated, Anxious Affect: Anxious, Preoccupied Speech: Soft Interaction: Cautious Motor Activity: Other (Comment) (WDL) Appearance/Hygiene: Unremarkable Behavior Characteristics: Cooperative Mood: Depressed   Aggressive Behavior  Targets: (not recorded)   Thought Process  Thought Process Coherency: Circumstantial Content: Delusions,  Preoccupation Delusions: Somatic Perception: Derealization Hallucination: None reported or observed Judgment: Impaired Confusion: Within Defined Limits  Danger to Self/Others  Danger to Self Current suicidal ideation?: Denies Description of Suicide Plan: (not recorded) Self-Injurious Behavior: (not recorded) Agreement Not to Harm Self: (not recorded) Description of Agreement: (not recorded) Danger to Others: None reported or observed

## 2024-07-24 NOTE — [facility] Group Notes (Signed)
 Adult Psychoeducational Group Note  Date:  07/24/2024 Time:  11:21 AM  Group Topic/Focus: Recreational Therapy  Participation Level:  Active  Participation Quality:  Appropriate  Affect:  Appropriate  Cognitive:  Appropriate  Insight: Appropriate  Engagement in Group:  Engaged  Modes of Intervention:  Discussion  Additional Comments:  Pt attended the rec therapy group and remained appropriate and engaged throughout the duration of the group.   Erin Ponce O 07/24/2024, 11:21 AM

## 2024-07-24 NOTE — Progress Notes (Signed)
 Per patient's request, a letter verifying her hospitalization was faxed to her employer. [employer] (employer).  Fax:  [phone].   Youssef Footman, LCSWA 07/24/2024

## 2024-07-24 NOTE — ED Notes (Signed)
 Looking through belongings and folders in purple and found inventory sheet and yellow security form. Noticed patient has been transferred to [facility] called [facility] to verify patient did not get sent with items locked in security.  Notified them that yellow form is still here and her belongings are with security.  TAG #[id number] Yellow sheet number [id number]

## 2024-07-24 NOTE — BH IP Treatment Plan (Addendum)
 Interdisciplinary Treatment and Diagnostic Plan Update  07/24/2024 Time of Session: 10:50 AM    Erin Ponce MRN: [id number]  Principal Diagnosis: Schizoaffective disorder (HCC)  Secondary Diagnoses: Principal Problem:   Schizoaffective disorder (HCC)   Current Medications:  Current Facility-Administered Medications  Medication Dose Route Frequency Provider Last Rate Last Admin   acetaminophen  (TYLENOL ) tablet 650 mg  650 mg Oral Q6H PRN Coleman, Carolyn H, NP       alum & mag hydroxide-simeth (MAALOX/MYLANTA) 200-200-20 MG/5ML suspension 30 mL  30 mL Oral Q4H PRN Coleman, Carolyn H, NP       haloperidol  (HALDOL ) tablet 5 mg  5 mg Oral TID PRN Mardy Elveria DEL, NP       And   diphenhydrAMINE  (BENADRYL ) capsule 50 mg  50 mg Oral TID PRN Mardy Elveria DEL, NP       haloperidol  lactate (HALDOL ) injection 5 mg  5 mg Intramuscular TID PRN Mardy Elveria DEL, NP       And   diphenhydrAMINE  (BENADRYL ) injection 50 mg  50 mg Intramuscular TID PRN Mardy Elveria DEL, NP       And   LORazepam  (ATIVAN ) injection 2 mg  2 mg Intramuscular TID PRN Mardy Elveria DEL, NP       haloperidol  lactate (HALDOL ) injection 10 mg  10 mg Intramuscular TID PRN Mardy Elveria DEL, NP       And   diphenhydrAMINE  (BENADRYL ) injection 50 mg  50 mg Intramuscular TID PRN Mardy Elveria DEL, NP       And   LORazepam  (ATIVAN ) injection 2 mg  2 mg Intramuscular TID PRN Coleman, Carolyn H, NP       hydrOXYzine  (ATARAX ) tablet 25 mg  25 mg Oral TID PRN Coleman, Carolyn H, NP   25 mg at 07/24/24 0238   magnesium  hydroxide (MILK OF MAGNESIA) suspension 30 mL  30 mL Oral Daily PRN Coleman, Carolyn H, NP   30 mL at 07/24/24 0836   OLANZapine  (ZYPREXA ) tablet 5 mg  5 mg Oral BID Coleman, Carolyn H, NP   5 mg at 07/24/24 9166   traZODone  (DESYREL ) tablet 50 mg  50 mg Oral QHS PRN Coleman, Carolyn H, NP   50 mg at 07/23/24 2038   PTA Medications: Medications Prior to Admission  Medication Sig Dispense Refill Last  Dose/Taking   fluconazole  (DIFLUCAN ) 150 MG tablet Take 1 tablet (150 mg total) by mouth daily. (Patient not taking: Reported on 07/23/2024) 1 tablet 1    hydrOXYzine  (ATARAX ) 10 MG tablet Take 1 tablet (10 mg total) by mouth 3 (three) times daily as needed. (Patient not taking: Reported on 02/05/2024) 90 tablet 0    metroNIDAZOLE  (FLAGYL ) 500 MG tablet Take 1 tablet (500 mg total) by mouth 2 (two) times daily. (Patient not taking: Reported on 07/23/2024) 14 tablet 0    OLANZapine  (ZYPREXA ) 5 MG tablet Take 1 tablet (5 mg total) by mouth at bedtime. (Patient not taking: Reported on 02/05/2024) 30 tablet 2    traZODone  (DESYREL ) 50 MG tablet Take 1 tablet (50 mg total) by mouth at bedtime as needed for sleep. (Patient not taking: Reported on 02/05/2024) 30 tablet 3     Patient Stressors: Medication change or noncompliance    Patient Strengths: Supportive family/friends   Treatment Modalities: Medication Management, Group therapy, Case management,  1 to 1 session with clinician, Psychoeducation, Recreational therapy.   Physician Treatment Plan for Primary Diagnosis: Schizoaffective disorder [facility]) Long Term Goal(s):     Short Term  Goals:    Medication Management: Evaluate patient's response, side effects, and tolerance of medication regimen.  Therapeutic Interventions: 1 to 1 sessions, Unit Group sessions and Medication administration.  Evaluation of Outcomes: Not Progressing  Physician Treatment Plan for Secondary Diagnosis: Principal Problem:   Schizoaffective disorder (HCC)  Long Term Goal(s):     Short Term Goals:       Medication Management: Evaluate patient's response, side effects, and tolerance of medication regimen.  Therapeutic Interventions: 1 to 1 sessions, Unit Group sessions and Medication administration.  Evaluation of Outcomes: Not Progressing   RN Treatment Plan for Primary Diagnosis: Schizoaffective disorder (HCC) Long Term Goal(s): Knowledge of disease and  therapeutic regimen to maintain health will improve  Short Term Goals: Ability to remain free from injury will improve, Ability to verbalize frustration and anger appropriately will improve, Ability to demonstrate self-control, Ability to participate in decision making will improve, Ability to verbalize feelings will improve, Ability to disclose and discuss suicidal ideas, Ability to identify and develop effective coping behaviors will improve, and Compliance with prescribed medications will improve  Medication Management: RN will administer medications as ordered by provider, will assess and evaluate patient's response and provide education to patient for prescribed medication. RN will report any adverse and/or side effects to prescribing provider.  Therapeutic Interventions: 1 on 1 counseling sessions, Psychoeducation, Medication administration, Evaluate responses to treatment, Monitor vital signs and CBGs as ordered, Perform/monitor CIWA, COWS, AIMS and Fall Risk screenings as ordered, Perform wound care treatments as ordered.  Evaluation of Outcomes: Not Progressing   LCSW Treatment Plan for Primary Diagnosis: Schizoaffective disorder (HCC) Long Term Goal(s): Safe transition to appropriate next level of care at discharge, Engage patient in therapeutic group addressing interpersonal concerns.  Short Term Goals: Engage patient in aftercare planning with referrals and resources, Increase social support, Increase ability to appropriately verbalize feelings, Increase emotional regulation, Facilitate acceptance of mental health diagnosis and concerns, Facilitate patient progression through stages of change regarding substance use diagnoses and concerns, Identify triggers associated with mental health/substance abuse issues, and Increase skills for wellness and recovery  Therapeutic Interventions: Assess for all discharge needs, 1 to 1 time with Social worker, Explore available resources and support  systems, Assess for adequacy in community support network, Educate family and significant other(s) on suicide prevention, Complete Psychosocial Assessment, Interpersonal group therapy.  Evaluation of Outcomes: Not Progressing   Progress in Treatment: Attending groups: Yes. Participating in groups: Yes. Taking medication as prescribed: Yes. Toleration medication: Yes. Family/Significant other contact made: No, will contact:  CONSENTS PENDING Patient understands diagnosis: Yes. Discussing patient identified problems/goals with staff: Yes. Medical problems stabilized or resolved: Yes. Denies suicidal/homicidal ideation: Yes. Issues/concerns per patient self-inventory: No.   New problem(s) identified: No, Describe:  NONE  New Short Term/Long Term Goal(s): medication stabilization, elimination of SI thoughts, development of comprehensive mental wellness plan.    Patient Goals:  Control my emotions and copy by journaling.  Discharge Plan or Barriers: Patient recently admitted. CSW will continue to follow and assess for appropriate referrals and possible discharge planning.    Reason for Continuation of Hospitalization: Depression Hallucinations Medication stabilization  Estimated Length of Stay: 5 - 7 days  Last 3 [place] Suicide Severity Risk Score: Flowsheet Row [facility]Current[facility] 07/22/2024 in [facility] at [facility] Video Visit from 10/25/2023 in [facility]  C-SSRS RISK CATEGORY No Risk No Risk Error: Q7  should not be populated when Q6 is No    Last PHQ 2/9 Scores:    02/05/2024    3:04 PM 01/23/2024    3:07 PM 10/25/2023    9:18 AM  Depression screen PHQ 2/9  Decreased Interest 0 2 3  Down, Depressed, Hopeless 0 2 3  PHQ - 2 Score 0 4 6  Altered sleeping 0 3 3  Tired, decreased energy 0 2 3  Change in appetite 0 2 3  Feeling bad or  failure about yourself  0 2 3  Trouble concentrating 0 2 3  Moving slowly or fidgety/restless 0 2 3  Suicidal thoughts 0 0 3  PHQ-9 Score 0  17  27   Difficult doing work/chores   Extremely dIfficult     Data saved with a previous flowsheet row definition    Scribe for Treatment Team: Derick JONELLE Blanch, LCSW 07/24/2024 12:55 PM

## 2024-07-24 NOTE — Group Note (Signed)
 Recreation Therapy Group Note   Group Topic:Coping Skills  Group Date: 07/24/2024 Start Time: 1045 End Time: 1108 Facilitators: Shatyra Becka-McCall, LRT,CTRS Location: [address] Day[address]   Group Topic: Coping Skills   Goal Area(s) Addresses: Patient will define what a coping skill is. Patient will work with peer to create a list of healthy coping skills beginning with each letter of the alphabet. Patient will successfully identify positive coping skills they can use post d/c.  Patient will acknowledge benefit(s) of using learned coping skills post d/c.    Behavioral Response: Engaged   Intervention: Group work   Activity: Coping A to Z. Patient asked to identify what a coping skill is and when they use them. Patients with clinical research associate discussed healthy versus unhealthy coping skills. Next patients were given a blank worksheet titled Coping Skills A-Z and asked to pair up with a peer. Partners were instructed to come up with at least one positive coping skill per letter of the alphabet, addressing a specific challenge (ex: stress, anger, anxiety, depression, grief, doubt, isolation, self-harm/suicidal thoughts, substance use). Patients were given 15 minutes to brainstorm with their peer, before ideas were presented to the large group. Patients and LRT debriefed on the importance of coping skill selection based on situation and back-up plans when a skill tried is not effective. At the end of group, patients were given an handout of alphabetized strategies to keep for future reference.   Education: Pharmacologist, Scientist, Physiological, Discharge Planning.    Education Outcome: Acknowledges education/Verbalizes understanding/In group clarification offered/Additional education needed    Affect/Mood: Appropriate   Participation Level: Engaged   Participation Quality: Independent   Behavior: Appropriate   Speech/Thought Process: Focused   Insight: Good   Judgement: Good   Modes of  Intervention: Worksheet   Patient Response to Interventions:  Engaged   Education Outcome:  In group clarification offered    Clinical Observations/Individualized Feedback: Pt was bright and engaged with peers. Pt worked well with peers in coming up with their list of coping skills for isolation. Pt stated the main coping skills she uses are reading and walking. Pt went on to explain, she tends to hold onto things when faced with negative situations. Pt went on to say as a result of holding things in, she becomes emotional.     Plan: Continue to engage patient in RT group sessions 2-3x/week.   Azani Brogdon-McCall, LRT,CTRS 07/24/2024 1:10 PM

## 2024-07-25 LAB — LIPID PANEL
Cholesterol: 137 mg/dL (ref 0–200)
HDL: 37 mg/dL — ABNORMAL LOW (ref >40)
LDL Cholesterol: 91 mg/dL (ref 0–99)
Total CHOL/HDL Ratio: 3.7 ratio
Triglycerides: 47 mg/dL (ref <150)
VLDL: 9 mg/dL (ref 0–40)

## 2024-07-25 LAB — TSH: TSH: 0.688 u[IU]/mL (ref 0.350–4.500)

## 2024-07-25 LAB — HEMOGLOBIN A1C
Hgb A1c MFr Bld: 5.2 % (ref 4.8–5.6)
Mean Plasma Glucose: 102.54 mg/dL

## 2024-07-25 MED ORDER — RISPERIDONE 2 MG PO TABS
2.0000 mg | ORAL_TABLET | Freq: Every day | ORAL | Status: DC
  Administered 2024-07-26 – 2024-07-28 (×3): 2 mg via ORAL
  Filled 2024-07-25 (×3): qty 1
  Filled 2024-07-25 (×2): qty 14

## 2024-07-25 MED ORDER — MELATONIN 3 MG PO TABS: 3.0000 mg | ORAL_TABLET | Freq: Once | ORAL | Status: DC

## 2024-07-25 MED ORDER — PROPRANOLOL HCL 10 MG PO TABS
10.0000 mg | ORAL_TABLET | Freq: Two times a day (BID) | ORAL | Status: DC
  Administered 2024-07-25 – 2024-07-29 (×8): 10 mg via ORAL
  Filled 2024-07-25 (×5): qty 1
  Filled 2024-07-25 (×2): qty 14
  Filled 2024-07-25 (×3): qty 1

## 2024-07-25 MED ORDER — MELATONIN 3 MG PO TABS
3.0000 mg | ORAL_TABLET | Freq: Every day | ORAL | Status: DC
  Administered 2024-07-25 – 2024-07-26 (×2): 3 mg via ORAL
  Filled 2024-07-25 (×2): qty 1

## 2024-07-25 NOTE — Plan of Care (Signed)
   Problem: Education: Goal: Emotional status will improve Outcome: Not Progressing Goal: Mental status will improve Outcome: Not Progressing

## 2024-07-25 NOTE — Group Note (Signed)
 Date:  07/25/2024 Time:  10:06 AM  Group Topic/Focus:  Goals Group:   The focus of this group is to help patients establish daily goals to achieve during treatment and discuss how the patient can incorporate goal setting into their daily lives to aide in recovery.    Participation Level:  Active  Participation Quality:  Attentive  Affect:  Appropriate  Cognitive:  Alert  Insight: Good  Engagement in Group:  Engaged  Modes of Intervention:  Education  Additional Comments:    Erin Ponce 07/25/2024, 10:06 AM

## 2024-07-25 NOTE — Progress Notes (Addendum)
(  Sleep Hours) -4.75 (Any PRNs that were needed, meds refused, or side effects to meds)- Hydroxyzine , and Trazodone   (Any disturbances and when (visitation, over night)- (Concerns raised by the patient)- pt has increased anxiety. Pt reported she felt she was tachycardic.Writer educated pt on deep breathing exercises. Pt stated she thought she use to take risperidone   four times a day and stated it was effective.Pt reported she was having difficulty falling back asleep. On call provider called and a one time does of melatonin was ordered and administered. Pt would like to have additional sleep medication. (SI/HI/AVH)-denied

## 2024-07-25 NOTE — Plan of Care (Signed)
   Problem: Education: Goal: Knowledge of Erin Ponce General Education information/materials will improve Outcome: Progressing Goal: Emotional status will improve Outcome: Progressing Goal: Mental status will improve Outcome: Progressing Goal: Verbalization of understanding the information provided will improve Outcome: Progressing   Problem: Activity: Goal: Interest or engagement in activities will improve Outcome: Progressing Goal: Sleeping patterns will improve Outcome: Progressing   Problem: Coping: Goal: Ability to verbalize frustrations and anger appropriately will improve Outcome: Progressing Goal: Ability to demonstrate self-control will improve Outcome: Progressing

## 2024-07-25 NOTE — [facility] Group Notes (Signed)
 Patient attended the Occupational Therapy group.

## 2024-07-25 NOTE — Group Note (Signed)
 LCSW Group Therapy Note   Group Date: 07/25/2024 Start Time: 1100 End Time: 1200   Participation:  patient was present and actively participated in the discussion  Type of Therapy:  Group Therapy  Topic:  Healing Hearts:  A Safe Space for Grief  Objective:  To create a compassionate group space where participants can process grief, learn about its stages, and explore personal rituals to honor lost loved ones--using supportive elements from CBT, DBT, and group therapy.  Goals:  1. Provide a safe and supportive space where participants feel comfortable sharing their feelings and experiences of grief without judgment.  2. Educate participants about the stages of grief and emphasize that there is no right way to grieve or a fixed timeline for healing.  3. Introduce the concept of rituals to process grief, allowing individuals to honor their loved ones in a personal and meaningful way.  Summary:  In Healing Hearts: A Safe Space for Grief, participants explored the unique and personal nature of grief, with a focus on the five stages (denial, anger, bargaining, depression, acceptance) as a flexible, non-linear process. The group introduced meaningful rituals--like lighting candles or memory walks--as tools for honoring loved ones and managing difficult emotions. Through shared experiences, participants received emotional support, practiced self-care, and reflected on gratitude, emphasizing that healing has no fixed timeline.  Therapeutic Modalities Used: - Cognitive Behavioral Therapy (CBT):   Psychoeducation on grief stages, Challenging unhelpful thoughts (e.g., "I should be over this") -  Dialectical Behavior Therapy (DBT):  Mindfulness (staying present with grief-related emotions), Distress tolerance (using rituals as grounding tools) -  Group Therapy/Supportive Elements:  Emotional validation and peer support, Encouragement of open sharing in a nonjudgmental space   Erin Ponce,  LCSWA 07/25/2024  12:30 PM

## 2024-07-25 NOTE — Group Note (Signed)
 Occupational Therapy Group Note  Group Topic:Coping Skills  Group Date: 07/25/2024 Start Time: 1500 End Time: 1535 Facilitators: Dot [place] MATSU, OT   Group Description: Group encouraged increased engagement and participation through discussion and activity focused on Coping Ahead. Patients were split up into teams and selected a card from a stack of positive coping strategies. Patients were instructed to act out/charade the coping skill for other peers to guess and receive points for their team. Discussion followed with a focus on identifying additional positive coping strategies and patients shared how they were going to cope ahead over the weekend while continuing hospitalization stay.  Therapeutic Goal(s): Identify positive vs negative coping strategies. Identify coping skills to be used during hospitalization vs coping skills outside of hospital/at home Increase participation in therapeutic group environment and promote engagement in treatment   Participation Level: Engaged   Participation Quality: Independent   Behavior: Appropriate   Speech/Thought Process: Relevant   Affect/Mood: Appropriate   Insight: Fair   Judgement: Fair      Modes of Intervention: Education  Patient Response to Interventions:  Attentive   Plan: Continue to engage patient in OT groups 2 - 3x/week.  07/25/2024  [place] MATSU Dot, OT  Erin Ponce, OT

## 2024-07-25 NOTE — [facility] Group Notes (Addendum)
 [facility] Group Notes:  (Nursing/MHT/Case Management/Adjunct)  Date:  07/25/2024  Time:  9:38 PM  Type of Therapy:  Wrap-up group  Participation Level:  Attended  Participation Quality:  good  Affect:  good  Cognitive:  good  Insight:  good  Engagement in Group:  engaged  Modes of Intervention:   Summary of Progress/Problems: Goal to stay calm. Rated day 10/10.  Erin Ponce 07/25/2024, 9:38 PM

## 2024-07-25 NOTE — Progress Notes (Signed)
 [facility] Inpatient Psychiatry Progress Note  Date: 07/25/2024 Patient: Erin Ponce MRN: [id number]   ASSESSMENT: [name] a 28 year old female with previous psychiatric diagnoses of schizoaffective disorder, MDD with psychotic features presenting from urgent care with concerns of psychosis including paranoia, delusions, tangential speech and disorganized thought content, low mood and anxiety related to job stress.   11/20: Patient tolerating Risperdal  well, no medication side effects or EPS endorsed.  Thought process remains coherent and logical, no endorsement of AVH or delusional thought content. Patient reporting ongoing anxiety, impaired sleep and episodes of sudden irritability. Discussed with patient consolidating Risperdal  dose from 1 mg twice daily to 2 mg nightly to aid with sleep given its sedative effects. Will also start patient on low-dose propranolol for anxiety.  PLAN:  Psychiatric Diagnoses and Treatment   # Brief psychosis # Cannabis use disorder # Depression/ Anxiety - Consolidate Risperdal  from 1 mg BID to 2 mg nightly - Start propranolol 10 mg BID for anxiety   PRN's - Trazodone  50 mg at bedtime as needed for insomnia - Atarax  25 mg TID as needed for anxiety - Agitation Protocol: haldol  + ativan  + benadryl    2. Active Medical Issues   #UTI - Macrobid 100 mg every 12 hours for 5 days  Other as needed medications  Tylenol  650 mg every 6 hours as needed for pain Mylanta 30 mL every 4 hours as needed for indigestion Milk of magnesia 30 mL daily as needed for constipation   Risk Assessment: Patient continues to require inpatient hospitalization for safety and stabilization of mood.  Discharge Planning: Barriers to Discharge: Medication management Estimated Length of Stay: 5-7 days Predicted Discharge Location: TBD, likely home  INTERVAL HISTORY: Chart reviewed. No significant events overnight. On assessment today, the patient  reports her mood is a 8 or 9 out of 10 today. States she continues to some anxiety and irritability. Reports she was talking to family on the phone this morning and quickly became irritable and anxious and hung up the phone. Reports difficulty sleeping last night as well. Denies suicidal thoughts. No homicidal ideation. Denies auditory or visual hallucinations. Reports still having urinary frequency of approximately once per hour but denies other urinary symptoms.  PRN's in the last 24 hours include trazodone , hydroxyzine , milk of magnesia.  Physical Examination:  Vitals and nursing note reviewed MSK: Normal gait and station ROS: Denies tremors  MENTAL STATUS EXAM:  Appearance: Casually-dressed, young black female with multiple facial piercings  Behavior: Calm and cooperative  Attitude: Polite, friendly  Speech: Normal rate, rhythm, tone  Mood: 8 or 9 out of 10  Affect: Congruent, appropriate to situation  Thought Process: Linear, logical, goal directed  Thought Content: Does not endorse delusional thought content. Not observed responding to internal stimuli  SI/HI: Denies  Perceptions: Denies AVH  Judgement: Fair  Insight: Fair  Fund of Knowledge: WNL   Lab Results:  Admission on 07/23/2024  Component Date Value Ref Range Status   Cholesterol 07/25/2024 137  0 - 200 mg/dL Final   Triglycerides 88/79/7974 47  <150 mg/dL Final   HDL 88/79/7974 37 (L)  >40 mg/dL Final   Total CHOL/HDL Ratio 07/25/2024 3.7  RATIO Final   VLDL 07/25/2024 9  0 - 40 mg/dL Final   LDL Cholesterol 07/25/2024 91  0 - 99 mg/dL Final   TSH 88/79/7974 0.688  0.350 - 4.500 uIU/mL Final     Vitals: Blood pressure 123/89, pulse (!) 105, temperature 97.9 F (36.6 C), temperature  source Oral, resp. rate 18, height 5' 2 (1.575 m), weight 61.8 kg, SpO2 100%.    Ashley Gravely, MD PGY-1, Psychiatry Residency 07/25/2024, 11:07 AM

## 2024-07-25 NOTE — [facility] Group Notes (Signed)
 Patient attended the Music Therapy group.

## 2024-07-25 NOTE — Progress Notes (Signed)
   07/25/24 9177  Psych Admission Type (Psych Patients Only)  Admission Status Voluntary  Psychosocial Assessment  Patient Complaints Anxiety  Eye Contact Fair  Facial Expression Anxious;Flat  Affect Anxious;Preoccupied  Speech Soft  Interaction Cautious  Motor Activity Other (Comment) (WDL)  Appearance/Hygiene Unremarkable  Behavior Characteristics Cooperative;Anxious  Mood Anxious  Aggressive Behavior  Effect No apparent injury  Thought Process  Coherency Circumstantial  Content Preoccupation  Delusions None reported or observed  Perception WDL  Hallucination None reported or observed  Judgment Impaired  Confusion WDL  Danger to Self  Current suicidal ideation? Denies  Danger to Others  Danger to Others None reported or observed

## 2024-07-25 NOTE — Group Note (Signed)
 Recreation Therapy Group Note   Group Topic:Other  Group Date: 07/25/2024 Start Time: 1308 End Time: 1352 Facilitators: Mccabe Gloria-McCall, LRT,CTRS Location: [address] Dayroom   Activity Description/Intervention: Therapeutic Drumming. Patients with peers and staff were given the opportunity to engage in a leader facilitated [facility] Group Empowerment Drumming Circle with staff from the [employer], in partnership with [employer]. Teaching laboratory technician and trained [employer], Norleen Mon leading with LRT observing and documenting intervention and pt response. This evidenced-based practice targets 7 areas of health and wellbeing in the human experience including: stress-reduction, exercise, self-expression, camaraderie/support, nurturing, spirituality, and music-making (leisure).   Goal Area(s) Addresses:  Patient will engage in pro-social way in music group.  Patient will follow directions of drum leader on the first prompt. Patient will demonstrate no behavioral issues during group.  Patient will identify if a reduction in stress level occurs as a result of participation in therapeutic drum circle.    Education: Leisure exposure, Pharmacologist, Musical expression, Discharge Planning   Affect/Mood: Appropriate   Participation Level: Engaged   Participation Quality: Independent   Behavior: Appropriate   Speech/Thought Process: Focused   Insight: Good   Judgement: Good   Modes of Intervention: Teaching Laboratory Technician   Patient Response to Interventions:  Engaged   Education Outcome:  In group clarification offered    Clinical Observations/Individualized Feedback: Erin Ponce actively engaged in therapeutic drumming exercise and discussions. Pt was appropriate with peers, staff, and musical equipment for duration of programming.  Pt identified good as their feeling after participation in music-based programming. Pt affect congruent with verbalized  emotion.      Plan: Continue to engage patient in RT group sessions 2-3x/week.   [name], LRT,CTRS 07/25/2024 2:43 PM

## 2024-07-25 NOTE — [facility] Group Notes (Signed)
 Patient attended the Social Work group.

## 2024-07-26 DIAGNOSIS — F3281 Premenstrual dysphoric disorder: Secondary | ICD-10-CM | POA: Diagnosis present

## 2024-07-26 NOTE — [facility] Suicide Risk Assessment (Signed)
 [facility] INPATIENT:  Family/Significant Other Suicide Prevention Education  Suicide Prevention Education:  Education Completed; LaBarrius Shlomo (boyfriend) [phone],  (name of family member/significant other) has been identified by the patient as the family member/significant other with whom the patient will be residing, and identified as the person(s) who will aid the patient in the event of a mental health crisis (suicidal ideations/suicide attempt).  With written consent from the patient, the family member/significant other has been provided the following suicide prevention education, prior to the and/or following the discharge of the patient.  Plans to pick pt up Monday around 06-1029 AM. Pt will not have access to weapons or firearms at discharge. No concerns with pt discharging on Monday, reports pt has made a complete turn around and was very appreciative of the help she has received while at [facility].   The suicide prevention education provided includes the following: Suicide risk factors Suicide prevention and interventions National Suicide Hotline telephone number [facility] assessment telephone number [facility] Emergency Assistance 911 [facility] and/or Residential Mobile Crisis Unit telephone number  Request made of family/significant other to: Remove weapons (e.g., guns, rifles, knives), all items previously/currently identified as safety concern.   Remove drugs/medications (over-the-counter, prescriptions, illicit drugs), all items previously/currently identified as a safety concern.  The family member/significant other verbalizes understanding of the suicide prevention education information provided.  The family member/significant other agrees to remove the items of safety concern listed above.  Jenkins LULLA Primer 07/26/2024, 3:38 PM

## 2024-07-26 NOTE — Plan of Care (Signed)
  Problem: Activity: Goal: Interest or engagement in activities will improve Outcome: Progressing   Problem: Health Behavior/Discharge Planning: Goal: Compliance with treatment plan for underlying cause of condition will improve Outcome: Progressing   Problem: Activity: Goal: Sleeping patterns will improve Outcome: Not Progressing

## 2024-07-26 NOTE — Plan of Care (Signed)
   Problem: Education: Goal: Knowledge of [place] [facility] Education information/materials will improve Outcome: Progressing Goal: Emotional status will improve Outcome: Progressing Goal: Mental status will improve Outcome: Progressing

## 2024-07-26 NOTE — Group Note (Signed)
 Date:  07/26/2024 Time:  3:41 PM  Group Topic/Focus: Sleep Hygiene Dimensions of Wellness:   The focus of this group is to introduce the topic of wellness and discuss the role each dimension of wellness plays in total health.    Participation Level:  Active  Participation Quality:  Appropriate  Affect:  Appropriate  Cognitive:  Appropriate  Insight: Appropriate  Engagement in Group:  Engaged  Modes of Intervention:  Discussion  Additional Comments:  Pt engaged appropriately during group.  Appollonia Klee D Garlen Reinig 07/26/2024, 3:41 PM

## 2024-07-26 NOTE — Progress Notes (Signed)
(  Sleep Hours) -5.25  (Any PRNs that were needed, meds refused, or side effects to meds)- hydroxyzine  25mg ; Trazodone  50mg   (Any disturbances and when (visitation, over night)-none  (Concerns raised by the patient)- none  (SI/HI/AVH)-denies

## 2024-07-26 NOTE — [facility] Group Notes (Signed)
 Adult Psychoeducational Group Note  Date:  07/26/2024 Time:  12:10 PM  Group Topic/Focus: Recreational Therapy   Participation Level:  Attended  Erin Ponce 07/26/2024, 12:10 PM

## 2024-07-26 NOTE — [facility] Group Notes (Signed)
 [facility] Group Notes:  (Nursing/MHT/Case Management/Adjunct)  Date:  07/26/2024  Time:  8:25 PM  Type of Therapy:  AA Group  Participation Level:  Active  Participation Quality:  Appropriate  Affect:  Appropriate  Cognitive:  Appropriate  Insight:  Appropriate  Engagement in Group:  Engaged  Modes of Intervention:  Education  Summary of Progress/Problems:Attended AA meeting.  Grayce LITTIE Essex 07/26/2024, 8:25 PM

## 2024-07-26 NOTE — Group Note (Signed)
 Recreation Therapy Group Note   Group Topic:Problem Solving  Group Date: 07/26/2024 Start Time: 1005 End Time: 1030 Facilitators: Deitrick Ferreri-McCall, LRT,CTRS Location: [address] Dayroom   Group Topic: Communication, Team Building, Problem Solving  Goal Area(s) Addresses:  Patient will effectively work with peer towards shared goal.  Patient will identify skills used to make activity successful.  Patient will identify how skills used during activity can be applied to reach post d/c goals.   Behavioral Response: Engaged  Intervention: STEM Activity- Glass Blower/designer  Activity: Tallest [employer]. In teams of 5-6, patients were given 11 craft pipe cleaners. Using the materials provided, patients were instructed to compete again the opposing team(s) to build the tallest free-standing structure from floor level. The activity was timed; difficulty increased by clinical research associate as production designer, theatre/television/film continued.  Systematically resources were removed with additional directions for example, placing one arm behind their back, working in silence, and shape stipulations. LRT facilitated post-activity discussion reviewing team processes and necessary communication skills involved in completion. Patients were encouraged to reflect how the skills utilized, or not utilized, in this activity can be incorporated to positively impact support systems post discharge.  Education: Pharmacist, Community, Scientist, Physiological, Discharge Planning   Education Outcome: Acknowledges education/In group clarification offered/Needs additional education.    Affect/Mood: Appropriate   Participation Level: Engaged   Participation Quality: Independent   Behavior: Appropriate   Speech/Thought Process: Focused   Insight: Good   Judgement: Good   Modes of Intervention: STEM Activity   Patient Response to Interventions:  Engaged   Education Outcome:  In group clarification offered    Clinical  Observations/Individualized Feedback: Pt was bright and engaged. Pt was focused on completing with the tower. Pt worked well with peers in creating and constructing their tower. Pt also struggled with only being able to use one hand but was able to work through it.      Plan: Continue to engage patient in RT group sessions 2-3x/week.   Chelsei Mcchesney-McCall, LRT,CTRS 07/26/2024 12:12 PM

## 2024-07-26 NOTE — [facility] Group Notes (Signed)
 Adult Psychoeducational Group Note  Date:  07/26/2024 Time:  12:34 PM  Group Topic/Focus:  Managing Feelings:   The focus of this group is to identify what feelings patients have difficulty handling and develop a plan to handle them in a healthier way upon discharge. Wellness Toolbox:   The focus of this group is to discuss various aspects of wellness, balancing those aspects and exploring ways to increase the ability to experience wellness.  Patients will create a wellness toolbox for use upon discharge.  Participation Level:  Active  Participation Quality:  Attentive  Affect:  Appropriate  Cognitive:  Alert  Insight: Appropriate  Engagement in Group:  Engaged  Modes of Intervention:  Activity and Discussion  Additional Comments:  Patient attended and participated in the Social Wellness group.  Erin Ponce 07/26/2024, 12:34 PM

## 2024-07-26 NOTE — Progress Notes (Signed)
(  Sleep Hours) -4.25  (Any PRNs that were needed, meds refused, or side effects to meds)- Trazodone  50mg ; hydroxyzine  25mg   (Any disturbances and when (visitation, over night)-none  (Concerns raised by the patient)- none  (SI/HI/AVH)-denies

## 2024-07-26 NOTE — [facility] Group Notes (Signed)
 Adult Psychoeducational Group Note  Date:  07/26/2024 Time:  10:57 AM  Group Topic/Focus: Goals group Goals Group:   The focus of this group is to help patients establish daily goals to achieve during treatment and discuss how the patient can incorporate goal setting into their daily lives to aide in recovery.  Participation Level:  Active  Participation Quality:  Appropriate  Affect:  Appropriate  Cognitive:  Appropriate  Insight: Appropriate  Engagement in Group:  Engaged  Modes of Intervention:  Discussion  Additional Comments:  The patient engaged in group..  Lyna Laningham Lee 07/26/2024, 10:57 AM

## 2024-07-26 NOTE — Progress Notes (Signed)
 [facility] Progress Note  Date: 07/26/2024 Patient: Abbigaile Rockman MRN: [id number]   ASSESSMENT: [name] a 28 year old female with previous psychiatric diagnoses of schizoaffective disorder, MDD with psychotic features presenting from urgent care with concerns of psychosis including paranoia, delusions, tangential speech and disorganized thought content, low mood and anxiety related to job stress.   11/21: Current mood is stable and "great," with no acute depressive or psychotic symptoms noted today. Patient denies SI/HI and denies AVH. Patient reports a two-year history of cyclical mood symptoms of depression, anxiety, and irritability occurring regularly in the days prior to menstruation and resolving with onset of menses. Symptoms appear to cause significant emotional distress and functional impact, and are suggestive of a diagnosis of PMDD. Discussed PMDD treatment options and advised outpatient OB/GYN or PCP follow-up to consider initiating an SSRI such as fluoxetine for use in the premenstrual week.   PLAN:  Psychiatric Diagnoses and Treatment   # Brief psychosis # Cannabis use disorder # Depression/Anxiety - Continue Risperdal  2 mg nightly - Continue propranolol  10 mg BID for anxiety   PRN's - Trazodone  50 mg at bedtime as needed for insomnia - Atarax  25 mg TID as needed for anxiety - Agitation Protocol: haldol  + ativan  + benadryl    2. Active Medical Issues   #UTI - Macrobid  100 mg every 12 hours for 5 days  Other as needed medications  Tylenol  650 mg every 6 hours as needed for pain Mylanta 30 mL every 4 hours as needed for indigestion Milk of magnesia 30 mL daily as needed for constipation   Risk Assessment: Patient continues to require inpatient hospitalization for safety and stabilization of mood.  Discharge Planning: Barriers to Discharge: Medication management Estimated Length of Stay: 5-7 days Predicted Discharge  Location: TBD, likely home  INTERVAL HISTORY: Chart reviewed. No significant events overnight. On assessment today, patient describes her overall mood as great. Reports she feels much calmer and less irritable. Denies SI/HI. Denies AVH. Sleep is fair. Reports she just started her period this morning, and notes that she often feels an intense sense of dysphoria, anxiety, irritability, headache, fatigue in the days before her period begins. Reports that these pre-menstrual symptoms have been going on for the last two years, but she only recently noticed the relationship in timing between her period and worsening mood symptoms. Spoke with patient about differences between PMS vs PMDD, and she felt her severity of mood symptoms was more representative of PMDD.   PRN's in the last 24 hours include trazodone , hydroxyzine .  Physical Examination:  Vitals and nursing note reviewed MSK: Normal gait and station ROS: Denies tremors, muscle rigidity  MENTAL STATUS EXAM:  Appearance: Casually-dressed, young black female with multiple facial piercings  Behavior: Calm and cooperative  Attitude: Polite, friendly  Speech: Normal rate, rhythm, tone  Mood: Great  Affect: Congruent, appropriate to situation  Thought Process: Linear, logical, goal directed  Thought Content: Does not endorse delusional thought content. Not observed responding to internal stimuli  SI/HI: Denies  Perceptions: Denies AVH  Judgement: Fair  Insight: Fair  Fund of Knowledge: WNL   Lab Results:  Admission on 07/23/2024  Component Date Value Ref Range Status   Hgb A1c MFr Bld 07/25/2024 5.2  4.8 - 5.6 % Final   Mean Plasma Glucose 07/25/2024 102.54  mg/dL Final   Cholesterol 88/79/7974 137  0 - 200 mg/dL Final   Triglycerides 88/79/7974 47  <150 mg/dL Final   HDL 88/79/7974 37 (L)  >  40 mg/dL Final   Total CHOL/HDL Ratio 07/25/2024 3.7  RATIO Final   VLDL 07/25/2024 9  0 - 40 mg/dL Final   LDL Cholesterol 07/25/2024 91  0  - 99 mg/dL Final   TSH 88/79/7974 0.688  0.350 - 4.500 uIU/mL Final     Vitals: Blood pressure (!) 145/93, pulse 99, temperature 98.4 F (36.9 C), temperature source Oral, resp. rate 18, height 5' 2 (1.575 m), weight 61.8 kg, SpO2 100%.    Ashley Gravely, MD PGY-1, Psychiatry Residency 07/26/2024, 8:40 AM

## 2024-07-27 MED ORDER — MELATONIN 5 MG PO TABS
10.0000 mg | ORAL_TABLET | Freq: Every day | ORAL | Status: DC
  Administered 2024-07-27 – 2024-07-28 (×2): 10 mg via ORAL
  Filled 2024-07-27: qty 2
  Filled 2024-07-27: qty 14
  Filled 2024-07-27: qty 2

## 2024-07-27 NOTE — [facility] Group Notes (Signed)
 Erin Ponce attended the social work group today 07/27/24 (1000-1100).

## 2024-07-27 NOTE — Progress Notes (Signed)
 [facility] Inpatient Psychiatry Progress Note  Date: 07/27/2024 Patient: Erin Ponce MRN: [id number]   ASSESSMENT: [name] a 28 year old female with previous psychiatric diagnoses of schizoaffective disorder, MDD with psychotic features presenting from urgent care with concerns of psychosis including paranoia, delusions, tangential speech and disorganized thought content, low mood and anxiety related to job stress.   11/21: Patient doing well overall, with stable and improved mood, and no acute depressive or psychotic symptoms noted.  Has not had much improvement in sleep on current medication regimen, therefore will increase melatonin to 10 mg nightly for sleep cycle regulation. Tentative date for discharge is Monday.   PLAN:  Psychiatric Diagnoses and Treatment   # Brief psychosis # Cannabis use disorder # Depression/Anxiety - Continue Risperdal  2 mg nightly - Continue propranolol  10 mg BID for anxiety - Increase melatonin to 10 mg at bedtime   PRN's - Trazodone  50 mg at bedtime as needed for insomnia - Atarax  25 mg TID as needed for anxiety - Agitation Protocol: haldol  + ativan  + benadryl    2. Active Medical Issues   #UTI - Macrobid  100 mg every 12 hours for 5 days  Other as needed medications  Tylenol  650 mg every 6 hours as needed for pain Mylanta 30 mL every 4 hours as needed for indigestion Milk of magnesia 30 mL daily as needed for constipation   Risk Assessment: Patient continues to require inpatient hospitalization for safety and stabilization of mood.  Discharge Planning: Barriers to Discharge: Medication management Estimated Length of Stay: 5-7 days Predicted Discharge Location: TBD, likely home  INTERVAL HISTORY: Chart reviewed. No significant events overnight. Patient with overall good mood. Still having issues with sleep, but says this may be due to history of night-shift work, which has impacted her sleep cycle. Denies SI.  Denies AVH. Denies medication side effects.  PRN's in the last 24 hours include trazodone , hydroxyzine .  Physical Examination:  Vitals and nursing note reviewed MSK: Normal gait and station ROS: Denies tremors, muscle rigidity  MENTAL STATUS EXAM:  Appearance: Casually-dressed, young black female with multiple facial piercings  Behavior: Calm and cooperative  Attitude: Polite, friendly  Speech: Normal rate, rhythm, tone  Mood: Great  Affect: Congruent, appropriate to situation  Thought Process: Linear, logical, goal directed  Thought Content: Does not endorse delusional thought content. Not observed responding to internal stimuli  SI/HI: Denies  Perceptions: Denies AVH  Judgement: Fair  Insight: Fair  Fund of Knowledge: WNL   Lab Results:  Admission on 07/23/2024  Component Date Value Ref Range Status   Hgb A1c MFr Bld 07/25/2024 5.2  4.8 - 5.6 % Final   Mean Plasma Glucose 07/25/2024 102.54  mg/dL Final   Cholesterol 88/79/7974 137  0 - 200 mg/dL Final   Triglycerides 88/79/7974 47  <150 mg/dL Final   HDL 88/79/7974 37 (L)  >40 mg/dL Final   Total CHOL/HDL Ratio 07/25/2024 3.7  RATIO Final   VLDL 07/25/2024 9  0 - 40 mg/dL Final   LDL Cholesterol 07/25/2024 91  0 - 99 mg/dL Final   TSH 88/79/7974 0.688  0.350 - 4.500 uIU/mL Final     Vitals: Blood pressure 117/88, pulse (!) 102, temperature 98.6 F (37 C), temperature source Oral, resp. rate 16, height 5' 2 (1.575 m), weight 61.8 kg, SpO2 100%.    Ashley Gravely, MD PGY-1, Psychiatry Residency 07/27/2024, 12:01 PM

## 2024-07-27 NOTE — Progress Notes (Signed)
 Pt denies SI/HI/AVH. Pt reports poor sleep last PM. Pt states she woke up with blurry  vision but it has since resolved. Pt did states she works overnight and believes that attributes to her difficulty sleeping while hospitalized.

## 2024-07-27 NOTE — Group Note (Signed)
 [facility] LCSW Group Therapy Note   Group Date: 07/27/2024 Start Time: 1000 End Time: 1100   Type of Therapy/Topic:  Group Therapy:  Emotion Regulation/stress reduction  Participation Level:  Did Not Attend   Mood:  Description of Group:    The purpose of this group is to assist patients in learning to regulate negative emotions and experience positive emotions. Patients will be guided to discuss ways in which they have been vulnerable to their negative emotions. These vulnerabilities will be juxtaposed with experiences of positive emotions or situations, and patients challenged to use positive emotions to combat negative ones. Special emphasis will be placed on coping with negative emotions in conflict situations, and patients will process healthy conflict resolution skills.  Therapeutic Goals: Patient will identify two positive emotions or experiences to reflect on in order to balance out negative emotions:  Patient will label two or more emotions that they find the most difficult to experience:  Patient will be able to demonstrate positive conflict resolution skills through discussion or role plays:   Summary of Patient Progress:   Pt was invited but did not attend    Therapeutic Modalities:   Cognitive Behavioral Therapy Feelings Identification Dialectical Behavioral Therapy   Golda Louder, LCSW

## 2024-07-27 NOTE — Plan of Care (Signed)

## 2024-07-27 NOTE — Group Note (Signed)
 Date:  07/27/2024 Time:  2:10 PM  Group Topic/Focus: Physical Wellness  Patients learned the mechanisms and benefits of deep-breathing techniques and practiced guided breathing exercises. The group engaged in stretching routines and followed along with music-based movement activities to encourage circulation and promote overall physical activation. The session concluded with participants seated and performing chair-based yoga to support relaxation and gentle mobility    Participation Level:  Active  Participation Quality:  Appropriate, Attentive, and Supportive  Affect:  Appropriate  Cognitive:  Alert and Appropriate  Insight: Good and Improving  Engagement in Group:  Engaged, Improving, and Supportive  Modes of Intervention:  Activity, Socialization, and Support  Additional Comments:  Erin Ponce attended and actively participated in this physical wellness group.  [name] 07/27/2024, 2:10 PM

## 2024-07-27 NOTE — [facility] Group Notes (Signed)
 Adult Psychoeducational Group Note  Date:  07/27/2024 Time:  8:37 PM  Group Topic/Focus:  Wrap-Up Group:   The focus of this group is to help patients review their daily goal of treatment and discuss progress on daily workbooks.  Participation Level:  Active  Participation Quality:  Appropriate  Affect:  Appropriate  Cognitive:  Appropriate  Insight: Appropriate  Engagement in Group:  Engaged  Modes of Intervention:  Discussion  Additional Comments:  Jelina said her goal fomforting. Describe her day here existence.  Lang Drilling Long 07/27/2024, 8:37 PM

## 2024-07-27 NOTE — Group Note (Signed)
 Date:  07/27/2024 Time:  10:21 AM  Group Topic/Focus: Goals Group  Goals Group focused on self-reflection and future planning. The group began with an icebreaker in which members shared their favorite foods and identified personal strengths using a provided list of positive traits. Patients then discussed their short- and long-term goals, including 1-week, 47-month, 1-year, and 5-year objectives. Participants identified potential obstacles to achieving these goals and explored practical steps they could take if such barriers arise.   Participation Level:  Active  Participation Quality:  Appropriate, Attentive, Resistant, and Sharing  Affect:  Flat  Cognitive:  Alert and Appropriate  Insight: Good and Improving  Engagement in Group:  Engaged and Improving  Modes of Intervention:  Discussion, Exploration, and Socialization  Additional Comments:  Erin Ponce attended and actively participated in goals group. Initially she declined to share her icebreaker responses or goals, but accepted a second invitation near the end of the session and shared with the group.  Kristi HERO Phebe Dettmer 07/27/2024, 10:21 AM

## 2024-07-27 NOTE — Progress Notes (Signed)
   07/27/24 2121  Psych Admission Type (Psych Patients Only)  Admission Status Voluntary  Psychosocial Assessment  Patient Complaints Sleep disturbance  Eye Contact Fair  Facial Expression Animated  Affect Appropriate to circumstance  Speech Logical/coherent  Interaction Assertive  Motor Activity Other (Comment) (WDL)  Appearance/Hygiene Unremarkable  Behavior Characteristics Appropriate to situation  Mood Pleasant  Thought Process  Coherency Circumstantial  Content Preoccupation  Delusions None reported or observed  Perception WDL  Hallucination None reported or observed  Judgment Impaired  Confusion None  Danger to Self  Current suicidal ideation? Denies  Agreement Not to Harm Self Yes  Description of Agreement verbal  Danger to Others  Danger to Others None reported or observed

## 2024-07-27 NOTE — Group Note (Signed)
 Date:  07/27/2024 Time:  10:36 AM  Group Topic/Focus: Social Wellness  Patients participated in a Social Wellness Group focused on themes of control and shared experience. The group engaged in a collaborative drawing activity in which they added to each other's drawings every two minutes and later shared the final results. Discussion centered on the feelings associated with having control, losing control, and sharing control. Patients were educated on the normalcy of experiencing discomfort when control is lost, and the group explored ways to reframe these experiences positively, using the activity as an example. Patients were engaged and demonstrated insight during the discussion.    Participation Level:  Active  Participation Quality:  Appropriate, Attentive, and Sharing  Affect:  Appropriate  Cognitive:  Alert and Appropriate  Insight: Good and Improving  Engagement in Group:  Engaged and Improving  Modes of Intervention:  Activity, Discussion, Exploration, and Support  Additional Comments:  [name] attended and actively participate in this social wellness group.  Erin Ponce 07/27/2024, 10:36 AM

## 2024-07-27 NOTE — Group Note (Signed)
 Date:  07/27/2024 Time:  5:57 PM  Group Topic/Focus:  Patients created intuitive collages, supporting mindfulness and focus, using the creative process to organize and visually express complex feelings that may be hard to verbalize. The format encouraged interpersonal connection, personal sharing, and peer support, which can foster social engagement and enhance emotional safety.    Participation Level:  Active  Participation Quality:  Appropriate and Attentive  Affect:  Appropriate  Cognitive:  Alert and Appropriate  Insight: Good and Improving  Engagement in Group:  Engaged and Improving  Modes of Intervention:  Activity, Discussion, Exploration, Rapport Building, Socialization, and Support    Berwyn GORMAN Acosta 07/27/2024, 5:57 PM

## 2024-07-27 NOTE — Group Note (Signed)
 Date:  07/27/2024 Time:  2:21 PM  Group Topic/Focus: Karaoke  Patients participated in a karaoke-based therapeutic recreation group, singing both individually and together. The activity promoted peer support, confidence-building, social engagement, and positive group cohesion, with participants encouraging one another throughout the session.    Participation Level:  Active  Participation Quality:  Appropriate, Attentive, and Supportive  Affect:  Appropriate  Cognitive:  Alert and Appropriate  Insight: Good and Improving  Engagement in Group:  Engaged, Improving, and Supportive  Modes of Intervention:  Activity, Socialization, and Support  Additional Comments:  Dezarae attended and actively participated in [place].  Kristi HERO Najai Waszak 07/27/2024, 2:21 PM

## 2024-07-28 MED ORDER — LIDOCAINE 5 % EX PTCH
2.0000 | MEDICATED_PATCH | CUTANEOUS | Status: DC
  Administered 2024-07-28 – 2024-07-29 (×2): 2 via TRANSDERMAL
  Filled 2024-07-28: qty 14
  Filled 2024-07-28 (×2): qty 2
  Filled 2024-07-28: qty 14

## 2024-07-28 MED ORDER — TRAZODONE HCL 50 MG PO TABS
50.0000 mg | ORAL_TABLET | Freq: Once | ORAL | Status: AC
  Administered 2024-07-29: 50 mg via ORAL
  Filled 2024-07-28: qty 1

## 2024-07-28 NOTE — Progress Notes (Signed)
 [facility] Inpatient Psychiatry Progress Note  Date: 07/28/2024 Patient: Erin Ponce MRN: [id number]   ASSESSMENT: [name] a 28 year old female with previous psychiatric diagnoses of schizoaffective disorder, MDD with psychotic features presenting from urgent care with concerns of psychosis including paranoia, delusions, tangential speech and disorganized thought content, low mood and anxiety related to job stress.   11/21: Patient has progressed very well since admission. Mood is stable and no acute depressive or psychotic symptoms noted, although patient continues to have sleep dysregulation despite changes in medication regimen. Patient expresses interest in following up with outpatient provider for management of PMDD. Will plan for discharge Monday 11/24.  Ordered lidocaine  patch for shoulder pain.  PLAN:  Psychiatric Diagnoses and Treatment   # Brief psychosis # Cannabis use disorder # Depression/Anxiety - Continue Risperdal  2 mg nightly - Continue propranolol  10 mg BID for anxiety - Continue melatonin 10 mg at bedtime   PRN's - Trazodone  50 mg at bedtime as needed for insomnia - Atarax  25 mg TID as needed for anxiety - Agitation Protocol: haldol  + ativan  + benadryl    2. Active Medical Issues   #UTI - Macrobid  100 mg every 12 hours for 5 days  #Shoulder pain - Lidocaine  patch  Other as needed medications  Tylenol  650 mg every 6 hours as needed for pain Mylanta 30 mL every 4 hours as needed for indigestion Milk of magnesia 30 mL daily as needed for constipation   Risk Assessment: Patient continues to require inpatient hospitalization for safety and stabilization of mood.  Discharge Planning: Barriers to Discharge: Medication management Estimated Length of Stay: 5-7 days Predicted Discharge Location: home  INTERVAL HISTORY: Chart reviewed. No significant events overnight. Patient seen in good spirits. Denies auditory or visual  hallucinations. Denies SI/HI. Still endorses sleep dysregulation. Denies medication side effects. Attending groups regularly. Reports she injured her shoulder playing basketball the other day.  Tylenol  is helping but still some residual pain.  PRN's in the last 24 hours include trazodone , hydroxyzine .  Physical Examination:  Vitals and nursing note reviewed MSK: Normal gait and station ROS: Denies tremors, muscle rigidity  MENTAL STATUS EXAM:  Appearance: Casually-dressed, young black female with multiple facial piercings  Behavior: Calm and cooperative  Attitude: Polite, friendly  Speech: Normal rate, rhythm, tone  Mood: Great  Affect: Congruent, appropriate to situation  Thought Process: Linear, logical, goal directed  Thought Content: Does not endorse delusional thought content. Not observed responding to internal stimuli  SI/HI: Denies  Perceptions: Denies AVH  Judgement: Good  Insight: Good  Fund of Knowledge: WNL   Lab Results:  Admission on 07/23/2024  Component Date Value Ref Range Status   Hgb A1c MFr Bld 07/25/2024 5.2  4.8 - 5.6 % Final   Mean Plasma Glucose 07/25/2024 102.54  mg/dL Final   Cholesterol 88/79/7974 137  0 - 200 mg/dL Final   Triglycerides 88/79/7974 47  <150 mg/dL Final   HDL 88/79/7974 37 (L)  >40 mg/dL Final   Total CHOL/HDL Ratio 07/25/2024 3.7  RATIO Final   VLDL 07/25/2024 9  0 - 40 mg/dL Final   LDL Cholesterol 07/25/2024 91  0 - 99 mg/dL Final   TSH 88/79/7974 0.688  0.350 - 4.500 uIU/mL Final     Vitals: Blood pressure (!) 118/97, pulse 94, temperature 97.7 F (36.5 C), temperature source Oral, resp. rate 16, height 5' 2 (1.575 m), weight 61.8 kg, SpO2 100%.    Ashley Gravely, MD PGY-1, Psychiatry Residency 07/28/2024, 10:07 AM

## 2024-07-28 NOTE — Progress Notes (Signed)
   07/28/24 1944  Psych Admission Type (Psych Patients Only)  Admission Status Voluntary  Psychosocial Assessment  Patient Complaints None  Eye Contact Fair  Facial Expression Animated  Affect Appropriate to circumstance  Speech Logical/coherent  Interaction Assertive  Motor Activity Other (Comment) (WDL)  Appearance/Hygiene Unremarkable  Behavior Characteristics Appropriate to situation  Mood Anxious  Thought Process  Coherency WDL  Content WDL  Delusions None reported or observed  Perception WDL  Hallucination None reported or observed  Judgment WDL  Confusion None  Danger to Self  Current suicidal ideation? Denies  Agreement Not to Harm Self Yes  Description of Agreement verbal  Danger to Others  Danger to Others None reported or observed

## 2024-07-28 NOTE — Group Note (Signed)
 Date:  07/28/2024 Time:  10:07 AM  Group Topic/Focus:  Goals Group:  Participants assessed their current level of intellectual wellness, identify areas for growth, and create personalized, achievable goals that foster lifelong learning, critical thinking, creativity, and engagement with thought-provoking activities. By the end of the session, each participant will have a clear, actionable SMART goal aimed at enhancing their intellectual well-being, with a plan for ongoing reflection and accountability to ensure progress.  Participation Level:  Active  Participation Quality:  Appropriate and Sharing  Affect:  Appropriate  Cognitive:  Appropriate  Insight: Appropriate  Engagement in Group:  Improving  Modes of Intervention:  Activity and Discussion  Additional Comments:  N/A  Lauris JONELLE Morales 07/28/2024, 10:07 AM

## 2024-07-28 NOTE — Group Note (Signed)
 Date:  07/28/2024 Time:  8:43 PM  Group Topic/Focus:  Addiction - Ted Talk on Addiction/[place]    Participation Level:  Active  Participation Quality:  Appropriate  Affect:  Appropriate  Cognitive:  Appropriate  Insight: Appropriate  Engagement in Group:  Engaged  Modes of Intervention:  Discussion and Education  Additional Comments:    Juliene CHRISTELLA Huddle 07/28/2024, 8:43 PM

## 2024-07-28 NOTE — [facility] Group Notes (Signed)
 Adult Psychoeducational Group Note  Date:  07/28/2024 Time:  8:33 PM  Group Topic/Focus:  Wrap-Up Group:   The focus of this group is to help patients review their daily goal of treatment and discuss progress on daily workbooks.  Participation Level:  Active  Participation Quality:  Appropriate  Affect:  Appropriate  Cognitive:  Appropriate  Insight: Appropriate  Engagement in Group:  Engaged  Modes of Intervention:  Discussion  Additional Comments:     Erin Ponce 07/28/2024, 8:33 PM

## 2024-07-28 NOTE — Progress Notes (Signed)
(  Sleep Hours) - 6.25 hours (Any PRNs that were needed, meds refused, or side effects to meds)- Vistaril , Trazodone , Tylenol  given (Any disturbances and when (visitation, over night)- Sleep disturbance, Tylenol  for cramps related to period (Concerns raised by the patient)-  None (SI/HI/AVH)-  Denies

## 2024-07-28 NOTE — Plan of Care (Signed)
  Problem: Education: Goal: Emotional status will improve Outcome: Progressing   Problem: Education: Goal: Verbalization of understanding the information provided will improve Outcome: Progressing

## 2024-07-28 NOTE — Group Note (Signed)
 Date:  07/28/2024 Time:  10:21 AM  Group Topic/Focus:  Emotional Wellness: Focused on developing emotional awareness and intelligence by exploring the Feelings Wheel and reflecting on insights from a TEDx talk about emotional well-being. Our aim was to deepen our understanding of emotions, improve our ability to identify and express feelings, and explore practical strategies for managing emotions in a healthy way.  Participation Level:  Active  Participation Quality:  Appropriate and Attentive  Affect:  Appropriate  Cognitive:  Appropriate  Insight: Appropriate  Engagement in Group:  Engaged  Modes of Intervention:  Education  Additional Comments:  N/A  Lauris JONELLE Morales 07/28/2024, 10:21 AM

## 2024-07-28 NOTE — Group Note (Signed)
 Date:  07/28/2024 Time:  11:53 AM  Group Topic/Focus:  Physical Wellness: Develop a deeper sense of awareness and connection with their bodies through guided meditation and grounding techniques. By engaging in these practices, the goal is to reduce stress, promote relaxation, and enhance overall well-being. Participants will learn tools they can incorporate into their daily lives to manage physical tension, emotional stress, and increase mindfulness. The session aims to foster a supportive environment where individuals can share experiences, gain confidence in using mindfulness techniques, and improve their ability to remain grounded in the present moment.  Participation Level:  Active  Participation Quality:  Appropriate and Attentive  Affect:  Appropriate  Cognitive:  Appropriate  Insight: Appropriate  Engagement in Group:  Engaged  Modes of Intervention:  Activity  Additional Comments:  N/A  Erin Ponce 07/28/2024, 11:53 AM

## 2024-07-28 NOTE — Plan of Care (Signed)
 Pt was out in the milieu during the day acting appropriately. Went to [employer] and ate adequately. Attending group activity and participated. Denies SI/HI/SH/paranoia/AVH. Will continue to monitor.

## 2024-07-29 LAB — PROLACTIN: Prolactin: 124 ng/mL — ABNORMAL HIGH (ref 4.8–33.4)

## 2024-07-29 MED ORDER — MELATONIN 10 MG PO TABS: 10.0000 mg | ORAL_TABLET | Freq: Every day | ORAL | Status: DC

## 2024-07-29 MED ORDER — WHITE PETROLATUM EX OINT
TOPICAL_OINTMENT | CUTANEOUS | Status: AC
  Filled 2024-07-29: qty 5

## 2024-07-29 MED ORDER — RISPERIDONE 2 MG PO TABS
2.0000 mg | ORAL_TABLET | Freq: Every day | ORAL | 0 refills | Status: AC
Start: 2024-07-29 — End: ?

## 2024-07-29 MED ORDER — LIDOCAINE 5 % EX PTCH: 1.0000 | MEDICATED_PATCH | CUTANEOUS | 0 refills | Status: AC

## 2024-07-29 MED ORDER — PROPRANOLOL HCL 10 MG PO TABS: 10.0000 mg | ORAL_TABLET | Freq: Two times a day (BID) | ORAL | 0 refills | Status: DC

## 2024-07-29 MED ORDER — TRAZODONE HCL 50 MG PO TABS: 50.0000 mg | ORAL_TABLET | Freq: Every evening | ORAL | 3 refills | Status: DC | PRN

## 2024-07-29 NOTE — Plan of Care (Signed)

## 2024-07-29 NOTE — Progress Notes (Signed)
(  Sleep Hours) - 6.5 hours (Any PRNs that were needed, meds refused, or side effects to meds)- Trazodone  given x2 (Any disturbances and when (visitation, over night)- Up twice due to new roommate coming in (Concerns raised by the patient)- None - Pt excited for discharge today (SI/HI/AVH)- Denies

## 2024-07-29 NOTE — Progress Notes (Signed)
  [facility] Adult Case Management Discharge Plan :  Will you be returning to the same living situation after discharge:  Yes,  Pt will return to boyfriend's home. At discharge, do you have transportation home?: Yes,  Boyfriend will pick up around 10/10:30 am. Do you have the ability to pay for your medications: No.  Samples provided from pharmacy at discharge.  Release of information consent forms completed and in the chart;  Patient's signature needed at discharge.  Patient to Follow up at:  Follow-up Information     [facility]. Go on 08/13/2024.   Specialty: Behavioral Health Why: Please go to this provider on 08/13/24 at 7:00 am for an assessment, to obtain medication management services.  You may also go on Monday through Friday, arrive by 7:00 am for assessments. Contact information: [address] [place]  [address] [phone]        [facility]. Go on 08/05/2024.   Specialty: Professional Counselor Why: Please go to this provider on 08/05/24 at 9:00 am for an assessment, to obtain therapy services. You may also go on Monday through Friday, from 9 am to 1 pm for assessments. Contact information: [address] [place]  [address] [place] [address] [phone]                 Next level of care provider has access to [facility] Link:no  Safety Planning and Suicide Prevention discussed: Yes,  Violette Bihari [phone].     Has patient been referred to the Quitline?: Patient does not use tobacco/nicotine products  Patient has been referred for addiction treatment: No known substance use disorder.  Derick JONELLE Blanch, LCSW 07/29/2024, 8:54 AM

## 2024-07-29 NOTE — Progress Notes (Signed)
 Patient discharged off unit at 0951. Patient belongings reviewed and acknowledged by patient. AVS and Transition Record reviewed and acknowledged by patient. Safety plan completed by patient, reviewed by nurse with patient and copy provided. Any medications and or prescriptions necessary for discharge addressed and provided to patient. Patient denies SI, plan or intent. Denies HI. Denies AVH. No observed or reported side effects to medication. No observed or reported agitation, aggression, or other acute emotional distress. No reported or observed physical abnormalities or concerns. Patient transportation from facility verified and observed.

## 2024-07-29 NOTE — BHH Suicide Risk Assessment (Signed)
 Suicide Risk Assessment  Discharge Assessment    [facility] Discharge Suicide Risk Assessment  Principal Problem: Brief psychotic disorder [facility]) Discharge Diagnoses: Principal Problem:   Brief psychotic disorder (HCC) Active Problems:   Premenstrual dysphoric disorder   Musculoskeletal: Strength & Muscle Tone: within normal limits Gait & Station: normal Patient leans: N/A  Psychiatric Specialty Exam  Presentation  General Appearance:  Appropriate for Environment; Casual  Eye Contact: Good  Speech: Clear and Coherent; Normal Rate  Speech Volume: Normal  Handedness:No data recorded  Mood and Affect  Mood: Euthymic  Duration of Depression Symptoms: No data recorded Affect: Appropriate; Congruent; Full Range   Thought Process  Thought Processes: Coherent; Goal Directed; Linear  Descriptions of Associations:Intact  Orientation:Full (Time, Place and Person)  Thought Content:Logical  History of Schizophrenia/Schizoaffective disorder:No data recorded Duration of Psychotic Symptoms:No data recorded Hallucinations:Hallucinations: None  Ideas of Reference:None  Suicidal Thoughts:Suicidal Thoughts: No  Homicidal Thoughts:Homicidal Thoughts: No   Sensorium  Memory: Immediate Good; Recent Good  Judgment: Good  Insight: Good   Executive Functions  Concentration: Good  Attention Span: Good  Recall: Good  Fund of Knowledge: Good  Language: Good   Psychomotor Activity  Psychomotor Activity: Psychomotor Activity: Normal   Assets  Assets: Communication Skills; Social Support; Desire for Improvement; Physical Health; Housing   Sleep  Sleep: Sleep: Fair  Estimated Sleeping Duration (Last 24 Hours): 5.00-6.25 hours (Due to Daylight Saving Time, the durations displayed may not accurately represent documentation during the time change interval)  Physical Exam: Physical Exam Vitals and nursing note reviewed.  Constitutional:       General: She is not in acute distress.    Appearance: Normal appearance. She is normal weight. She is not ill-appearing.  HENT:     Head: Normocephalic and atraumatic.  Pulmonary:     Effort: Pulmonary effort is normal. No respiratory distress.  Neurological:     General: No focal deficit present.     Mental Status: She is alert and oriented to person, place, and time. Mental status is at baseline.    Review of Systems  Gastrointestinal:  Negative for abdominal pain, constipation, diarrhea, nausea and vomiting.  Neurological:  Negative for dizziness and headaches.  All other systems reviewed and are negative.  Blood pressure 114/88, pulse 80, temperature 98.1 F (36.7 C), temperature source Oral, resp. rate 16, height 5' 2 (1.575 m), weight 61.8 kg, SpO2 100%. Body mass index is 24.92 kg/m.  Mental Status Per Nursing Assessment::   On Admission:  NA  Demographic Factors:  Gay, lesbian, or bisexual orientation  Loss Factors: NA  Historical Factors: NA  Risk Reduction Factors:   Responsible for children under 62 years of age, Sense of responsibility to family, Employed, Living with another person, especially a relative, Positive social support, and Positive coping skills or problem solving skills  Continued Clinical Symptoms:  Previous Psychiatric Diagnoses and Treatments  Cognitive Features That Contribute To Risk:  None    Suicide Risk:  Minimal: No identifiable suicidal ideation.  Patients presenting with no risk factors but with morbid ruminations; may be classified as minimal risk based on the severity of the depressive symptoms   Follow-up Information     [facility]. Go on 08/13/2024.   Specialty: Behavioral Health Why: Please go to this provider on 08/13/24 at 7:00 am for an assessment, to obtain medication management services.  You may also go on Monday through Friday, arrive by 7:00 am for assessments. Contact information: [address] [place]  [address] [phone]        [place] Go on 08/05/2024.   Specialty: Professional Counselor Why: Please go to this provider on 08/05/24 at 9:00 am for an assessment, to obtain therapy services. You may also go on Monday through Friday, from 9 am to 1 pm for assessments. Contact information: [address] [place] [address] [phone]                 Plan Of Care/Follow-up recommendations:   Continue psychiatric medications as prescribed. Follow up with outpatient psychiatric provider and primary care physician as scheduled. Follow-up for abnormal lab results: N/A Follow-up for management of chronic disease: N/A Abstain from or limit alcohol, illicit drugs, and tobacco due to their negative impact on psychiatric and medical health. In the event of worsening symptoms, the patient is instructed to call the national crisis hotline (988), 911, or go the the closest ED for appropriate evaluation and treatment of symptoms.  Activity: as tolerated Diet: Heart healthy   Ashley LOISE Gravely, MD 07/29/2024, 8:22 AM

## 2024-07-29 NOTE — Plan of Care (Signed)
   Problem: Education: Goal: Emotional status will improve Outcome: Progressing   Problem: Education: Goal: Mental status will improve Outcome: Progressing

## 2024-07-29 NOTE — Discharge Summary (Signed)
 Physician Discharge Summary Note  Patient:  Erin Ponce is an 28 y.o., female MRN:  [id number] DOB:  [birth year] Patient phone:  [phone] (home)  Patient address:   [address] 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  Discharge Diagnoses:  Principal Problem:   Brief psychotic disorder [facility]) Active Problems:   Premenstrual dysphoric disorder   Hospital Course:    Upon admission, patient did not appear acutely psychotic. She demonstrated clear, coherent, and logical thought processes and denied hallucinations, delusional ideation, or paranoia. She reported that her first psychotic episode occurred during the postpartum period following the birth of her daughter, raising concern for a history of postpartum psychosis. She endorsed intermittent mild psychotic symptoms in subsequent years and reported a prior brief psychiatric hospitalization in 2019.  She had previously responded well to Risperidone  but experienced elevated prolactin levels during past treatment, though without associated symptoms such as galactorrhea. Given her history of psychotic episodes and past efficacy of Risperidone , the medication was restarted. She tolerated Risperdal  well without adverse effects or EPS.  During hospitalization, her thought processes remained organized, and she denied hallucinations, delusions, suicidal ideation, or homicidal ideation. Despite overall stability, she reported persistent anxiety, impaired sleep, and episodes of sudden irritability. Her Risperidone  dosing was consolidated from 1 mg  twice daily to 2 mg nightly to optimize sedation and support sleep. Propranolol  was initiated at low dose of 10 mg BID for anxiety management. Although her mood improved and remained stable throughout hospitalization, she continued to experience sleep dysregulation. Melatonin was increased to 10 mg nightly for sleep cycle regulation.  Patient described a two-year pattern of cyclical mood symptoms including depression, anxiety, and irritability occurring in the week prior to menstruation and resolving with onset of menses, consistent with Premenstrual Dysphoric Disorder (PMDD). Treatment options were reviewed, and she was encouraged to follow up with her psychiatric provider to consider starting an SSRI such as fluoxetine for use in the premenstrual week.  By discharge, patient had demonstrated significant improvement with no acute depressive or psychotic symptoms. She expressed insight into her conditions and motivation to continue outpatient care, including management of PMDD. She was able to verbalize a safety plan.   Mental Status Exam: Appearance: Casually-dressed, young black female with multiple facial piercings  Behavior: Calm and cooperative  Attitude: Polite, friendly  Speech: Normal rate, rhythm, tone  Mood: Great  Affect: Congruent, appropriate to situation  Thought Process: Linear, logical, goal directed  Thought Content: Does not endorse delusional thought content. Not observed responding to internal stimuli  SI/HI: Denies  Perceptions: Denies AVH  Judgement: Good  Insight: Good  Fund of Knowledge: WNL   Physical Exam  General: Pleasant, well-appearing female. No acute distress. Pulmonary: Normal effort on room air.  Skin: No obvious rash or lesions. Neuro: A&Ox3.No focal deficits. MSK: Normal gait and station.  Review of Systems  No reported symptoms  Blood pressure 114/88, pulse 80, temperature 98.1 F (36.7 C), temperature source Oral, resp. rate 16, height 5' 2  (1.575 m), weight 61.8 kg, SpO2 100%. Body mass index is 24.92 kg/m.  Assets  Assets: Manufacturing Systems Engineer; Social Support; Desire for Improvement; Physical Health; Housing  Social History   Tobacco Use  Smoking Status Never  Smokeless Tobacco Never   Tobacco Cessation:  N/A, patient does not currently use tobacco products  Metabolic Disorder Labs:  Lab Results  Component Value Date   HGBA1C 5.2 07/25/2024   MPG 102.54 07/25/2024   MPG 116.89 10/28/2020   Lab Results  Component Value Date   PROLACTIN 195.0 (H) 11/23/2020   PROLACTIN 160.1 (H) 04/03/2018   Lab Results  Component Value Date   CHOL 137 07/25/2024   TRIG 47 07/25/2024   HDL 37 (L) 07/25/2024   CHOLHDL 3.7 07/25/2024   VLDL 9 07/25/2024   LDLCALC 91 07/25/2024   LDLCALC 87 10/03/2022     Is patient on multiple antipsychotic therapies at discharge:  No   Has Patient had three or more failed trials of antipsychotic monotherapy by history:  No  Recommended Plan for Multiple Antipsychotic Therapies: NA  Discharge Instructions     Diet - low sodium heart healthy   Complete by: As directed    Increase activity slowly   Complete by: As directed       Allergies as of 07/29/2024       Reactions   Latex Itching        Medication List     STOP taking these medications    fluconazole  150 MG tablet Commonly known as: Diflucan    hydrOXYzine  10 MG tablet Commonly known as: ATARAX    metroNIDAZOLE  500 MG tablet Commonly known as: FLAGYL    OLANZapine  5 MG tablet Commonly known as: ZyPREXA        TAKE these medications      Indication  lidocaine  5 % Commonly known as: LIDODERM  Place 1 patch onto the skin daily. Remove & Discard patch within 12 hours or as directed by MD Start taking on: July 30, 2024  Indication: Allodynia   Melatonin 10 MG Tabs Take 10 mg by mouth at bedtime.  Indication: Trouble Sleeping   propranolol  10 MG tablet Commonly known as: INDERAL  Take 1 tablet  (10 mg total) by mouth 2 (two) times daily.  Indication: Feeling Anxious   risperiDONE  2 MG tablet Commonly known as: RISPERDAL  Take 1 tablet (2 mg total) by mouth at bedtime.  Indication: brief psychotic disorder   traZODone  50 MG tablet Commonly known as: DESYREL  Take 1 tablet (50 mg total) by mouth at bedtime as needed for sleep.  Indication: Trouble Sleeping        Follow-up Information     [facility]. Go on 08/13/2024.   Specialty: Behavioral Health Why: Please go to this provider on 08/13/24 at 7:00 am for an assessment, to obtain medication management services.  You may also go on Monday through Friday, arrive by 7:00 am for assessments. Contact information: [address] [place]  [phone]        [place] [facility] Go on 08/05/2024.   Specialty: Professional Counselor Why: Please go to this provider on 08/05/24 at 9:00 am for an assessment, to obtain therapy services. You may also go on Monday through Friday, from 9 am to 1 pm for assessments. Contact information: [address] [place]  [address] [place] [address] [phone]                 Discharge recommendations:   Continue psychiatric medications as prescribed. Follow up with outpatient psychiatric provider and primary care physician as scheduled. Follow-up for abnormal lab results: N/A Follow-up for management of chronic disease: N/A Abstain from  or limit alcohol, illicit drugs, and tobacco due to their negative impact on psychiatric and medical health. In the event of worsening symptoms, the patient is instructed to call the national crisis hotline (988), 911, or go the the closest ED for appropriate evaluation and treatment of symptoms.  Activity: as tolerated Diet: Heart healthy   Ashley LOISE Gravely, MD, PGY-1 07/29/2024, 8:38 AM

## 2024-07-31 NOTE — Progress Notes (Signed)

## 2024-08-05 ENCOUNTER — Telehealth (INDEPENDENT_AMBULATORY_CARE_PROVIDER_SITE_OTHER): Payer: Self-pay | Admitting: Primary Care

## 2024-08-05 NOTE — Telephone Encounter (Signed)
 Called pt to reschedule appt bc pt scheduled for a day the provider will not be in office. Please advise and reschedule if pt call back.

## 2024-08-07 ENCOUNTER — Encounter (INDEPENDENT_AMBULATORY_CARE_PROVIDER_SITE_OTHER): Payer: Self-pay

## 2024-08-07 ENCOUNTER — Ambulatory Visit (INDEPENDENT_AMBULATORY_CARE_PROVIDER_SITE_OTHER): Payer: Self-pay | Admitting: Primary Care

## 2024-08-08 ENCOUNTER — Encounter (INDEPENDENT_AMBULATORY_CARE_PROVIDER_SITE_OTHER): Payer: Self-pay

## 2024-08-08 ENCOUNTER — Ambulatory Visit (INDEPENDENT_AMBULATORY_CARE_PROVIDER_SITE_OTHER): Payer: Self-pay | Admitting: Primary Care

## 2024-08-08 ENCOUNTER — Telehealth (INDEPENDENT_AMBULATORY_CARE_PROVIDER_SITE_OTHER): Payer: Self-pay | Admitting: Primary Care
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
## 2024-08-12 ENCOUNTER — Encounter (INDEPENDENT_AMBULATORY_CARE_PROVIDER_SITE_OTHER): Payer: Self-pay | Admitting: Primary Care

## 2024-08-12 ENCOUNTER — Ambulatory Visit (INDEPENDENT_AMBULATORY_CARE_PROVIDER_SITE_OTHER): Payer: Self-pay | Admitting: Primary Care

## 2024-08-12 VITALS — BP 104/68 | HR 86 | Resp 16 | Ht 62.0 in | Wt 148.2 lb

## 2024-08-12 DIAGNOSIS — S83105D Unspecified dislocation of left knee, subsequent encounter: Secondary | ICD-10-CM

## 2024-08-12 DIAGNOSIS — R7612 Nonspecific reaction to cell mediated immunity measurement of gamma interferon antigen response without active tuberculosis: Secondary | ICD-10-CM

## 2024-08-12 DIAGNOSIS — Z09 Encounter for follow-up examination after completed treatment for conditions other than malignant neoplasm: Secondary | ICD-10-CM

## 2024-08-12 DIAGNOSIS — S43004S Unspecified dislocation of right shoulder joint, sequela: Secondary | ICD-10-CM

## 2024-08-12 DIAGNOSIS — R300 Dysuria: Secondary | ICD-10-CM

## 2024-08-12 NOTE — Progress Notes (Unsigned)
 Subjective:   Erin Ponce is a 28 y.o. female presents for hospital follow up . Admit date to the hospital was 07/23/24, patient was discharged from the hospital on 07/29/24, patient was admitted for: behavioral health issue. Patient feels she is on a roller coaster mix with a horror study .Complex with situation of medication and therapy. Currently has Medicaid Family planning- Erin Ponce clinical nurse and PCP recommended her to go to social services and apply for Medicaid.  Currently she is getting assistance from [facility].  She also complains about bilateral lower extremity edema reviewed medication none that would have side effects.  There is trace edema elevate legs decrease sodium favorite foods are snacks and fries monitor and read labels of foods high in sodium. Patient also has a dislocated right shoulder and right knee that just pops out up joint she is able to most of the time put it back in place.  Suggested to have orthopedics to evaluate.   Patient thought she has an appointment on [facility] for behavioral health at 7 AM asked patient to call before she live due to the predicted weather they will be close in the morning and told her she may come in for her evaluation which is a walk-in on Wednesday at 7 AM. Past Medical History:  Diagnosis Date   Anxiety    Dislocated knee 2008   R knee   Miscarriage      Allergies  Allergen Reactions   Latex Itching    Current Outpatient Medications on File Prior to Visit  Medication Sig Dispense Refill   lidocaine  (LIDODERM ) 5 % Place 1 patch onto the skin daily. Remove & Discard patch within 12 hours or as directed by MD 30 patch 0   melatonin 10 MG TABS Take 10 mg by mouth at bedtime.     propranolol  (INDERAL ) 10 MG tablet Take 1 tablet (10 mg total) by mouth 2 (two) times daily. 60 tablet 0   risperiDONE  (RISPERDAL ) 2 MG tablet Take 1 tablet (2 mg total) by mouth at bedtime. 30 tablet 0   traZODone  (DESYREL ) 50 MG tablet Take 1 tablet (50  mg total) by mouth at bedtime as needed for sleep. 30 tablet 3   [DISCONTINUED] Cetirizine  HCl 10 MG CAPS Take 1 capsule (10 mg total) by mouth daily. 20 capsule 0   No current facility-administered medications on file prior to visit.    Review of System: ROS Comprehensive ROS Pertinent positive and negative noted in HPI   Objective:  BP 104/68   Pulse 86   Resp 16   Ht 5' 2 (1.575 m)   Wt 148 lb 3.2 oz (67.2 kg)   LMP 07/23/2024   SpO2 99%   BMI 27.11 kg/m   Filed Weights   08/12/24 1136  Weight: 148 lb 3.2 oz (67.2 kg)    Physical Exam Vitals reviewed.  Constitutional:      Appearance: Normal appearance.     Comments: Overweight  HENT:     Head: Normocephalic.     Right Ear: Tympanic membrane, ear canal and external ear normal.     Left Ear: Tympanic membrane, ear canal and external ear normal.     Ears:     Comments: Scant amount of bilateral cerumen placed [place]x in both ears for patient to go home and use a washcloth when bathing to clean ears    Nose: Nose normal.     Mouth/Throat:     Mouth: Mucous membranes are moist.  Eyes:     Extraocular Movements: Extraocular movements intact.     Pupils: Pupils are equal, round, and reactive to light.  Cardiovascular:     Rate and Rhythm: Normal rate.  Pulmonary:     Effort: Pulmonary effort is normal.     Breath sounds: Normal breath sounds.  Abdominal:     General: Bowel sounds are normal.     Palpations: Abdomen is soft.  Musculoskeletal:        General: Normal range of motion.     Cervical back: Normal range of motion.     Comments: Shoulder and knee polyp with range of motion  Skin:    General: Skin is warm and dry.  Neurological:     Mental Status: She is alert and oriented to person, place, and time.  Psychiatric:        Mood and Affect: Mood normal.        Behavior: Behavior normal.        Thought Content: Thought content normal.      Assessment:  Erin Ponce was seen today for hospitalization  follow-up.  Diagnoses and all orders for this visit:  Hospital discharge follow-up  Dysuria -     POCT URINALYSIS DIP (CLINITEK)  Dislocated shoulder, right, sequela -     AMB referral to orthopedics  Dislocated knee, left, subsequent encounter -     AMB referral to orthopedics      This note has been created with Education officer, environmental. Any transcriptional errors are unintentional.   No follow-ups on file.  Erin SHAUNNA Bohr, NP 08/18/2024, 7:23 PM

## 2024-08-15 LAB — QUANTIFERON-TB GOLD PLUS
QuantiFERON Mitogen Value: >10 [IU]/mL
QuantiFERON Nil Value: 0.22 [IU]/mL
QuantiFERON TB1 Ag Value: 0.07 [IU]/mL
QuantiFERON TB2 Ag Value: 0.07 [IU]/mL
QuantiFERON-TB Gold Plus: NEGATIVE

## 2024-08-19 ENCOUNTER — Ambulatory Visit (INDEPENDENT_AMBULATORY_CARE_PROVIDER_SITE_OTHER): Payer: Self-pay | Admitting: Primary Care

## 2024-08-19 LAB — POCT URINALYSIS DIP (CLINITEK)
Bilirubin, UA: NEGATIVE
Blood, UA: NEGATIVE
Glucose, UA: NEGATIVE mg/dL
Ketones, POC UA: NEGATIVE mg/dL
Leukocytes, UA: NEGATIVE
Nitrite, UA: NEGATIVE
POC PROTEIN,UA: NEGATIVE
Spec Grav, UA: 1.02 (ref 1.010–1.025)
Urobilinogen, UA: 0.2 U/dL
pH, UA: 7.5 (ref 5.0–8.0)

## 2024-08-21 ENCOUNTER — Other Ambulatory Visit: Payer: Self-pay

## 2024-08-21 ENCOUNTER — Telehealth (INDEPENDENT_AMBULATORY_CARE_PROVIDER_SITE_OTHER): Admitting: Psychiatry

## 2024-08-21 ENCOUNTER — Ambulatory Visit: Payer: Self-pay | Admitting: Physician Assistant

## 2024-08-21 ENCOUNTER — Encounter (HOSPITAL_COMMUNITY): Payer: Self-pay | Admitting: Psychiatry

## 2024-08-21 DIAGNOSIS — F3281 Premenstrual dysphoric disorder: Secondary | ICD-10-CM

## 2024-08-21 DIAGNOSIS — F250 Schizoaffective disorder, bipolar type: Secondary | ICD-10-CM | POA: Diagnosis not present

## 2024-08-21 DIAGNOSIS — F411 Generalized anxiety disorder: Secondary | ICD-10-CM | POA: Diagnosis not present

## 2024-08-21 MED ORDER — TRAZODONE HCL 50 MG PO TABS
50.0000 mg | ORAL_TABLET | Freq: Every evening | ORAL | 3 refills | Status: AC | PRN
  Filled 2024-08-21: qty 30, 30d supply, fill #0

## 2024-08-21 MED ORDER — FLUOXETINE HCL 10 MG PO CAPS
10.0000 mg | ORAL_CAPSULE | Freq: Every day | ORAL | 2 refills | Status: AC
  Filled 2024-08-21: qty 30, 30d supply, fill #0

## 2024-08-21 MED ORDER — MELATONIN 10 MG PO TABS
10.0000 mg | ORAL_TABLET | Freq: Every day | ORAL | 3 refills | Status: AC
  Filled 2024-08-21: qty 30, 30d supply, fill #0

## 2024-08-21 MED ORDER — PROPRANOLOL HCL 10 MG PO TABS
10.0000 mg | ORAL_TABLET | Freq: Two times a day (BID) | ORAL | 3 refills | Status: AC
  Filled 2024-08-21: qty 60, 30d supply, fill #0

## 2024-08-21 MED ORDER — RISPERIDONE 2 MG PO TABS
2.0000 mg | ORAL_TABLET | Freq: Every day | ORAL | 3 refills | Status: AC
  Filled 2024-08-21: qty 30, 30d supply, fill #0

## 2024-08-21 NOTE — Progress Notes (Signed)
 [facility] MD/PA/NP OP Progress Note Virtual Visit via Video Note  I connected with Erin Ponce on 08/21/2024 at  8:30 AM EST by a video enabled telemedicine application and verified that I am speaking with the correct person using two identifiers.  Location: Patient: Home Provider: Clinic   I discussed the limitations of evaluation and management by telemedicine and the availability of in person appointments. The patient expressed understanding and agreed to proceed.  I provided 30 minutes of non-face-to-face time during this encounter.       08/21/2024 8:43 AM Erin Ponce  MRN:  [id number]  Chief Complaint: I feel like I am being watched and followed  HPI: 28 year old female seen today for follow up psychiatric evaluation.   She has a psychiatric history of brief psychotic disorder, depression, and psychosis. She was recently hospitalized 07/23/2024-07/29/2024. Per chart review patient complained of psychosis, including reported paranoia, delusions, tangential speech, disorganized thought content, low mood, and anxiety related to job stress.   She was started on Trazodone  50 mg nightly as needed, propranolol  10 mg twice daily, Risperdal  2 mg nightly, and Melatonin 10 mg nightly as needed. She reports her medications are somewhat effective in managing her psychiatric conditions.     Today was well-groomed, pleasant, cooperative, engaged in conversation.  She informed clinical research associate that at times she feels that people are following her.  She notes that she has thoughts that the government might be in her phone.  Patient notes that when something bad happens she finds that it is her fault.  She notes that recently her uncle passed away and notes that she believed it was her fault.  She reports that she is in denial that he is really deceased as she did not see his body but instead an urn.  At times patient notes that she continues to have mood swings.  She informed clinical research associate that it is worse during  her menstrual cycle.  She reports that her primary care doctor recommended Prozac  to help manage PMDD.  Patient informed clinical research associate that she was let go from her job.  She notes that she has put in several applications to other positions and is hopeful that she will receive 1\one.  Patient also informed writer that she and her significant other have issues because of her mood swings.  Patient notes that the above exacerbates her anxiety and depression.Today provider conducted a GAD-7 and patient scored an14.  Provider also conducted PHQ-9 and patient scored a 14.  She notes that her sleep has improved.  When on a higher dose of Risperdal  in the past patient dealt with galactorrhea.  She however notes that she is not experiencing this.  Provider informed patient that Risperdal  can be increased in the future if needed or a mood stabilizer could be tried.  Today patient agreeable to starting Prozac  10 mg to help manage mood, anxiety, and depression.  She will continue her other medications as prescribed. Potential side effects of medication and risks vs benefits of treatment vs non-treatment were explained and discussed. All questions were answered.start no other concerns at this time.    Visit Diagnosis:    ICD-10-CM   1. Schizoaffective disorder, bipolar type (HCC)  F25.0 risperiDONE  (RISPERDAL ) 2 MG tablet    2. Premenstrual dysphoric disorder  F32.81 FLUoxetine  (PROZAC ) 10 MG capsule    3. Generalized anxiety disorder  F41.1 traZODone  (DESYREL ) 50 MG tablet    risperiDONE  (RISPERDAL ) 2 MG tablet    Melatonin 10 MG TABS  propranolol  (INDERAL ) 10 MG tablet        Past Psychiatric History: brief psychotic disorder, depression, and psychosis  Past Medical History:  Past Medical History:  Diagnosis Date   Anxiety    Dislocated knee 2008   R knee   Miscarriage     Past Surgical History:  Procedure Laterality Date   NO PAST SURGERIES      Family Psychiatric History: Denies  Family  History:  Family History  Problem Relation Age of Onset   Diabetes Mother    Thyroid disease Mother     Social History:  Social History   Socioeconomic History   Marital status: Significant Other    Spouse name: Not on file   Number of children: 1   Years of education: 12   Highest education level: Associate degree: academic program  Occupational History   Occupation: Home health aide  Tobacco Use   Smoking status: Never   Smokeless tobacco: Never  Vaping Use   Vaping status: Never Used  Substance and Sexual Activity   Alcohol use: No   Drug use: Yes    Types: Marijuana    Comment: recently stopped   Sexual activity: Yes    Birth control/protection: None  Other Topics Concern   Not on file  Social History Narrative   Not on file   Social Drivers of Health   Tobacco Use: Low Risk (08/21/2024)   Patient History    Smoking Tobacco Use: Never    Smokeless Tobacco Use: Never    Passive Exposure: Not on file  Financial Resource Strain: High Risk (08/03/2024)   Overall Financial Resource Strain (CARDIA)    Difficulty of Paying Living Expenses: Very hard  Food Insecurity: Food Insecurity Present (08/03/2024)   Epic    Worried About Programme Researcher, Broadcasting/film/video in the Last Year: Often true    Ran Out of Food in the Last Year: Sometimes true  Transportation Needs: Unmet Transportation Needs (08/03/2024)   Epic    Lack of Transportation (Medical): Yes    Lack of Transportation (Non-Medical): Yes  Physical Activity: Sufficiently Active (08/03/2024)   Exercise Vital Sign    Days of Exercise per Week: 7 days    Minutes of Exercise per Session: 150+ min  Stress: Stress Concern Present (08/03/2024)   [employer] of Occupational Health - Occupational Stress Questionnaire    Feeling of Stress: To some extent  Social Connections: Socially Integrated (08/03/2024)   Social Connection and Isolation Panel    Frequency of Communication with Friends and Family: More than three times  a week    Frequency of Social Gatherings with Friends and Family: Once a week    Attends Religious Services: 1 to 4 times per year    Active Member of Clubs or Organizations: Yes    Attends Banker Meetings: Never    Marital Status: Living with partner  Depression (PHQ2-9): High Risk (08/21/2024)   Depression (PHQ2-9)    PHQ-2 Score: 14  Alcohol Screen: Low Risk (08/03/2024)   Alcohol Screen    Last Alcohol Screening Score (AUDIT): 1  Housing: High Risk (08/03/2024)   Epic    Unable to Pay for Housing in the Last Year: Yes    Number of Times Moved in the Last Year: 0    Homeless in the Last Year: No  Utilities: Not At Risk (07/23/2024)   Epic    Threatened with loss of utilities: No  Health Literacy: Adequate Health Literacy (05/23/2023)  B1300 Health Literacy    Frequency of need for help with medical instructions: Never    Allergies:  Allergies  Allergen Reactions   Latex Itching    Metabolic Disorder Labs: Lab Results  Component Value Date   HGBA1C 5.2 07/25/2024   MPG 102.54 07/25/2024   MPG 116.89 10/28/2020   Lab Results  Component Value Date   PROLACTIN 124.0 (H) 07/28/2024   PROLACTIN 195.0 (H) 11/23/2020   Lab Results  Component Value Date   CHOL 137 07/25/2024   TRIG 47 07/25/2024   HDL 37 (L) 07/25/2024   CHOLHDL 3.7 07/25/2024   VLDL 9 07/25/2024   LDLCALC 91 07/25/2024   LDLCALC 87 10/03/2022   Lab Results  Component Value Date   TSH 0.688 07/25/2024   TSH 0.935 10/28/2020    Therapeutic Level Labs: No results found for: LITHIUM No results found for: VALPROATE No results found for: CBMZ  Current Medications: Current Outpatient Medications  Medication Sig Dispense Refill   FLUoxetine  (PROZAC ) 10 MG capsule Take 1 capsule (10 mg total) by mouth daily. 30 capsule 2   lidocaine  (LIDODERM ) 5 % Place 1 patch onto the skin daily. Remove & Discard patch within 12 hours or as directed by MD 30 patch 0   Melatonin 10 MG TABS  Take 10 mg by mouth at bedtime. 30 tablet 3   propranolol  (INDERAL ) 10 MG tablet Take 1 tablet (10 mg total) by mouth 2 (two) times daily. 60 tablet 3   risperiDONE  (RISPERDAL ) 2 MG tablet Take 1 tablet (2 mg total) by mouth at bedtime. 30 tablet 3   traZODone  (DESYREL ) 50 MG tablet Take 1 tablet (50 mg total) by mouth at bedtime as needed for sleep. 30 tablet 3   No current facility-administered medications for this visit.     Musculoskeletal: Strength & Muscle Tone: within normal limits and telehealth visit Gait & Station: normal, telehealth visit Patient leans: N/A  Psychiatric Specialty Exam: Review of Systems  Last menstrual period 07/23/2024.There is no height or weight on file to calculate BMI.  General Appearance: Well Groomed  Eye Contact:  Good  Speech:  Clear and Coherent and Normal Rate  Volume:  Normal  Mood:  Anxious and Depressed  Affect:  Appropriate and Congruent  Thought Process:  Coherent, Goal Directed, and Linear  Orientation:  Full (Time, Place, and Person)  Thought Content: Logical and Paranoid Ideation   Suicidal Thoughts:  Yes.  without intent/plan  Homicidal Thoughts:  No  Memory:  Immediate;   Good Recent;   Good Remote;   Good  Judgement:  Good  Insight:  Good  Psychomotor Activity:  Normal  Concentration:  Concentration: Good and Attention Span: Good  Recall:  Good  Fund of Knowledge: Good  Language: Good  Akathisia:  No  Handed:  Right  AIMS (if indicated): not done  Assets:  Communication Skills Desire for Improvement Financial Resources/Insurance Housing Leisure Time Physical Health Social Support Vocational/Educational  ADL's:  Intact  Cognition: WNL  Sleep:  Good   Screenings: AIMS    Flowsheet Row [facility]  AIMS Total Score 0 0   AUDIT    Flowsheet Row  [facility] Ad[facility] ([facility] ([facility]  Alcohol  Use Disorder Identification Test Final Score (AUDIT) 0 0 0   GAD-7    Flowsheet Row Video Visit from 08/21/2024 in [facility] Office Visit from 02/05/2024 in [facility] Office Visit from 01/23/2024 in [facility] Video Visit from 10/25/2023 in [facility] Video Visit from 01/11/2023 in [facility]  Total GAD-7 Score 14 0 11 16 0   PHQ2-9    Flowsheet Row Video Visit from 08/21/2024 in [facility] Office Visit from 02/05/2024 in [facility] Office Visit from 01/23/2024 in [facility] Video Visit from 10/25/2023 in [facility] Office Visit from 05/23/2023 in [facility]  PHQ-2 Total Score 4 0 4 6 0  PHQ-9 Total Score 14 0 17 27 0   Flowsheet Row Video Visit from 08/21/2024 in [facility] Admission (Discharged[facility] ED from 07/22/2024 in [facility] Emergency Department [facility]  C-SSRS RISK CATEGORY Error: Q7 should not be populated when Q6 is No No Risk No Risk     Assessment and Plan: Since her hospitalization she notes that her anxiety and depression continues to be well-managed.  She also notes that she is no longer experiencing hallucination.  She does note that she has mood swings and paranoia. When on a higher dose of Risperdal  in the past patient dealt with galactorrhea.  She however notes that she is not experiencing this.  Provider informed patient  that Risperdal  can be increased in the future if needed or a mood stabilizer could be tried.  Today patient agreeable to starting Prozac  10 mg to help manage mood (PMDD), anxiety, and depression.  She will continue her other medications as prescribed.   1. Schizoaffective disorder, bipolar type (HCC) (Primary)  Continue- risperiDONE  (RISPERDAL ) 2 MG tablet; Take 1 tablet (2 mg total) by mouth at bedtime.  Dispense: 30 tablet; Refill: 3  2. Premenstrual dysphoric disorder  Start- FLUoxetine  (PROZAC ) 10 MG capsule; Take 1 capsule (10 mg total) by mouth daily.  Dispense: 30 capsule; Refill: 2  3. Generalized anxiety disorder  Continue- traZODone  (DESYREL ) 50 MG tablet; Take 1 tablet (50 mg total) by mouth at bedtime as needed for sleep.  Dispense: 30 tablet; Refill: 3 Continue- risperiDONE  (RISPERDAL ) 2 MG tablet; Take 1 tablet (2 mg total) by mouth at bedtime.  Dispense: 30 tablet; Refill: 3 Continue- Melatonin 10 MG TABS; Take 10 mg by mouth at bedtime.  Dispense: 30 tablet; Refill: 3 Continue- propranolol  (INDERAL ) 10 MG tablet; Take 1 tablet (10 mg total) by mouth 2 (two) times daily.  Dispense: 60 tablet; Refill: 3  Follow-up in 2 months needed    Zane FORBES Bach, NP 08/21/2024, 8:43 AM

## 2024-09-17 ENCOUNTER — Ambulatory Visit: Payer: Self-pay | Admitting: Physician Assistant

## 2024-10-22 ENCOUNTER — Ambulatory Visit: Payer: Self-pay | Admitting: Physician Assistant

## 2024-10-30 ENCOUNTER — Telehealth (HOSPITAL_COMMUNITY): Admitting: Psychiatry
